# Patient Record
Sex: Female | Born: 1995 | Race: Black or African American | Hispanic: No | Marital: Single | Smoking: Former smoker
Health system: Southern US, Community
[De-identification: ages and names within clinical notes are randomized; demographics above are authoritative.]

## PROBLEM LIST (undated history)

## (undated) ENCOUNTER — Inpatient Hospital Stay (HOSPITAL_COMMUNITY): Payer: Self-pay

## (undated) DIAGNOSIS — S060X9A Concussion with loss of consciousness of unspecified duration, initial encounter: Secondary | ICD-10-CM

## (undated) DIAGNOSIS — E282 Polycystic ovarian syndrome: Secondary | ICD-10-CM

## (undated) DIAGNOSIS — E669 Obesity, unspecified: Secondary | ICD-10-CM

## (undated) DIAGNOSIS — S060XAA Concussion with loss of consciousness status unknown, initial encounter: Secondary | ICD-10-CM

## (undated) DIAGNOSIS — F419 Anxiety disorder, unspecified: Secondary | ICD-10-CM

## (undated) DIAGNOSIS — B9689 Other specified bacterial agents as the cause of diseases classified elsewhere: Secondary | ICD-10-CM

## (undated) DIAGNOSIS — S0291XA Unspecified fracture of skull, initial encounter for closed fracture: Secondary | ICD-10-CM

## (undated) DIAGNOSIS — F32A Depression, unspecified: Secondary | ICD-10-CM

## (undated) DIAGNOSIS — N390 Urinary tract infection, site not specified: Secondary | ICD-10-CM

## (undated) DIAGNOSIS — N760 Acute vaginitis: Secondary | ICD-10-CM

## (undated) DIAGNOSIS — F329 Major depressive disorder, single episode, unspecified: Secondary | ICD-10-CM

## (undated) DIAGNOSIS — F410 Panic disorder [episodic paroxysmal anxiety] without agoraphobia: Secondary | ICD-10-CM

## (undated) DIAGNOSIS — R51 Headache: Secondary | ICD-10-CM

## (undated) HISTORY — DX: Anxiety disorder, unspecified: F41.9

## (undated) HISTORY — PX: FRACTURE SURGERY: SHX138

## (undated) HISTORY — DX: Obesity, unspecified: E66.9

## (undated) HISTORY — DX: Headache: R51

---
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
## 2013-01-12 ENCOUNTER — Emergency Department (HOSPITAL_COMMUNITY)
Admission: EM | Admit: 2013-01-12 | Discharge: 2013-01-12 | Disposition: A | Discharge status: Home / Self Care | Payer: Self-pay | Attending: Emergency Medicine | Admitting: Emergency Medicine

## 2013-01-12 ENCOUNTER — Emergency Department (HOSPITAL_COMMUNITY): Payer: Self-pay

## 2013-01-12 ENCOUNTER — Encounter (HOSPITAL_COMMUNITY): Payer: Self-pay

## 2013-01-12 DIAGNOSIS — Z87440 Personal history of urinary (tract) infections: Secondary | ICD-10-CM | POA: Insufficient documentation

## 2013-01-12 DIAGNOSIS — Z8659 Personal history of other mental and behavioral disorders: Secondary | ICD-10-CM | POA: Insufficient documentation

## 2013-01-12 DIAGNOSIS — J45909 Unspecified asthma, uncomplicated: Secondary | ICD-10-CM | POA: Insufficient documentation

## 2013-01-12 DIAGNOSIS — A499 Bacterial infection, unspecified: Secondary | ICD-10-CM | POA: Insufficient documentation

## 2013-01-12 DIAGNOSIS — B9689 Other specified bacterial agents as the cause of diseases classified elsewhere: Secondary | ICD-10-CM | POA: Insufficient documentation

## 2013-01-12 DIAGNOSIS — R102 Pelvic and perineal pain unspecified side: Secondary | ICD-10-CM

## 2013-01-12 DIAGNOSIS — N949 Unspecified condition associated with female genital organs and menstrual cycle: Secondary | ICD-10-CM | POA: Insufficient documentation

## 2013-01-12 DIAGNOSIS — E669 Obesity, unspecified: Secondary | ICD-10-CM | POA: Insufficient documentation

## 2013-01-12 DIAGNOSIS — Z3202 Encounter for pregnancy test, result negative: Secondary | ICD-10-CM | POA: Insufficient documentation

## 2013-01-12 DIAGNOSIS — R112 Nausea with vomiting, unspecified: Secondary | ICD-10-CM | POA: Insufficient documentation

## 2013-01-12 DIAGNOSIS — N760 Acute vaginitis: Secondary | ICD-10-CM

## 2013-01-12 DIAGNOSIS — F17200 Nicotine dependence, unspecified, uncomplicated: Secondary | ICD-10-CM | POA: Insufficient documentation

## 2013-01-12 LAB — CBC WITH DIFFERENTIAL/PLATELET
Basophils Absolute: 0 10*3/uL (ref 0.0–0.1)
Lymphocytes Relative: 36 % (ref 12–46)
Lymphs Abs: 1.9 10*3/uL (ref 0.7–4.0)
Neutro Abs: 3 10*3/uL (ref 1.7–7.7)
Neutrophils Relative %: 56 % (ref 43–77)
Platelets: 273 10*3/uL (ref 150–400)
RBC: 4.59 MIL/uL (ref 3.87–5.11)
WBC: 5.4 10*3/uL (ref 4.0–10.5)

## 2013-01-12 LAB — WET PREP, GENITAL: Trich, Wet Prep: NONE SEEN

## 2013-01-12 LAB — POCT I-STAT, CHEM 8
BUN: 17 mg/dL (ref 6–23)
Chloride: 110 mEq/L (ref 96–112)
HCT: 43 % (ref 36.0–46.0)
Sodium: 143 mEq/L (ref 135–145)
TCO2: 21 mmol/L (ref 0–100)

## 2013-01-12 LAB — URINE MICROSCOPIC-ADD ON

## 2013-01-12 LAB — URINALYSIS, ROUTINE W REFLEX MICROSCOPIC
Glucose, UA: NEGATIVE mg/dL
Leukocytes, UA: NEGATIVE
Nitrite: NEGATIVE
Protein, ur: 30 mg/dL — AB
pH: 5.5 (ref 5.0–8.0)

## 2013-01-12 LAB — POCT PREGNANCY, URINE: Preg Test, Ur: NEGATIVE

## 2013-01-12 MED ORDER — METRONIDAZOLE 500 MG PO TABS: 500.0000 mg | ORAL_TABLET | Freq: Two times a day (BID) | ORAL | Status: DC

## 2013-01-12 NOTE — ED Notes (Signed)
Pt c/o RLQ radiating to rt lower back x44month with n/v off and on, pt states "I just don't feel right"

## 2013-01-12 NOTE — ED Provider Notes (Signed)
History    CSN: [id number] Arrival date & time 01/12/13  1208  First MD Initiated Contact with Patient 01/12/13 1342     Chief Complaint  Patient presents with  . Abdominal Pain  . Nausea  . Emesis   (Consider location/radiation/quality/duration/timing/severity/associated sxs/prior Treatment) HPI Comments: Patient presents with right-sided pelvic pain. She states it's been gone for about 2 or 3 weeks. It's been intermittent and has been gradually worsening. She's had some intermittent nausea and vomiting and says she just doesn't feel very good. She denies any vaginal bleeding or discharge. She states she had similar symptoms in the past when she was pregnant but hasn't had them since then. She denies any urinary symptoms. She denies any fevers or chills. She describes it as an achy pain across her lower abdomen more on the right pelvic area.  Past Medical History  Diagnosis Date  . UTI (lower urinary tract infection)   . Asthma   . Obese   . Anxiety   . NWGNFAOZ(308.6)    Past Surgical History  Procedure Laterality Date  . Cesarean section    . Hernia repair    . Fracture surgery      thumb   Family History  Problem Relation Age of Onset  . Diabetes Mother   . Migraines Mother   . Heart murmur Brother   . Cancer Paternal Grandfather    History  Substance Use Topics  . Smoking status: Current Some Day Smoker    Types: Cigars  . Smokeless tobacco: Never Used  . Alcohol Use: No     Comment: Occasional   OB History   Grav Para Term Preterm Abortions TAB SAB Ect Mult Living   2 1             Review of Systems  Constitutional: Negative for fever, chills, diaphoresis and fatigue.  HENT: Negative for congestion, rhinorrhea and sneezing.   Eyes: Negative.   Respiratory: Negative for cough, chest tightness and shortness of breath.   Cardiovascular: Negative for chest pain and leg swelling.  Gastrointestinal: Positive for nausea and abdominal pain. Negative for  vomiting, diarrhea and blood in stool.  Genitourinary: Positive for pelvic pain. Negative for frequency, hematuria, flank pain and difficulty urinating.  Musculoskeletal: Negative for back pain and arthralgias.  Skin: Negative for rash.  Neurological: Negative for dizziness, speech difficulty, weakness, numbness and headaches.    Allergies  Review of patient's allergies indicates no known allergies.  Home Medications   Current Outpatient Rx  Name  Route  Sig  Dispense  Refill  . albuterol (PROVENTIL HFA;VENTOLIN HFA) 108 (90 BASE) MCG/ACT inhaler   Inhalation   Inhale 2 puffs into the lungs every 6 (six) hours as needed. Shortness of breath         . etonogestrel (IMPLANON) 68 MG IMPL implant   Subcutaneous   Inject 1 each into the skin once.         Marland Kitchen ibuprofen (ADVIL,MOTRIN) 200 MG tablet   Oral   Take 400 mg by mouth every 6 (six) hours as needed for pain.         . metroNIDAZOLE (FLAGYL) 500 MG tablet   Oral   Take 1 tablet (500 mg total) by mouth 2 (two) times daily. One po bid x 7 days   14 tablet   0    BP 120/74  Pulse 72  Temp(Src) 98.5 F (36.9 C) (Oral)  Resp 16  SpO2 100% Physical Exam  Constitutional:  She is oriented to person, place, and time. She appears well-developed and well-nourished.  HENT:  Head: Normocephalic and atraumatic.  Eyes: Pupils are equal, round, and reactive to light.  Neck: Normal range of motion. Neck supple.  Cardiovascular: Normal rate, regular rhythm and normal heart sounds.   Pulmonary/Chest: Effort normal and breath sounds normal. No respiratory distress. She has no wheezes. She has no rales. She exhibits no tenderness.  Abdominal: Soft. Bowel sounds are normal. There is tenderness (Mild tenderness in the right lower pelvic area. There's no pain over McBurney's point.). There is no rebound and no guarding.  Genitourinary:  Patient with moderate amount of thick white discharge. There is no cervical motion tenderness. There  some mild right adnexal tenderness.  Musculoskeletal: Normal range of motion. She exhibits no edema.  Lymphadenopathy:    She has no cervical adenopathy.  Neurological: She is alert and oriented to person, place, and time.  Skin: Skin is warm and dry. No rash noted.  Psychiatric: She has a normal mood and affect.    ED Course  Procedures (including critical care time) Results for orders placed during the hospital encounter of 01/12/13  WET PREP, GENITAL      Result Value Range   Yeast Wet Prep HPF POC NONE SEEN  NONE SEEN   Trich, Wet Prep NONE SEEN  NONE SEEN   Clue Cells Wet Prep HPF POC MANY (*) NONE SEEN   WBC, Wet Prep HPF POC MODERATE (*) NONE SEEN  CBC WITH DIFFERENTIAL      Result Value Range   WBC 5.4  4.0 - 10.5 K/uL   RBC 4.59  3.87 - 5.11 MIL/uL   Hemoglobin 13.6  12.0 - 15.0 g/dL   HCT 81.1  91.4 - 78.2 %   MCV 90.6  78.0 - 100.0 fL   MCH 29.6  26.0 - 34.0 pg   MCHC 32.7  30.0 - 36.0 g/dL   RDW 95.6  21.3 - 08.6 %   Platelets 273  150 - 400 K/uL   Neutrophils Relative % 56  43 - 77 %   Neutro Abs 3.0  1.7 - 7.7 K/uL   Lymphocytes Relative 36  12 - 46 %   Lymphs Abs 1.9  0.7 - 4.0 K/uL   Monocytes Relative 6  3 - 12 %   Monocytes Absolute 0.3  0.1 - 1.0 K/uL   Eosinophils Relative 2  0 - 5 %   Eosinophils Absolute 0.1  0.0 - 0.7 K/uL   Basophils Relative 1  0 - 1 %   Basophils Absolute 0.0  0.0 - 0.1 K/uL  URINALYSIS, ROUTINE W REFLEX MICROSCOPIC      Result Value Range   Color, Urine YELLOW  YELLOW   APPearance CLEAR  CLEAR   Specific Gravity, Urine 1.031 (*) 1.005 - 1.030   pH 5.5  5.0 - 8.0   Glucose, UA NEGATIVE  NEGATIVE mg/dL   Hgb urine dipstick TRACE (*) NEGATIVE   Bilirubin Urine NEGATIVE  NEGATIVE   Ketones, ur NEGATIVE  NEGATIVE mg/dL   Protein, ur 30 (*) NEGATIVE mg/dL   Urobilinogen, UA 0.2  0.0 - 1.0 mg/dL   Nitrite NEGATIVE  NEGATIVE   Leukocytes, UA NEGATIVE  NEGATIVE  URINE MICROSCOPIC-ADD ON      Result Value Range   Squamous  Epithelial / LPF FEW (*) RARE   RBC / HPF 0-2  <3 RBC/hpf   Bacteria, UA FEW (*) RARE   Urine-Other MUCOUS PRESENT  POCT I-STAT, CHEM 8      Result Value Range   Sodium 143  135 - 145 mEq/L   Potassium 4.2  3.5 - 5.1 mEq/L   Chloride 110  96 - 112 mEq/L   BUN 17  6 - 23 mg/dL   Creatinine, Ser 1.61  0.50 - 1.10 mg/dL   Glucose, Bld 096 (*) 70 - 99 mg/dL   Calcium, Ion 0.45  4.09 - 1.23 mmol/L   TCO2 21  0 - 100 mmol/L   Hemoglobin 14.6  12.0 - 15.0 g/dL   HCT 81.1  91.4 - 78.2 %  POCT PREGNANCY, URINE      Result Value Range   Preg Test, Ur NEGATIVE  NEGATIVE   No results found.   US Transvaginal Non-ob  01/12/2013   *RADIOLOGY REPORT*  Clinical Data:  Right pelvic pain.  Clinical concern for ovarian torsion.  TRANSABDOMINAL AND TRANSVAGINAL ULTRASOUND OF PELVIS DOPPLER ULTRASOUND OF OVARIES  Technique:  Both transabdominal and transvaginal ultrasound examinations of the pelvis were performed. Transabdominal technique was performed for global imaging of the pelvis including uterus, ovaries, adnexal regions, and pelvic cul-de-sac.  It was necessary to proceed with endovaginal exam following the transabdominal exam to visualize the left ovary and better visualize the uterus and right ovary.  Color and duplex Doppler ultrasound was utilized to evaluate blood flow to the ovaries.  Comparison: Obstetrical ultrasound dated 07/05/2011.  Findings:  Uterus:  Normal in size and appearance  Endometrium: Normal in size and appearance  Right ovary: Normal appearance/no adnexal mass  Left ovary: Normal appearance/no adnexal mass  Pulsed Doppler evaluation demonstrates normal low-resistance arterial and venous waveforms in both ovaries.  IMPRESSION:  Normal examination.  No sonographic evidence for ovarian torsion.   Original Report Authenticated By: Beckie Salts, M.D.   US Pelvis Complete  01/12/2013   *RADIOLOGY REPORT*  Clinical Data:  Right pelvic pain.  Clinical concern for ovarian torsion.   TRANSABDOMINAL AND TRANSVAGINAL ULTRASOUND OF PELVIS DOPPLER ULTRASOUND OF OVARIES  Technique:  Both transabdominal and transvaginal ultrasound examinations of the pelvis were performed. Transabdominal technique was performed for global imaging of the pelvis including uterus, ovaries, adnexal regions, and pelvic cul-de-sac.  It was necessary to proceed with endovaginal exam following the transabdominal exam to visualize the left ovary and better visualize the uterus and right ovary.  Color and duplex Doppler ultrasound was utilized to evaluate blood flow to the ovaries.  Comparison: Obstetrical ultrasound dated 07/05/2011.  Findings:  Uterus:  Normal in size and appearance  Endometrium: Normal in size and appearance  Right ovary: Normal appearance/no adnexal mass  Left ovary: Normal appearance/no adnexal mass  Pulsed Doppler evaluation demonstrates normal low-resistance arterial and venous waveforms in both ovaries.  IMPRESSION:  Normal examination.  No sonographic evidence for ovarian torsion.   Original Report Authenticated By: Beckie Salts, M.D.   [place] Art/ven Flow Abd Pelv Doppler  01/12/2013   *RADIOLOGY REPORT*  Clinical Data:  Right pelvic pain.  Clinical concern for ovarian torsion.  TRANSABDOMINAL AND TRANSVAGINAL ULTRASOUND OF PELVIS DOPPLER ULTRASOUND OF OVARIES  Technique:  Both transabdominal and transvaginal ultrasound examinations of the pelvis were performed. Transabdominal technique was performed for global imaging of the pelvis including uterus, ovaries, adnexal regions, and pelvic cul-de-sac.  It was necessary to proceed with endovaginal exam following the transabdominal exam to visualize the left ovary and better visualize the uterus and right ovary.  Color and duplex Doppler ultrasound was utilized to evaluate blood flow to  the ovaries.  Comparison: Obstetrical ultrasound dated 07/05/2011.  Findings:  Uterus:  Normal in size and appearance  Endometrium: Normal in size and appearance  Right ovary:  Normal appearance/no adnexal mass  Left ovary: Normal appearance/no adnexal mass  Pulsed Doppler evaluation demonstrates normal low-resistance arterial and venous waveforms in both ovaries.  IMPRESSION:  Normal examination.  No sonographic evidence for ovarian torsion.   Original Report Authenticated By: Beckie Salts, M.D.   1. Pelvic pain   2. Bacterial vaginosis     MDM  There is no evidence of ovarian torsion or cyst. She was discharged in good condition. She was given a prescription for Flagyl for the bacterial vaginosis. She was advised to followup with the [facility] outpatient center.  Rolan Bucco, MD 01/12/13 1540

## 2013-01-13 LAB — GC/CHLAMYDIA PROBE AMP
CT Probe RNA: NEGATIVE
GC Probe RNA: NEGATIVE

## 2013-06-05 ENCOUNTER — Encounter (HOSPITAL_COMMUNITY): Payer: Self-pay | Admitting: Emergency Medicine

## 2013-06-05 ENCOUNTER — Emergency Department (HOSPITAL_COMMUNITY)
Admission: EM | Admit: 2013-06-05 | Discharge: 2013-06-05 | Disposition: A | Discharge status: Home / Self Care | Payer: Medicaid Other | Attending: Emergency Medicine | Admitting: Emergency Medicine

## 2013-06-05 DIAGNOSIS — F411 Generalized anxiety disorder: Secondary | ICD-10-CM | POA: Insufficient documentation

## 2013-06-05 DIAGNOSIS — R110 Nausea: Secondary | ICD-10-CM | POA: Insufficient documentation

## 2013-06-05 DIAGNOSIS — J45909 Unspecified asthma, uncomplicated: Secondary | ICD-10-CM | POA: Insufficient documentation

## 2013-06-05 DIAGNOSIS — E669 Obesity, unspecified: Secondary | ICD-10-CM | POA: Insufficient documentation

## 2013-06-05 DIAGNOSIS — R1032 Left lower quadrant pain: Secondary | ICD-10-CM | POA: Insufficient documentation

## 2013-06-05 DIAGNOSIS — Z87440 Personal history of urinary (tract) infections: Secondary | ICD-10-CM | POA: Insufficient documentation

## 2013-06-05 DIAGNOSIS — Z3202 Encounter for pregnancy test, result negative: Secondary | ICD-10-CM | POA: Insufficient documentation

## 2013-06-05 DIAGNOSIS — F17200 Nicotine dependence, unspecified, uncomplicated: Secondary | ICD-10-CM | POA: Insufficient documentation

## 2013-06-05 LAB — URINALYSIS, ROUTINE W REFLEX MICROSCOPIC
Bilirubin Urine: NEGATIVE
Glucose, UA: NEGATIVE mg/dL
Ketones, ur: NEGATIVE mg/dL
Nitrite: NEGATIVE
Protein, ur: NEGATIVE mg/dL
pH: 7.5 (ref 5.0–8.0)

## 2013-06-05 MED ORDER — ONDANSETRON 4 MG PO TBDP: 4.0000 mg | ORAL_TABLET | Freq: Three times a day (TID) | ORAL | Status: DC | PRN

## 2013-06-05 MED ORDER — TRAMADOL HCL 50 MG PO TABS: 50.0000 mg | ORAL_TABLET | Freq: Four times a day (QID) | ORAL | Status: DC | PRN

## 2013-06-05 NOTE — ED Notes (Signed)
She c/o llq area abd. Pain for ~ 1 1/2 weeks, which is made worse with activity.  She further tells me she has had some minimal vag. "spotting".  She states she has implant contraception.

## 2013-06-05 NOTE — ED Provider Notes (Signed)
Medical screening examination/treatment/procedure(s) were performed by non-physician practitioner and as supervising physician I was immediately available for consultation/collaboration.  EKG Interpretation   None         Karel Mowers L Angelos Wasco, MD 06/05/13 1457 

## 2013-06-05 NOTE — ED Provider Notes (Signed)
CSN: [id number]     Arrival date & time 06/05/13  0831 History   First MD Initiated Contact with Patient 06/05/13 [phone]     Chief Complaint  Patient presents with  . Abdominal Pain   (Consider location/radiation/quality/duration/timing/severity/associated sxs/prior Treatment) The history is provided by the patient and medical records.   This is a 17 year old female with no significant past medical history, presenting to the ED for left lower abdominal wall and groin pain for the past week.  States it feels like a "soreness" and radiates to her left anterior thigh. 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 She denies any specific injury but states sx are worse with these activities.  She denies any urinary symptoms or vaginal complaints. Patient does note a few days ago she had some menstrual "spotting".  Pt is currently has implanon and states it regulates her cycles very well.  Notes some intermittent nausea as well, no vomiting.  States it seems to improve once she eats.  No sick contacts, fevers, sweats, or chills.  Past Medical History  Diagnosis Date  . UTI (lower urinary tract infection)   . Asthma   . Obese   . Anxiety   . [id number](875.6)    Past Surgical History  Procedure Laterality Date  . Cesarean section    . Hernia repair    . Fracture surgery      thumb   Family History  Problem Relation Age of Onset  . Diabetes Mother   . Migraines Mother   . Heart murmur Brother   . Cancer Paternal Grandfather    History  Substance Use Topics  . Smoking status: Current Some Day Smoker    Types: Cigars  . Smokeless tobacco: Never Used  . Alcohol Use: No     Comment: Occasional   OB History   Grav Para Term Preterm Abortions TAB SAB Ect Mult Living   2 1             Review of Systems  Gastrointestinal: Positive for abdominal pain.  All other systems reviewed and are negative.    Allergies    Review of patient's allergies indicates no known allergies.  Home Medications   Current Outpatient Rx  Name  Route  Sig  Dispense  Refill  . albuterol (PROVENTIL HFA;VENTOLIN HFA) 108 (90 BASE) MCG/ACT inhaler   Inhalation   Inhale 2 puffs into the lungs every 6 (six) hours as needed. Shortness of breath         . aspirin-acetaminophen-caffeine (EXCEDRIN MIGRAINE) 250-250-65 MG per tablet   Oral   Take 1 tablet by mouth every 6 (six) hours as needed for headache.         . etonogestrel (IMPLANON) 68 MG IMPL implant   Subcutaneous   Inject 1 each into the skin once.         Marland Kitchen ibuprofen (ADVIL,MOTRIN) 200 MG tablet   Oral   Take 400 mg by mouth every 6 (six) hours as needed for pain.         Marland Kitchen ondansetron (ZOFRAN ODT) 4 MG disintegrating tablet   Oral   Take 1 tablet (4 mg total) by mouth every 8 (eight) hours as needed for nausea.   10 tablet   0   . traMADol (ULTRAM) 50 MG tablet   Oral   Take 1 tablet (50 mg total) by mouth every 6 (six) hours  as needed.   15 tablet   0    BP 127/69  Pulse 77  Temp(Src) 98.4 F (36.9 C)  Resp 16  SpO2 99%  Physical Exam  Nursing note and vitals reviewed. Constitutional: She is oriented to person, place, and time. She appears well-developed and well-nourished. No distress.  HENT:  Head: Normocephalic and atraumatic.  Mouth/Throat: Oropharynx is clear and moist.  Eyes: Conjunctivae and EOM are normal. Pupils are equal, round, and reactive to light.  Neck: Normal range of motion. Neck supple.  Cardiovascular: Normal rate, regular rhythm and normal heart sounds.   Pulmonary/Chest: Effort normal and breath sounds normal. No respiratory distress. She has no wheezes.  Abdominal: Soft. Bowel sounds are normal. No hernia. Hernia confirmed negative in the left inguinal area.    TTP along left lower abdominal wall and groin with extension into left anterior thigh; no hernia or masses; no lymphadenopathy; no redness or cellulitis   Musculoskeletal: Normal range of motion. She exhibits no edema.  Lymphadenopathy:       Left: No inguinal adenopathy present.  Neurological: She is alert and oriented to person, place, and time.  Skin: Skin is warm and dry. She is not diaphoretic.  Psychiatric: She has a normal mood and affect.    ED Course  Procedures (including critical care time) Labs Review Labs Reviewed  URINALYSIS, ROUTINE W REFLEX MICROSCOPIC  POCT PREGNANCY, URINE   Imaging Review No results found.  EKG Interpretation   None       MDM   1. Abdominal wall pain in left lower quadrant    Pt with pain along left lower abdomen/groin area.  No hernia or lymphadenopathy felt.  No urinary sx or pelvic complaints. u/a without signs of infection.  Intermittent nausea without clear etiology.  Sx possibly due to muscle strain from repetitive lifting and prolonged walking at work.  Long discussion with pt about further work-up including lab work +/- pelvic exam, pt elected to treat sx and will FU if symptoms persist or worsen.  Rx tramadol and zofran.  Discussed plan with pt, she agreed.  Return precautions advised.  Garlon Hatchet, PA-C 06/05/13 1226

## 2013-06-30 ENCOUNTER — Emergency Department (HOSPITAL_COMMUNITY)
Admission: EM | Admit: 2013-06-30 | Discharge: 2013-06-30 | Disposition: A | Discharge status: Home / Self Care | Payer: Medicaid Other | Attending: Emergency Medicine | Admitting: Emergency Medicine

## 2013-06-30 ENCOUNTER — Encounter (HOSPITAL_COMMUNITY): Payer: Self-pay | Admitting: Emergency Medicine

## 2013-06-30 DIAGNOSIS — F17200 Nicotine dependence, unspecified, uncomplicated: Secondary | ICD-10-CM | POA: Insufficient documentation

## 2013-06-30 DIAGNOSIS — F411 Generalized anxiety disorder: Secondary | ICD-10-CM | POA: Insufficient documentation

## 2013-06-30 DIAGNOSIS — J45901 Unspecified asthma with (acute) exacerbation: Secondary | ICD-10-CM | POA: Insufficient documentation

## 2013-06-30 DIAGNOSIS — E669 Obesity, unspecified: Secondary | ICD-10-CM | POA: Insufficient documentation

## 2013-06-30 DIAGNOSIS — Z79899 Other long term (current) drug therapy: Secondary | ICD-10-CM | POA: Insufficient documentation

## 2013-06-30 DIAGNOSIS — B9789 Other viral agents as the cause of diseases classified elsewhere: Secondary | ICD-10-CM | POA: Insufficient documentation

## 2013-06-30 DIAGNOSIS — Z87440 Personal history of urinary (tract) infections: Secondary | ICD-10-CM | POA: Insufficient documentation

## 2013-06-30 DIAGNOSIS — R112 Nausea with vomiting, unspecified: Secondary | ICD-10-CM | POA: Insufficient documentation

## 2013-06-30 DIAGNOSIS — J45909 Unspecified asthma, uncomplicated: Secondary | ICD-10-CM

## 2013-06-30 DIAGNOSIS — J069 Acute upper respiratory infection, unspecified: Secondary | ICD-10-CM | POA: Insufficient documentation

## 2013-06-30 DIAGNOSIS — R197 Diarrhea, unspecified: Secondary | ICD-10-CM | POA: Insufficient documentation

## 2013-06-30 DIAGNOSIS — B349 Viral infection, unspecified: Secondary | ICD-10-CM

## 2013-06-30 HISTORY — DX: Panic disorder (episodic paroxysmal anxiety): F41.0

## 2013-06-30 MED ORDER — PROMETHAZINE HCL 25 MG PO TABS: 25.0000 mg | ORAL_TABLET | Freq: Four times a day (QID) | ORAL | Status: DC | PRN

## 2013-06-30 MED ORDER — ALBUTEROL SULFATE HFA 108 (90 BASE) MCG/ACT IN AERS: 2.0000 | INHALATION_SPRAY | RESPIRATORY_TRACT | Status: DC | PRN

## 2013-06-30 MED ORDER — DIPHENOXYLATE-ATROPINE 2.5-0.025 MG PO TABS: 2.0000 | ORAL_TABLET | Freq: Four times a day (QID) | ORAL | Status: DC | PRN

## 2013-06-30 MED ORDER — PREDNISONE 20 MG PO TABS: ORAL_TABLET | ORAL | Status: DC

## 2013-06-30 MED ORDER — ALBUTEROL SULFATE HFA 108 (90 BASE) MCG/ACT IN AERS
2.0000 | INHALATION_SPRAY | Freq: Once | RESPIRATORY_TRACT | Status: AC
  Administered 2013-06-30: 2 via RESPIRATORY_TRACT
  Filled 2013-06-30: qty 6.7

## 2013-06-30 NOTE — ED Provider Notes (Signed)
CSN: [id number]     Arrival date & time 06/30/13  4098 History   First MD Initiated Contact with Patient 06/30/13 0700     Chief Complaint  Patient presents with  . Shortness of Breath   (Consider location/radiation/quality/duration/timing/severity/associated sxs/prior Treatment) HPI Comments: Patient has been sick for several days with sinus congestion and cough. She has had nausea with one episode of emesis and has had multiple episodes of diarrhea. Patient reports that her entire family is sick with similar symptoms. This morning, she started having wheezing and could not find her inhaler. She does have a history of asthma.  Patient reports a history of anxiety and panic attack. She has been experiencing increased anxiety this morning. She does not take medication for this. Symptoms are now improving.  Patient is a 17 y.o. female presenting with shortness of breath.  Shortness of Breath Associated symptoms: cough and wheezing     Past Medical History  Diagnosis Date  . UTI (lower urinary tract infection)   . Asthma   . Obese   . Anxiety   . Headache(784.0)   . Panic    Past Surgical History  Procedure Laterality Date  . Cesarean section    . Hernia repair    . Fracture surgery      thumb   Family History  Problem Relation Age of Onset  . Diabetes Mother   . Migraines Mother   . Heart murmur Brother   . Cancer Paternal Grandfather    History  Substance Use Topics  . Smoking status: Current Some Day Smoker    Types: Cigars  . Smokeless tobacco: Never Used  . Alcohol Use: No     Comment: Occasional   OB History   Grav Para Term Preterm Abortions TAB SAB Ect Mult Living   2 1             Review of Systems  HENT: Positive for sinus pressure.   Respiratory: Positive for cough, shortness of breath and wheezing.   All other systems reviewed and are negative.    Allergies  Review of patient's allergies indicates no known allergies.  Home Medications    Current Outpatient Rx  Name  Route  Sig  Dispense  Refill  . albuterol (PROVENTIL HFA;VENTOLIN HFA) 108 (90 BASE) MCG/ACT inhaler   Inhalation   Inhale 2 puffs into the lungs every 6 (six) hours as needed. Shortness of breath         . aspirin-acetaminophen-caffeine (EXCEDRIN MIGRAINE) 250-250-65 MG per tablet   Oral   Take 1 tablet by mouth every 6 (six) hours as needed for headache.         . etonogestrel (IMPLANON) 68 MG IMPL implant   Subcutaneous   Inject 1 each into the skin once.         Marland Kitchen ibuprofen (ADVIL,MOTRIN) 200 MG tablet   Oral   Take 400 mg by mouth every 6 (six) hours as needed for pain.         Marland Kitchen ondansetron (ZOFRAN ODT) 4 MG disintegrating tablet   Oral   Take 1 tablet (4 mg total) by mouth every 8 (eight) hours as needed for nausea.   10 tablet   0   . traMADol (ULTRAM) 50 MG tablet   Oral   Take 1 tablet (50 mg total) by mouth every 6 (six) hours as needed.   15 tablet   0    BP 146/88  Pulse 88  Temp(Src) 98 F (  36.7 C) (Oral)  Resp 18  Ht 5\' 8"  (1.727 m)  Wt 196 lb (88.905 kg)  BMI 29.81 kg/m2  SpO2 98% Physical Exam  Constitutional: She is oriented to person, place, and time. She appears well-developed and well-nourished. No distress.  HENT:  Head: Normocephalic and atraumatic.  Right Ear: Hearing normal.  Left Ear: Hearing normal.  Nose: Mucosal edema and rhinorrhea present. Right sinus exhibits maxillary sinus tenderness. Left sinus exhibits maxillary sinus tenderness.  Mouth/Throat: Oropharynx is clear and moist and mucous membranes are normal.  Eyes: Conjunctivae and EOM are normal. Pupils are equal, round, and reactive to light.  Neck: Normal range of motion. Neck supple.  Cardiovascular: Regular rhythm, S1 normal and S2 normal.  Exam reveals no gallop and no friction rub.   No murmur heard. Pulmonary/Chest: Effort normal and breath sounds normal. No respiratory distress. She exhibits no tenderness.  Abdominal: Soft. Normal  appearance and bowel sounds are normal. There is no hepatosplenomegaly. There is no tenderness. There is no rebound, no guarding, no tenderness at McBurney's point and negative Murphy's sign. No hernia.  Musculoskeletal: Normal range of motion.  Neurological: She is alert and oriented to person, place, and time. She has normal strength. No cranial nerve deficit or sensory deficit. Coordination normal. GCS eye subscore is 4. GCS verbal subscore is 5. GCS motor subscore is 6.  Skin: Skin is warm, dry and intact. No rash noted. No cyanosis.  Psychiatric: Her speech is normal and behavior is normal. Thought content normal. Her mood appears anxious.    ED Course  Procedures (including critical care time) Labs Review Labs Reviewed - No data to display Imaging Review No results found.  EKG Interpretation   None       MDM   1. URI (upper respiratory infection)   2. Viral syndrome   3. Asthma   4.  Anxiety  Patient presents to the year of multiple complaints. Patient's main complaint is shortness of breath and she does have a history of asthma. She had slight wheezing at arrival, but this resolved after albuterol. Patient has some sinus and upper respiratory infection symptoms. This is likely exacerbating the asthma. She has been without her Heller, has been provided 1. Patient has had viral syndrome symptoms including nausea, vomiting and diarrhea. She has multiple sick contacts at home with similar symptoms. Patient treated symptomatically. It is nontender to any dehydration. This have a history of anxiety and initially was very anxious on arrival, but has come down without intervention.    Gilda Crease, MD 06/30/13 [phone]

## 2013-06-30 NOTE — ED Notes (Signed)
Pt c/o difficulty breathing onset last night, pt states she has been out of her MDI for a few days. Pt tearful, very anxious. Pt also c/o emesis x 1 and multiple episodes of diarrhea.

## 2014-03-16 ENCOUNTER — Encounter (HOSPITAL_COMMUNITY): Payer: Self-pay | Admitting: Emergency Medicine

## 2014-03-16 ENCOUNTER — Emergency Department (HOSPITAL_COMMUNITY)
Admission: EM | Admit: 2014-03-16 | Discharge: 2014-03-16 | Disposition: A | Discharge status: Home / Self Care | Payer: Medicaid Other | Attending: Emergency Medicine | Admitting: Emergency Medicine

## 2014-03-16 DIAGNOSIS — Z79899 Other long term (current) drug therapy: Secondary | ICD-10-CM | POA: Insufficient documentation

## 2014-03-16 DIAGNOSIS — R079 Chest pain, unspecified: Secondary | ICD-10-CM | POA: Insufficient documentation

## 2014-03-16 DIAGNOSIS — R42 Dizziness and giddiness: Secondary | ICD-10-CM | POA: Insufficient documentation

## 2014-03-16 DIAGNOSIS — E669 Obesity, unspecified: Secondary | ICD-10-CM | POA: Insufficient documentation

## 2014-03-16 DIAGNOSIS — Z3202 Encounter for pregnancy test, result negative: Secondary | ICD-10-CM | POA: Insufficient documentation

## 2014-03-16 DIAGNOSIS — K209 Esophagitis, unspecified without bleeding: Secondary | ICD-10-CM | POA: Insufficient documentation

## 2014-03-16 DIAGNOSIS — J45909 Unspecified asthma, uncomplicated: Secondary | ICD-10-CM | POA: Insufficient documentation

## 2014-03-16 DIAGNOSIS — Z87891 Personal history of nicotine dependence: Secondary | ICD-10-CM | POA: Insufficient documentation

## 2014-03-16 DIAGNOSIS — F410 Panic disorder [episodic paroxysmal anxiety] without agoraphobia: Secondary | ICD-10-CM | POA: Insufficient documentation

## 2014-03-16 DIAGNOSIS — Z8669 Personal history of other diseases of the nervous system and sense organs: Secondary | ICD-10-CM | POA: Insufficient documentation

## 2014-03-16 DIAGNOSIS — IMO0002 Reserved for concepts with insufficient information to code with codable children: Secondary | ICD-10-CM | POA: Insufficient documentation

## 2014-03-16 DIAGNOSIS — Z87440 Personal history of urinary (tract) infections: Secondary | ICD-10-CM | POA: Insufficient documentation

## 2014-03-16 LAB — CBC
HCT: 40.6 % (ref 36.0–46.0)
Hemoglobin: 13.2 g/dL (ref 12.0–15.0)
MCH: 30.2 pg (ref 26.0–34.0)
MCHC: 32.5 g/dL (ref 30.0–36.0)
MCV: 92.9 fL (ref 78.0–100.0)
PLATELETS: 246 10*3/uL (ref 150–400)
RBC: 4.37 MIL/uL (ref 3.87–5.11)
RDW: 12 % (ref 11.5–15.5)
WBC: 6.3 10*3/uL (ref 4.0–10.5)

## 2014-03-16 LAB — BASIC METABOLIC PANEL
ANION GAP: 12 (ref 5–15)
BUN: 9 mg/dL (ref 6–23)
CALCIUM: 9.6 mg/dL (ref 8.4–10.5)
CO2: 24 meq/L (ref 19–32)
Chloride: 102 mEq/L (ref 96–112)
Creatinine, Ser: 0.93 mg/dL (ref 0.50–1.10)
GFR, EST NON AFRICAN AMERICAN: 88 mL/min — AB (ref >90)
Glucose, Bld: 81 mg/dL (ref 70–99)
Potassium: 3.9 mEq/L (ref 3.7–5.3)
SODIUM: 138 meq/L (ref 137–147)

## 2014-03-16 LAB — POC URINE PREG, ED: PREG TEST UR: NEGATIVE

## 2014-03-16 LAB — I-STAT TROPONIN, ED: TROPONIN I, POC: 0 ng/mL (ref 0.00–0.08)

## 2014-03-16 MED ORDER — GI COCKTAIL ~~LOC~~
30.0000 mL | Freq: Once | ORAL | Status: AC
  Administered 2014-03-16: 30 mL via ORAL
  Filled 2014-03-16: qty 30

## 2014-03-16 NOTE — ED Provider Notes (Signed)
CSN: [id number]     Arrival date & time 03/16/14  1508 History   First MD Initiated Contact with Patient 03/16/14 1805     Chief Complaint  Patient presents with  . Chest Pain  . Dizziness  . Nausea   (Consider location/radiation/quality/duration/timing/severity/associated sxs/prior Treatment) HPI  Jodi Daniel is a 18 yo female with PMH: asthma, migraines, presenting to the ED with chest pain x 3 days.  Pt states pain is intermittent, comes on gradually and points to directly below the sternal notch and notes it radiates up to throat.  She reports some nausea and 1 episode of vomiting 2 days ago that has resolved.  She describes the pain as a pressure and rates it as 4/10.  She denies fever, chills, abd pain, diarrhea, cough or shortness of breath.  Past Medical History  Diagnosis Date  . UTI (lower urinary tract infection)   . Asthma   . Obese   . Anxiety   . Headache(784.0)   . Panic    Past Surgical History  Procedure Laterality Date  . Cesarean section    . Hernia repair    . Fracture surgery      thumb   Family History  Problem Relation Age of Onset  . Diabetes Mother   . Migraines Mother   . Heart murmur Brother   . Cancer Paternal Grandfather    History  Substance Use Topics  . Smoking status: Former Smoker    Types: Cigars  . Smokeless tobacco: Never Used  . Alcohol Use: Yes     Comment: Occasional   OB History   Grav Para Term Preterm Abortions TAB SAB Ect Mult Living   2 1             Review of Systems  Constitutional: Negative for fever and chills.  HENT: Negative for sore throat.   Respiratory: Negative for cough and shortness of breath.   Cardiovascular: Negative for chest pain and leg swelling.  Gastrointestinal: Negative for nausea, vomiting, abdominal pain and diarrhea.  Genitourinary: Negative for dysuria.  Musculoskeletal: Negative for myalgias.  Skin: Negative for rash.  Neurological: Negative for weakness, numbness and headaches.       Allergies  Review of patient's allergies indicates no known allergies.  Home Medications   Prior to Admission medications   Medication Sig Start Date End Date Taking? Authorizing Provider  albuterol (PROVENTIL HFA;VENTOLIN HFA) 108 (90 BASE) MCG/ACT inhaler Inhale 2 puffs into the lungs every 6 (six) hours as needed. Shortness of breath    Historical Provider, MD  albuterol (PROVENTIL HFA;VENTOLIN HFA) 108 (90 BASE) MCG/ACT inhaler Inhale 2 puffs into the lungs every 4 (four) hours as needed for wheezing or shortness of breath. 06/30/13   Gilda Crease, MD  aspirin-acetaminophen-caffeine (EXCEDRIN MIGRAINE) [phone] MG per tablet Take 1 tablet by mouth every 6 (six) hours as needed for headache.    Historical Provider, MD  diphenoxylate-atropine (LOMOTIL) 2.5-0.025 MG per tablet Take 2 tablets by mouth 4 (four) times daily as needed for diarrhea or loose stools. 06/30/13   Gilda Crease, MD  etonogestrel (IMPLANON) 68 MG IMPL implant Inject 1 each into the skin once.    Historical Provider, MD  ibuprofen (ADVIL,MOTRIN) 200 MG tablet Take 400 mg by mouth every 6 (six) hours as needed for pain.    Historical Provider, MD  ondansetron (ZOFRAN ODT) 4 MG disintegrating tablet Take 1 tablet (4 mg total) by mouth every 8 (eight) hours as needed for  nausea. 06/05/13   Garlon Hatchet, PA-C  predniSONE (DELTASONE) 20 MG tablet 3 tabs po day one, then 2 tabs daily x 4 days 06/30/13   Gilda Crease, MD  promethazine (PHENERGAN) 25 MG tablet Take 1 tablet (25 mg total) by mouth every 6 (six) hours as needed for nausea or vomiting. 06/30/13   Gilda Crease, MD  traMADol (ULTRAM) 50 MG tablet Take 1 tablet (50 mg total) by mouth every 6 (six) hours as needed. 06/05/13   Garlon Hatchet, PA-C   BP 112/74  Pulse 79  Temp(Src) 98.4 F (36.9 C) (Oral)  Resp 19  SpO2 100%  LMP 02/14/2014 Physical Exam  Nursing note and vitals reviewed. Constitutional: She appears  well-developed and well-nourished. No distress.  HENT:  Head: Normocephalic and atraumatic.  Mouth/Throat: Oropharynx is clear and moist. No oropharyngeal exudate.  Eyes: Conjunctivae are normal.  Neck: Neck supple. No thyromegaly present.  Cardiovascular: Normal rate, regular rhythm and intact distal pulses.   Pulmonary/Chest: Effort normal and breath sounds normal. No respiratory distress. She has no wheezes. She has no rales. She exhibits tenderness.    Abdominal: Soft. There is no tenderness.  Musculoskeletal: She exhibits no tenderness.  Lymphadenopathy:    She has no cervical adenopathy.  Neurological: She is alert. Coordination normal.  Skin: Skin is warm and dry. No rash noted. She is not diaphoretic.  Psychiatric: She has a normal mood and affect.    ED Course  Procedures (including critical care time) Labs Review Labs Reviewed  BASIC METABOLIC PANEL - Abnormal; Notable for the following:    GFR calc non Af Amer 88 (*)    All other components within normal limits  CBC  I-STAT TROPOININ, ED  POC URINE PREG, ED   Imaging Review No results found.   EKG Interpretation   Date/Time:  Thursday March 16 2014 15:20:03 EDT Ventricular Rate:  78 PR Interval:  157 QRS Duration: 79 QT Interval:  378 QTC Calculation: 430 R Axis:   82 Text Interpretation:  Sinus rhythm Borderline T wave abnormalities ECG  OTHERWISE WITHIN NORMAL LIMITS No old tracing to compare Confirmed by  MILLER  MD, BRIAN ([id number]) on 03/16/2014 3:30:00 PM      MDM   Final diagnoses:  Esophagitis   Pt's pain does not appear to be cardiac or pulmonary in nature based on presentation.  Pt's pain resolved with GI cocktail.  Her vital signs are normal, no tracheal deviation, no respiratory distress, EKG without acute abnormalities, negative troponin.  Discharge instructions include symptomatic treatment at home and follow-up with her primary care provider.  Discharge precautions provided and  Pt  appears reliable for follow up and is agreeable to discharge.   Filed Vitals:   03/16/14 1529 03/16/14 1758 03/16/14 1943  BP: 123/72 112/74 120/71  Pulse: 81 79 71  Temp: 98.4 F (36.9 C)    TempSrc: Oral    Resp: SpO2: 100% 100% 99%   Meds given in ED:  Medications  gi cocktail (Maalox,Lidocaine,Donnatal) (30 mLs Oral Given 03/16/14 1923)    Discharge Medication List as of 03/16/2014  7:37 PM      Harle Battiest, NP 03/17/14 0100  Harle Battiest, NP 03/17/14 0104

## 2014-03-16 NOTE — ED Notes (Signed)
Pt presents with c/o centralized chest pain that started approx 3 days ago. Pt reports dizziness with the pain as well as some nausea. Pt says she has vomited approx 2 times over the last few days.

## 2014-03-16 NOTE — Discharge Instructions (Signed)
Please follow the directions provided.  Please establish care with a primary care provider to follow this pain and manage your asthma.  You may take over the counter antacid medicine if this pain recurs.     SEEK IMMEDIATE MEDICAL ATTENTION IF: You develop a fever.  Your chest pains become severe or intolerable.  You develop new, unexplained symptoms (problems).  You develop shortness of breath, nausea, vomiting, sweating or feel light headed.  You develop a new cough or you cough up blood.   Emergency Department Resource Guide 1) Find a Doctor and Pay Out of Pocket Although you won't have to find out who is covered by your insurance plan, it is a good idea to ask around and get recommendations. You will then need to call the office and see if the doctor you have chosen will accept you as a new patient and what types of options they offer for patients who are self-pay. Some doctors offer discounts or will set up payment plans for their patients who do not have insurance, but you will need to ask so you aren't surprised when you get to your appointment.  2) Contact Your Local Health Department Not all health departments have doctors that can see patients for sick visits, but many do, so it is worth a call to see if yours does. If you don't know where your local health department is, you can check in your phone book. The CDC also has a tool to help you locate your state's health department, and many state websites also have listings of all of their local health departments.  3) Find a Walk-in Clinic If your illness is not likely to be very severe or complicated, you may want to try a walk in clinic. These are popping up all over the country in pharmacies, drugstores, and shopping centers. They're usually staffed by nurse practitioners or physician assistants that have been trained to treat common illnesses and complaints. They're usually fairly quick and inexpensive. However, if you have serious  medical issues or chronic medical problems, these are probably not your best option.  No Primary Care Doctor: - Call Health Connect at  [phone] - they can help you locate a primary care doctor that  accepts your insurance, provides certain services, etc. - Physician Referral Service- [phone]  Chronic Pain Problems: Organization         Address  Phone   Notes  [facility]  [phone] Patients need to be referred by their primary care doctor.   Medication Assistance: Organization         Address  Phone   Notes  [facility] Medication [facility] [address] [place] [place], [place] [address] [phone] --Must be a resident of [facility] -- Must have NO insurance coverage whatsoever (no Medicaid/ Medicare, etc.) -- The pt. MUST have a primary care doctor that directs their care regularly and follows them in the community   [facility]  [phone]   [employer]  [phone]    Agencies that provide inexpensive medical care: Organization         Address  Phone   Notes  [facility]  [phone]   [facility]    [phone]   [facility] [address] [place], [place] [address] [phone]   [facility] [address] [place] [place] [phone]   Planned Parenthood    [phone])  [phone]   [facility]    [phone]   [facility]  [address], [place] Phone:  [phone], Fax:  [phone] Hours of Operation:  9 am - 6 pm, M-F.  Also accepts Medicaid/Medicare and self-pay.  [facility]  [address], [place] Phone: [phone], Fax: [phone]. Hours of Operation:  8:30 am - 5:30 pm, M-F.  Also accepts Medicaid and self-pay.  [facility] [place] [address] [place] Phone: [phone]   [facility] [address] Natasha Bence  [place], [place] [phone], Ext. [phone] Mondays & Thursdays: 7-9 AM.  First 15 patients are seen on a first come, first serve basis.    Medicaid-accepting [facility] Providers:  Organization         Address  Phone   Notes  [facility] [address], [place] [phone] Also accepts self-pay patients.  [facility] [address] Laurell Josephs [place], [place]  [phone]   [facility] [address], [place] [phone]   [facility] [address] [place] [phone]   Renaye Rakers [address], [place]   [phone] Only accepts [place] Access [place] patients after they have their name applied to their card.   Self-Pay (no insurance) in [facility]:  Organization         Address  Phone   Notes  Sickle Cell Patients, [facility] [address] [place], [place] [phone]   [facility] [address] [place], [place] [phone]   [facility] [place]  [address], [place] [phone]   [facility]/Dr. Osei-Bonsu  [address], [place] or [address], [place] [phone] Phone number for both [place] and [place] locations is the same.  [facility] [address], [place] [phone]   [facility] [address], [place] or [address] [phone]   [facility] [address] [place] [phone], phone; [phone], fax Sees patients 1st and 3rd Saturday of every month.  Must not qualify for public or private insurance (i.e. Medicaid, Medicare, [place] Health Choice, Veterans' Benefits)  Household income should be no more than 200% of the poverty level The clinic cannot treat you if you are pregnant or think you are pregnant  Sexually  transmitted diseases are not treated at the clinic.    Dental Care: Organization         Address  Phone  Notes  [facility] [address] [place], [place] [phone] Accepts children up to age 29 who are enrolled in [place] or [place] Health Choice; pregnant women with a Medicaid card; and children who have applied for Medicaid or [place] Health Choice, but were declined, whose parents can pay a reduced fee at time of service.  [facility]  [address], [place] [phone] Accepts children up to age 40 who are enrolled in [place] or [place] Health Choice; pregnant women with a Medicaid card; and children who have applied for Medicaid or [place] Health Choice, but were declined, whose parents can pay a reduced  fee at time of service.  [facility]  [address] [place], [place] [phone] Patients are seen by appointment only. Walk-ins are not accepted. [facility] will see patients 32 years of age and older. Monday - Tuesday (8am-5pm) Most Wednesdays (8:30-5pm) $30 per visit, cash only  [facility]  [address], [facility] [phone] Patients are seen by appointment only. Walk-ins are not accepted. [facility] will see patients 62 years of age and older. One Wednesday Evening (Monthly: Volunteer Based).  $30 per visit, cash only  [employer]  [phone] for adults; Children under age 49, call Graduate Pediatric Dentistry at [phone]. Children aged 82-14, please call [phone] to request a pediatric application.  Dental services are provided in all areas of dental care including fillings, crowns and bridges, complete and partial dentures, implants, gum treatment, root canals, and extractions. Preventive care is also provided. Treatment is provided to both adults and children. Patients are  selected via a lottery and there is often a waiting list.   [facility] [address], [place]  [phone] [email or web address]   [facility] [address] [place], [place] [phone], Ext. [phone] Second and Fourth Thursday of each month, opens at 6:30 AM; Clinic ends at 9 AM.  Patients are seen on a first-come first-served basis, and a limited number are seen during each clinic.   [facility]  [address] Ether Griffins [place], [place] [phone]   Eligibility Requirements You must have lived in [place], [place], or [place] counties for at least the last three months.   You cannot be eligible for state or federal sponsored [place], including [employer], [place], or [employer].   You generally cannot be eligible for healthcare insurance through your employer.    How to apply: Eligibility screenings are held every Tuesday and Wednesday afternoon from 1:00 pm until 4:00 pm. You do not need an appointment for the interview!  [facility] [address], [place], [place] [phone]   [facility] Health Department  [phone]   [facility] Health Department  [phone]   [facility] Health Department  [phone]    Behavioral Health Resources in the Community: Intensive Outpatient Programs Organization         Address  Phone  Notes  [facility] [address], [place], [place] [phone]   [facility] Outpatient [address], [place], [place] [phone]   [facility] [address] [place], [place]  [phone]   [facility] [address]  [place], [place] [phone] or [phone]   Substance Abuse Resources Organization         Address  Phone  Notes  [facility]  [phone]   [facility]  [phone]   [facility]  [phone]   Floydene Flock  [phone]     Residential & Outpatient Substance Abuse Program  [phone]   Psychological Services Organization         Address  Phone  Notes  [facility] Behavioral Health  [phone]   [facility]  [phone]   [facility] [address] [place] [phone] or [phone]    Mobile Crisis Teams Organization         Address  Phone  Notes  [facility], Mobile Crisis Care Unit  [phone]  [facility]  [address] [place], [place]   [facility] [address] [place] [phone]    Self-Help/Support Groups Organization         Address  Phone             Notes  [facility] [place] - variety of support groups  [place] Call for more information  Narcotics Anonymous (NA), [facility] [address], [employer] [place]  2 meetings at this location   Special educational needs teacher         Address  Phone  Notes  [facility] [place],    [place]  [phone]   [facility]  [address] [place] [phone], [place], [place]   [place], [place] [phone] Admissions: 8am-3pm M-F  Incentives Substance [place] [address],    [place], [place] [phone]   [facility] [address] [place], [place], [place]   [facility] [address]  [place], [place]   [facility] - Intensive Outpatient [address] Kristeen Mans [address], [place], [place]   [facility] ([place] [place]  [place], [place] [phone] or [phone]   [facility] ([facility]) [address], [place], [place] Accepts Medicaid  [facility] [address]  [place] [phone] Substance Abuse/Addiction Treatment   [facility] Organization         Address  Phone  Notes  [facility]  [phone]   Domenic Schwab, PhD [address] Arlis Porta [place], [place]   [phone] or [phone]   [place], [place] [phone]   [facility] [address] [place], [place] [phone] Insurance/Medicaid/sponsorship through [facility] and Families [address]                                    [place], [place] [phone] [place] [address], [place] [phone]    Dr. Adele Schilder  [phone]   [facility] Dept. 1) [address], [place] 2) [place] 3)  [place] [address], [place] [phone]  [phone]   [facility] [phone] or [phone] (After Hours)

## 2014-03-17 NOTE — ED Provider Notes (Signed)
Medical screening examination/treatment/procedure(s) were performed by non-physician practitioner and as supervising physician I was immediately available for consultation/collaboration.    Vida Roller, MD 03/17/14 1106

## 2014-05-15 ENCOUNTER — Encounter (HOSPITAL_COMMUNITY): Payer: Self-pay | Admitting: Emergency Medicine

## 2014-06-23 ENCOUNTER — Emergency Department (HOSPITAL_COMMUNITY)
Admission: EM | Admit: 2014-06-23 | Discharge: 2014-06-24 | Disposition: A | Discharge status: Home / Self Care | Payer: Medicaid Other | Attending: Emergency Medicine | Admitting: Emergency Medicine

## 2014-06-23 ENCOUNTER — Encounter (HOSPITAL_COMMUNITY): Payer: Self-pay | Admitting: Emergency Medicine

## 2014-06-23 DIAGNOSIS — M546 Pain in thoracic spine: Secondary | ICD-10-CM

## 2014-06-23 DIAGNOSIS — Y92410 Unspecified street and highway as the place of occurrence of the external cause: Secondary | ICD-10-CM | POA: Insufficient documentation

## 2014-06-23 DIAGNOSIS — M542 Cervicalgia: Secondary | ICD-10-CM

## 2014-06-23 DIAGNOSIS — Z79899 Other long term (current) drug therapy: Secondary | ICD-10-CM | POA: Insufficient documentation

## 2014-06-23 DIAGNOSIS — Y998 Other external cause status: Secondary | ICD-10-CM | POA: Insufficient documentation

## 2014-06-23 DIAGNOSIS — E669 Obesity, unspecified: Secondary | ICD-10-CM | POA: Insufficient documentation

## 2014-06-23 DIAGNOSIS — R0781 Pleurodynia: Secondary | ICD-10-CM

## 2014-06-23 DIAGNOSIS — S0990XA Unspecified injury of head, initial encounter: Secondary | ICD-10-CM | POA: Insufficient documentation

## 2014-06-23 DIAGNOSIS — J45909 Unspecified asthma, uncomplicated: Secondary | ICD-10-CM | POA: Insufficient documentation

## 2014-06-23 DIAGNOSIS — Z87440 Personal history of urinary (tract) infections: Secondary | ICD-10-CM | POA: Insufficient documentation

## 2014-06-23 DIAGNOSIS — R519 Headache, unspecified: Secondary | ICD-10-CM

## 2014-06-23 DIAGNOSIS — F419 Anxiety disorder, unspecified: Secondary | ICD-10-CM | POA: Insufficient documentation

## 2014-06-23 DIAGNOSIS — Y9389 Activity, other specified: Secondary | ICD-10-CM | POA: Insufficient documentation

## 2014-06-23 DIAGNOSIS — R51 Headache: Secondary | ICD-10-CM

## 2014-06-23 DIAGNOSIS — Z87891 Personal history of nicotine dependence: Secondary | ICD-10-CM | POA: Insufficient documentation

## 2014-06-23 DIAGNOSIS — S199XXA Unspecified injury of neck, initial encounter: Secondary | ICD-10-CM | POA: Insufficient documentation

## 2014-06-23 DIAGNOSIS — Z3202 Encounter for pregnancy test, result negative: Secondary | ICD-10-CM | POA: Insufficient documentation

## 2014-06-23 DIAGNOSIS — S24109A Unspecified injury at unspecified level of thoracic spinal cord, initial encounter: Secondary | ICD-10-CM | POA: Insufficient documentation

## 2014-06-23 DIAGNOSIS — S299XXA Unspecified injury of thorax, initial encounter: Secondary | ICD-10-CM | POA: Insufficient documentation
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
## 2014-06-23 NOTE — ED Notes (Signed)
Patient called to treatment room x1, no answer.  

## 2014-06-24 ENCOUNTER — Emergency Department (HOSPITAL_COMMUNITY): Payer: Medicaid Other

## 2014-06-24 LAB — POC URINE PREG, ED: Preg Test, Ur: NEGATIVE

## 2014-06-24 MED ORDER — NAPROXEN 500 MG PO TABS: 500.0000 mg | ORAL_TABLET | Freq: Two times a day (BID) | ORAL | Status: DC

## 2014-06-24 MED ORDER — METHOCARBAMOL 500 MG PO TABS
1000.0000 mg | ORAL_TABLET | Freq: Once | ORAL | Status: AC
  Administered 2014-06-24: 1000 mg via ORAL
  Filled 2014-06-24: qty 2

## 2014-06-24 MED ORDER — KETOROLAC TROMETHAMINE 60 MG/2ML IM SOLN
30.0000 mg | Freq: Once | INTRAMUSCULAR | Status: AC
  Administered 2014-06-24: 30 mg via INTRAMUSCULAR
  Filled 2014-06-24: qty 2

## 2014-06-24 MED ORDER — METHOCARBAMOL 500 MG PO TABS: 500.0000 mg | ORAL_TABLET | Freq: Two times a day (BID) | ORAL | Status: DC | PRN

## 2014-06-24 MED ORDER — HYDROCODONE-ACETAMINOPHEN 5-325 MG PO TABS: 1.0000 | ORAL_TABLET | ORAL | Status: DC | PRN

## 2014-06-24 NOTE — ED Notes (Signed)
Pt unable to void at this time. Pt urinated previously, sample was unable to be obtained.
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
## 2014-07-03 ENCOUNTER — Encounter (HOSPITAL_COMMUNITY): Payer: Self-pay | Admitting: *Deleted

## 2014-07-03 ENCOUNTER — Emergency Department (HOSPITAL_COMMUNITY)
Admission: EM | Admit: 2014-07-03 | Discharge: 2014-07-03 | Disposition: A | Discharge status: Home / Self Care | Payer: Medicaid Other | Attending: Emergency Medicine | Admitting: Emergency Medicine

## 2014-07-03 DIAGNOSIS — N390 Urinary tract infection, site not specified: Secondary | ICD-10-CM | POA: Insufficient documentation

## 2014-07-03 DIAGNOSIS — N730 Acute parametritis and pelvic cellulitis: Secondary | ICD-10-CM

## 2014-07-03 DIAGNOSIS — Z87891 Personal history of nicotine dependence: Secondary | ICD-10-CM | POA: Insufficient documentation

## 2014-07-03 DIAGNOSIS — F419 Anxiety disorder, unspecified: Secondary | ICD-10-CM | POA: Diagnosis not present

## 2014-07-03 DIAGNOSIS — N739 Female pelvic inflammatory disease, unspecified: Secondary | ICD-10-CM | POA: Insufficient documentation

## 2014-07-03 DIAGNOSIS — Z791 Long term (current) use of non-steroidal anti-inflammatories (NSAID): Secondary | ICD-10-CM | POA: Insufficient documentation

## 2014-07-03 DIAGNOSIS — R300 Dysuria: Secondary | ICD-10-CM | POA: Diagnosis present

## 2014-07-03 DIAGNOSIS — Z3202 Encounter for pregnancy test, result negative: Secondary | ICD-10-CM | POA: Diagnosis not present

## 2014-07-03 DIAGNOSIS — E669 Obesity, unspecified: Secondary | ICD-10-CM | POA: Insufficient documentation

## 2014-07-03 DIAGNOSIS — Z79899 Other long term (current) drug therapy: Secondary | ICD-10-CM | POA: Insufficient documentation

## 2014-07-03 DIAGNOSIS — R1110 Vomiting, unspecified: Secondary | ICD-10-CM | POA: Diagnosis not present

## 2014-07-03 DIAGNOSIS — J45909 Unspecified asthma, uncomplicated: Secondary | ICD-10-CM | POA: Insufficient documentation

## 2014-07-03 LAB — URINALYSIS, ROUTINE W REFLEX MICROSCOPIC
Bilirubin Urine: NEGATIVE
Glucose, UA: NEGATIVE mg/dL
Ketones, ur: NEGATIVE mg/dL
NITRITE: NEGATIVE
PROTEIN: NEGATIVE mg/dL
SPECIFIC GRAVITY, URINE: 1.01 (ref 1.005–1.030)
Urobilinogen, UA: 0.2 mg/dL (ref 0.0–1.0)
pH: 7 (ref 5.0–8.0)

## 2014-07-03 LAB — URINE MICROSCOPIC-ADD ON

## 2014-07-03 LAB — POC URINE PREG, ED: PREG TEST UR: NEGATIVE

## 2014-07-03 LAB — WET PREP, GENITAL
Trich, Wet Prep: NONE SEEN
YEAST WET PREP: NONE SEEN

## 2014-07-03 MED ORDER — CEPHALEXIN 500 MG PO CAPS: 500.0000 mg | ORAL_CAPSULE | Freq: Three times a day (TID) | ORAL | Status: DC

## 2014-07-03 MED ORDER — CEPHALEXIN 500 MG PO CAPS
500.0000 mg | ORAL_CAPSULE | Freq: Once | ORAL | Status: AC
  Administered 2014-07-03: 500 mg via ORAL
  Filled 2014-07-03: qty 1

## 2014-07-03 MED ORDER — AZITHROMYCIN 250 MG PO TABS
1000.0000 mg | ORAL_TABLET | Freq: Once | ORAL | Status: AC
  Administered 2014-07-03: 1000 mg via ORAL
  Filled 2014-07-03: qty 4

## 2014-07-03 MED ORDER — LIDOCAINE HCL 1 % IJ SOLN
INTRAMUSCULAR | Status: AC
  Administered 2014-07-03: 2 mL
  Filled 2014-07-03: qty 20

## 2014-07-03 MED ORDER — DOXYCYCLINE HYCLATE 100 MG PO TABS: 100.0000 mg | ORAL_TABLET | Freq: Two times a day (BID) | ORAL | Status: DC

## 2014-07-03 MED ORDER — CEFTRIAXONE SODIUM 250 MG IJ SOLR
250.0000 mg | Freq: Once | INTRAMUSCULAR | Status: AC
  Administered 2014-07-03: 250 mg via INTRAMUSCULAR
  Filled 2014-07-03: qty 250

## 2014-07-03 MED ORDER — DOXYCYCLINE HYCLATE 100 MG PO TABS
100.0000 mg | ORAL_TABLET | Freq: Once | ORAL | Status: AC
  Administered 2014-07-03: 100 mg via ORAL
  Filled 2014-07-03: qty 1

## 2014-07-03 NOTE — ED Provider Notes (Signed)
CSN: [id number]     Arrival date & time 07/03/14  1827 History   First MD Initiated Contact with Patient 07/03/14 1914     Chief Complaint  Patient presents with  . Dysuria     (Consider location/radiation/quality/duration/timing/severity/associated sxs/prior Treatment) HPI Comments: Pelvic pain for past days. Unprotected sex with new partner recently. States this feels like when she had chlamydia previously.  Patient is a 18 y.o. female presenting with female genitourinary complaint. The history is provided by the patient.  Female GU Problem This is a new problem. The current episode started 12 to 24 hours ago. The problem occurs constantly. The problem has not changed since onset.Pertinent negatives include no chest pain, no abdominal pain and no shortness of breath. Nothing aggravates the symptoms. Nothing relieves the symptoms.    Past Medical History  Diagnosis Date  . UTI (lower urinary tract infection)   . Asthma   . Obese   . Anxiety   . Headache(784.0)   . Panic    Past Surgical History  Procedure Laterality Date  . Cesarean section    . Hernia repair    . Fracture surgery      thumb   Family History  Problem Relation Age of Onset  . Diabetes Mother   . Migraines Mother   . Heart murmur Brother   . Cancer Paternal Grandfather    History  Substance Use Topics  . Smoking status: Former Smoker    Types: Cigars  . Smokeless tobacco: Never Used  . Alcohol Use: Yes     Comment: Occasional   OB History    Gravida Para Term Preterm AB TAB SAB Ectopic Multiple Living   2 1             Review of Systems  Constitutional: Negative for fever.  Respiratory: Negative for cough and shortness of breath.   Cardiovascular: Negative for chest pain.  Gastrointestinal: Positive for vomiting. Negative for abdominal pain.  All other systems reviewed and are negative.     Allergies  Review of patient's allergies indicates no known allergies.  Home Medications    Prior to Admission medications   Medication Sig Start Date End Date Taking? Authorizing Provider  albuterol (PROVENTIL HFA;VENTOLIN HFA) 108 (90 BASE) MCG/ACT inhaler Inhale 2 puffs into the lungs every 6 (six) hours as needed. Shortness of breath    Historical Provider, MD  albuterol (PROVENTIL HFA;VENTOLIN HFA) 108 (90 BASE) MCG/ACT inhaler Inhale 2 puffs into the lungs every 4 (four) hours as needed for wheezing or shortness of breath. Patient not taking: Reported on 06/23/2014 06/30/13   Gilda Creasehristopher J. Pollina, MD  aspirin-acetaminophen-caffeine [facility]EXCEDRIN MIGRAINE) [phone] MG per tablet Take 1 tablet by mouth every 6 (six) hours as needed for headache.    Historical Provider, MD  diphenoxylate-atropine (LOMOTIL) 2.5-0.025 MG per tablet Take 2 tablets by mouth 4 (four) times daily as needed for diarrhea or loose stools. Patient not taking: Reported on 06/23/2014 06/30/13   Gilda Creasehristopher J. Pollina, MD  etonogestrel (IMPLANON) 68 MG IMPL implant Inject 1 each into the skin once.    Historical Provider, MD  HYDROcodone-acetaminophen (NORCO/VICODIN) 5-325 MG per tablet Take 1 tablet by mouth every 4 (four) hours as needed for moderate pain or severe pain. 06/24/14   Einar GipWilliam Duncan Dansie, PA-C  ibuprofen (ADVIL,MOTRIN) 200 MG tablet Take 400 mg by mouth every 6 (six) hours as needed for pain.    Historical Provider, MD  methocarbamol (ROBAXIN) 500 MG tablet Take 1 tablet (  500 mg total) by mouth 2 (two) times daily as needed for muscle spasms. 06/24/14   Einar GipWilliam Duncan Dansie, PA-C  naproxen (NAPROSYN) 500 MG tablet Take 1 tablet (500 mg total) by mouth 2 (two) times daily with a meal. 06/24/14   Einar GipWilliam Duncan Dansie, PA-C  ondansetron (ZOFRAN ODT) 4 MG disintegrating tablet Take 1 tablet (4 mg total) by mouth every 8 (eight) hours as needed for nausea. Patient not taking: Reported on 06/23/2014 06/05/13   Garlon HatchetLisa M Sanders, PA-C  predniSONE (DELTASONE) 20 MG tablet 3 tabs po day one, then 2 tabs  daily x 4 days Patient not taking: Reported on 06/23/2014 06/30/13   Gilda Creasehristopher J. Pollina, MD  promethazine (PHENERGAN) 25 MG tablet Take 1 tablet (25 mg total) by mouth every 6 (six) hours as needed for nausea or vomiting. Patient not taking: Reported on 06/23/2014 06/30/13   Gilda Creasehristopher J. Pollina, MD  traMADol (ULTRAM) 50 MG tablet Take 1 tablet (50 mg total) by mouth every 6 (six) hours as needed. Patient not taking: Reported on 06/23/2014 06/05/13   Garlon HatchetLisa M Sanders, PA-C   BP 120/77 mmHg  Pulse 102  Temp(Src) 97.9 F (36.6 C) (Oral)  Resp 16  SpO2 98%  LMP 06/19/2014 Physical Exam  Constitutional: She is oriented to person, place, and time. She appears well-developed and well-nourished. No distress.  HENT:  Head: Normocephalic and atraumatic.  Mouth/Throat: Oropharynx is clear and moist.  Eyes: EOM are normal. Pupils are equal, round, and reactive to light.  Neck: Normal range of motion. Neck supple.  Cardiovascular: Normal rate and regular rhythm.  Exam reveals no friction rub.   No murmur heard. Pulmonary/Chest: Effort normal and breath sounds normal. No respiratory distress. She has no wheezes. She has no rales.  Abdominal: Soft. She exhibits no distension. There is no tenderness. There is no rebound.  Genitourinary: Cervix exhibits motion tenderness (mild) and discharge (scant, brown). Right adnexum displays tenderness (mild). Right adnexum displays no mass and no fullness. Left adnexum displays tenderness (mild). Left adnexum displays no mass and no fullness.  Musculoskeletal: Normal range of motion. She exhibits no edema.  Neurological: She is alert and oriented to person, place, and time.  Skin: She is not diaphoretic.  Nursing note and vitals reviewed.   ED Course  Procedures (including critical care time) Labs Review Labs Reviewed  WET PREP, GENITAL - Abnormal; Notable for the following:    Clue Cells Wet Prep HPF POC RARE (*)    WBC, Wet Prep HPF POC RARE (*)     All other components within normal limits  URINALYSIS, ROUTINE W REFLEX MICROSCOPIC - Abnormal; Notable for the following:    APPearance CLOUDY (*)    Hgb urine dipstick TRACE (*)    Leukocytes, UA SMALL (*)    All other components within normal limits  GC/CHLAMYDIA PROBE AMP  URINE MICROSCOPIC-ADD ON  POC URINE PREG, ED    Imaging Review No results found.   EKG Interpretation None      MDM   Final diagnoses:  UTI (lower urinary tract infection)  PID (acute pelvic inflammatory disease)    68F here with dysuria, pelvic pain. Concerned about STDs. Mild burning with urination. On exam, lower abdominal pain. AFVSS here. On pelvic, scant brown discharge, mild diffuse pelvic pain. Hx of STD, feels similar to that. Will treat for chlamydia/GC and for PID. Will burning with urination will also give keflex.  Stable for discharge.    Elwin MochaBlair Brandilyn Nanninga, MD 07/03/14 2123

## 2014-07-03 NOTE — ED Notes (Signed)
Pt reports unprotected sex 1 week sago. Pelvic pain x1 day. Dysuria present. Pain 4/10.

## 2014-07-03 NOTE — Discharge Instructions (Signed)
Pelvic Inflammatory Disease °Pelvic inflammatory disease (PID) refers to an infection in some or all of the female organs. The infection can be in the uterus, ovaries, fallopian tubes, or the surrounding tissues in the pelvis. PID can cause abdominal or pelvic pain that comes on suddenly (acute pelvic pain). PID is a serious infection because it can lead to lasting (chronic) pelvic pain or the inability to have children (infertile).  °CAUSES  °The infection is often caused by the normal bacteria found in the vaginal tissues. PID may also be caused by an infection that is spread during sexual contact. PID can also occur following:  °· The birth of a baby.   °· A miscarriage.   °· An abortion.   °· Major pelvic surgery.   °· The use of an intrauterine device (IUD).   °· A sexual assault.   °RISK FACTORS °Certain factors can put a person at higher risk for PID, such as: °· Being younger than 25 years. °· Being sexually active at a young age. °· Using nonbarrier contraception. °· Having multiple sexual partners. °· Having sex with someone who has symptoms of a genital infection. °· Using oral contraception. °Other times, certain behaviors can increase the possibility of getting PID, such as: °· Having sex during your period. °· Using a vaginal douche. °· Having an intrauterine device (IUD) in place. °SYMPTOMS  °· Abdominal or pelvic pain.   °· Fever.   °· Chills.   °· Abnormal vaginal discharge. °· Abnormal uterine bleeding.   °· Unusual pain shortly after finishing your period. °DIAGNOSIS  °Your caregiver will choose some of the following methods to make a diagnosis, such as:  °· Performing a physical exam and history. A pelvic exam typically reveals a very tender uterus and surrounding pelvis.   °· Ordering laboratory tests including a pregnancy test, blood tests, and urine test.  °· Ordering cultures of the vagina and cervix to check for a sexually transmitted infection (STI). °· Performing an ultrasound.    °· Performing a laparoscopic procedure to look inside the pelvis.   °TREATMENT  °· Antibiotic medicines may be prescribed and taken by mouth.   °· Sexual partners may be treated when the infection is caused by a sexually transmitted disease (STD).   °· Hospitalization may be needed to give antibiotics intravenously. °· Surgery may be needed, but this is rare. °It may take weeks until you are completely well. If you are diagnosed with PID, you should also be checked for human immunodeficiency virus (HIV).   °HOME CARE INSTRUCTIONS  °· If given, take your antibiotics as directed. Finish the medicine even if you start to feel better.   °· Only take over-the-counter or prescription medicines for pain, discomfort, or fever as directed by your caregiver.   °· Do not have sexual intercourse until treatment is completed or as directed by your caregiver. If PID is confirmed, your recent sexual partner(s) will need treatment.   °· Keep your follow-up appointments. °SEEK MEDICAL CARE IF:  °· You have increased or abnormal vaginal discharge.   °· You need prescription medicine for your pain.   °· You vomit.   °· You cannot take your medicines.   °· Your partner has an STD.   °SEEK IMMEDIATE MEDICAL CARE IF:  °· You have a fever.   °· You have increased abdominal or pelvic pain.   °· You have chills.   °· You have pain when you urinate.   °· You are not better after 72 hours following treatment.   °MAKE SURE YOU:  °· Understand these instructions. °· Will watch your condition. °· Will get help right away if you are not doing well or get worse. °  Document Released: 06/30/2005 Document Revised: 10/25/2012 Document Reviewed: 06/26/2011 °ExitCare® Patient Information ©2015 [employer]. This information is not intended to replace advice given to you by your health care provider. Make sure you discuss any questions you have with your health care provider. ° °Urinary Tract Infection °Urinary tract infections (UTIs) can develop  anywhere along your urinary tract. Your urinary tract is your body's drainage system for removing wastes and extra water. Your urinary tract includes two kidneys, two ureters, a bladder, and a urethra. Your kidneys are a pair of bean-shaped organs. Each kidney is about the size of your fist. They are located below your ribs, one on each side of your spine. °CAUSES °Infections are caused by microbes, which are microscopic organisms, including fungi, viruses, and bacteria. These organisms are so small that they can only be seen through a microscope. Bacteria are the microbes that most commonly cause UTIs. °SYMPTOMS  °Symptoms of UTIs may vary by age and gender of the patient and by the location of the infection. Symptoms in young women typically include a frequent and intense urge to urinate and a painful, burning feeling in the bladder or urethra during urination. Older women and men are more likely to be tired, shaky, and weak and have muscle aches and abdominal pain. A fever may mean the infection is in your kidneys. Other symptoms of a kidney infection include pain in your back or sides below the ribs, nausea, and vomiting. °DIAGNOSIS °To diagnose a UTI, your caregiver will ask you about your symptoms. Your caregiver also will ask to provide a urine sample. The urine sample will be tested for bacteria and white blood cells. White blood cells are made by your body to help fight infection. °TREATMENT  °Typically, UTIs can be treated with medication. Because most UTIs are caused by a bacterial infection, they usually can be treated with the use of antibiotics. The choice of antibiotic and length of treatment depend on your symptoms and the type of bacteria causing your infection. °HOME CARE INSTRUCTIONS °· If you were prescribed antibiotics, take them exactly as your caregiver instructs you. Finish the medication even if you feel better after you have only taken some of the medication. °· Drink enough water and  fluids to keep your urine clear or pale yellow. °· Avoid caffeine, tea, and carbonated beverages. They tend to irritate your bladder. °· Empty your bladder often. Avoid holding urine for long periods of time. °· Empty your bladder before and after sexual intercourse. °· After a bowel movement, women should cleanse from front to back. Use each tissue only once. °SEEK MEDICAL CARE IF:  °· You have back pain. °· You develop a fever. °· Your symptoms do not begin to resolve within 3 days. °SEEK IMMEDIATE MEDICAL CARE IF:  °· You have severe back pain or lower abdominal pain. °· You develop chills. °· You have nausea or vomiting. °· You have continued burning or discomfort with urination. °MAKE SURE YOU:  °· Understand these instructions. °· Will watch your condition. °· Will get help right away if you are not doing well or get worse. °Document Released: 04/09/2005 Document Revised: 12/30/2011 Document Reviewed: 08/08/2011 °ExitCare® Patient Information ©2015 [employer]. This information is not intended to replace advice given to you by your health care provider. Make sure you discuss any questions you have with your health care provider. ° °

## 2014-07-13 ENCOUNTER — Telehealth (HOSPITAL_BASED_OUTPATIENT_CLINIC_OR_DEPARTMENT_OTHER): Payer: Self-pay | Admitting: *Deleted

## 2014-07-28 LAB — GC/CHLAMYDIA PROBE AMP
CT PROBE, AMP APTIMA: POSITIVE — AB
GC PROBE AMP APTIMA: NEGATIVE

## 2014-09-26 ENCOUNTER — Emergency Department (HOSPITAL_COMMUNITY)
Admission: EM | Admit: 2014-09-26 | Discharge: 2014-09-26 | Disposition: A | Discharge status: Home / Self Care | Payer: Medicaid Other | Attending: Emergency Medicine | Admitting: Emergency Medicine

## 2014-09-26 ENCOUNTER — Encounter (HOSPITAL_COMMUNITY): Payer: Self-pay | Admitting: Emergency Medicine

## 2014-09-26 DIAGNOSIS — J45909 Unspecified asthma, uncomplicated: Secondary | ICD-10-CM | POA: Diagnosis not present

## 2014-09-26 DIAGNOSIS — K088 Other specified disorders of teeth and supporting structures: Secondary | ICD-10-CM | POA: Diagnosis present

## 2014-09-26 DIAGNOSIS — R110 Nausea: Secondary | ICD-10-CM | POA: Insufficient documentation

## 2014-09-26 DIAGNOSIS — Z79899 Other long term (current) drug therapy: Secondary | ICD-10-CM | POA: Diagnosis not present

## 2014-09-26 DIAGNOSIS — Z87440 Personal history of urinary (tract) infections: Secondary | ICD-10-CM | POA: Diagnosis not present

## 2014-09-26 DIAGNOSIS — Z3202 Encounter for pregnancy test, result negative: Secondary | ICD-10-CM | POA: Diagnosis not present

## 2014-09-26 DIAGNOSIS — Z792 Long term (current) use of antibiotics: Secondary | ICD-10-CM | POA: Diagnosis not present

## 2014-09-26 DIAGNOSIS — F410 Panic disorder [episodic paroxysmal anxiety] without agoraphobia: Secondary | ICD-10-CM | POA: Diagnosis not present

## 2014-09-26 DIAGNOSIS — Z87891 Personal history of nicotine dependence: Secondary | ICD-10-CM | POA: Insufficient documentation

## 2014-09-26 DIAGNOSIS — E669 Obesity, unspecified: Secondary | ICD-10-CM | POA: Insufficient documentation

## 2014-09-26 DIAGNOSIS — K0889 Other specified disorders of teeth and supporting structures: Secondary | ICD-10-CM

## 2014-09-26 DIAGNOSIS — Z791 Long term (current) use of non-steroidal anti-inflammatories (NSAID): Secondary | ICD-10-CM | POA: Insufficient documentation

## 2014-09-26 LAB — POC URINE PREG, ED: PREG TEST UR: NEGATIVE

## 2014-09-26 MED ORDER — NAPROXEN 500 MG PO TABS: 500.0000 mg | ORAL_TABLET | Freq: Two times a day (BID) | ORAL | Status: DC

## 2014-09-26 MED ORDER — PENICILLIN V POTASSIUM 500 MG PO TABS: 500.0000 mg | ORAL_TABLET | Freq: Four times a day (QID) | ORAL | Status: AC

## 2014-09-26 NOTE — ED Notes (Signed)
Pt stated that her l/lower mouth pain x 2 weeks. Pt also stated that her period 4 days late and is concerned about possible pregnancy

## 2014-09-26 NOTE — ED Provider Notes (Signed)
CSN: [id number]     Arrival date & time 09/26/14  1738 History  This chart was scribed for Joycie Peek, working with Glynn Octave, MD by Elon Spanner, ED Scribe. This patient was seen in room WTR9/WTR9 and the patient's care was started at 7:00 PM.   Chief Complaint  Patient presents with  . Dental Pain    lower l/mouth pain x 2 weeks  . Possible Pregnancy    pt stated that her period is 4 days late   HPI HPI Comments: Jodi Daniel is a 19 y.o. female who presents to the Emergency Department complaining of 2/10 left lower dental pain around lower left molar onset two weeks ago.  She reports the pain is relieved transiently by applying Goody powder and orajel .  The pain is worsened by air movement, cold water.   Patient reports a history of asthma, but denies other conditions including allergies.  Patient denies vomiting, fever.  Patient also requests a blood pregnancy test because her previous pregnancy was not revealed by a urine pregnancy test because she is 4 days late for her period.  She reports recent nausea, foot swelling, decreased appetite, and cravings for certain foods, especially peanuts. Denies vomiting, fever, abdominal pain, pelvic pain, vaginal bleeding or discharge.   Past Medical History  Diagnosis Date  . UTI (lower urinary tract infection)   . Asthma   . Obese   . Anxiety   . Headache(784.0)   . Panic    Past Surgical History  Procedure Laterality Date  . Cesarean section    . Hernia repair    . Fracture surgery      thumb   Family History  Problem Relation Age of Onset  . Diabetes Mother   . Migraines Mother   . Heart murmur Brother   . Cancer Paternal Grandfather    History  Substance Use Topics  . Smoking status: Former Smoker    Types: Cigars  . Smokeless tobacco: Never Used  . Alcohol Use: Yes     Comment: Occasional   OB History    Gravida Para Term Preterm AB TAB SAB Ectopic Multiple Living   2 1             Review of Systems   Constitutional: Negative for fever.  HENT: Positive for dental problem.   Gastrointestinal: Positive for nausea. Negative for vomiting.  All other systems reviewed and are negative.     Allergies  Review of patient's allergies indicates no known allergies.  Home Medications   Prior to Admission medications   Medication Sig Start Date End Date Taking? Authorizing Provider  albuterol (PROVENTIL HFA;VENTOLIN HFA) 108 (90 BASE) MCG/ACT inhaler Inhale 2 puffs into the lungs every 6 (six) hours as needed. Shortness of breath    Historical Provider, MD  albuterol (PROVENTIL HFA;VENTOLIN HFA) 108 (90 BASE) MCG/ACT inhaler Inhale 2 puffs into the lungs every 4 (four) hours as needed for wheezing or shortness of breath. Patient not taking: Reported on 06/23/2014 06/30/13   Gilda Crease, MD  aspirin-acetaminophen-caffeine (EXCEDRIN MIGRAINE) [phone] MG per tablet Take 1 tablet by mouth every 6 (six) hours as needed for headache.    Historical Provider, MD  cephALEXin (KEFLEX) 500 MG capsule Take 1 capsule (500 mg total) by mouth 3 (three) times daily. 07/03/14   Elwin Mocha, MD  diphenoxylate-atropine (LOMOTIL) 2.5-0.025 MG per tablet Take 2 tablets by mouth 4 (four) times daily as needed for diarrhea or loose stools. Patient not taking:  Reported on 06/23/2014 06/30/13   Gilda Creasehristopher J Pollina, MD  doxycycline (VIBRA-TABS) 100 MG tablet Take 1 tablet (100 mg total) by mouth 2 (two) times daily. 07/03/14   Elwin MochaBlair Walden, MD  etonogestrel (IMPLANON) 68 MG IMPL implant Inject 1 each into the skin once.    Historical Provider, MD  HYDROcodone-acetaminophen (NORCO/VICODIN) 5-325 MG per tablet Take 1 tablet by mouth every 4 (four) hours as needed for moderate pain or severe pain. 06/24/14   Everlene FarrierWilliam Dansie, PA-C  ibuprofen (ADVIL,MOTRIN) 200 MG tablet Take 400 mg by mouth every 6 (six) hours as needed for pain.    Historical Provider, MD  methocarbamol (ROBAXIN) 500 MG tablet Take 1 tablet (500  mg total) by mouth 2 (two) times daily as needed for muscle spasms. 06/24/14   Everlene FarrierWilliam Dansie, PA-C  naproxen (NAPROSYN) 500 MG tablet Take 1 tablet (500 mg total) by mouth 2 (two) times daily. 09/26/14   Joycie PeekBenjamin Dayron Odland, PA-C  ondansetron (ZOFRAN ODT) 4 MG disintegrating tablet Take 1 tablet (4 mg total) by mouth every 8 (eight) hours as needed for nausea. Patient not taking: Reported on 06/23/2014 06/05/13   Garlon HatchetLisa M Sanders, PA-C  penicillin v potassium (VEETID) 500 MG tablet Take 1 tablet (500 mg total) by mouth 4 (four) times daily. 09/26/14 10/03/14  Joycie PeekBenjamin Elandra Powell, PA-C  predniSONE (DELTASONE) 20 MG tablet 3 tabs po day one, then 2 tabs daily x 4 days Patient not taking: Reported on 06/23/2014 06/30/13   Gilda Creasehristopher J Pollina, MD  promethazine (PHENERGAN) 25 MG tablet Take 1 tablet (25 mg total) by mouth every 6 (six) hours as needed for nausea or vomiting. Patient not taking: Reported on 06/23/2014 06/30/13   Gilda Creasehristopher J Pollina, MD  traMADol (ULTRAM) 50 MG tablet Take 1 tablet (50 mg total) by mouth every 6 (six) hours as needed. Patient not taking: Reported on 06/23/2014 06/05/13   Garlon HatchetLisa M Sanders, PA-C   BP 133/76 mmHg  Pulse 73  Temp(Src) 98.5 F (36.9 C) (Oral)  Resp 18  SpO2 100%  LMP 08/23/2014 (Exact Date) Physical Exam  Constitutional: She is oriented to person, place, and time. She appears well-developed and well-nourished. No distress.  HENT:  Head: Normocephalic and atraumatic.  Tenderness located to lower left posterior molar. No caries noted. Mucous membranes are moist. No unilateral tonsillar swelling, uvula midline, no glossal swelling or elevation. No trismus. No fluctuance or evidence of a drainable abscess. No other evidence of emergent infection, Ludwig or Vincents angina. Tolerating secretions well. Patent airway   Eyes: Conjunctivae and EOM are normal.  Neck: Neck supple. No tracheal deviation present.  Cardiovascular: Normal rate.   Pulmonary/Chest: Effort  normal. No respiratory distress.  Musculoskeletal: Normal range of motion.  Neurological: She is alert and oriented to person, place, and time.  Skin: Skin is warm and dry.  Psychiatric: She has a normal mood and affect. Her behavior is normal.  Nursing note and vitals reviewed.   ED Course  Procedures (including critical care time)  DIAGNOSTIC STUDIES: Oxygen Saturation is 100% on RA, normal by my interpretation.    COORDINATION OF CARE:  7:07 PM Discussed treatment plan with patient at bedside.  Patient acknowledges and agrees with plan.    Labs Review Labs Reviewed  POC URINE PREG, ED    Imaging Review No results found.   EKG Interpretation None     Meds given in ED:  Medications - No data to display  Discharge Medication List as of 09/26/2014  8:02 PM  START taking these medications   Details  penicillin v potassium (VEETID) 500 MG tablet Take 1 tablet (500 mg total) by mouth 4 (four) times daily., Starting 09/26/2014, Until Tue 10/03/14, Print       Filed Vitals:   09/26/14 1831  BP: 133/76  Pulse: 73  Temp: 98.5 F (36.9 C)  TempSrc: Oral  Resp: 18  SpO2: 100%    MDM  Vitals stable - WNL -afebrile Pt resting comfortably in ED. PE--no evidence of active dental infection. No evidence of drainable abscess, retropharyngeal or peritonsillar abscess. Patent airway Urine pregnancy negative. Discussed follow-up with health Department.  DDX-2 week history of dental pain, will treat with antibiotics, anti-inflammatories. Referral given to dentistry as well as resource guide the patient may establish care with PCP.  I discussed all relevant lab findings and imaging results with pt and they verbalized understanding. Discussed f/u with PCP within 48 hrs and return precautions, pt very amenable to plan.  Final diagnoses:  Pain, dental    I personally performed the services described in this documentation, which was scribed in my presence. The recorded  information has been reviewed and is accurate.    Joycie Peek, PA-C 09/26/14 2127  Glynn Octave, MD 09/26/14 [phone]

## 2014-09-26 NOTE — Discharge Instructions (Signed)
Dental Pain °A tooth ache may be caused by cavities (tooth decay). Cavities expose the nerve of the tooth to air and hot or cold temperatures. It may come from an infection or abscess (also called a boil or furuncle) around your tooth. It is also often caused by dental caries (tooth decay). This causes the pain you are having. °DIAGNOSIS  °Your caregiver can diagnose this problem by exam. °TREATMENT  °· If caused by an infection, it may be treated with medications which kill germs (antibiotics) and pain medications as prescribed by your caregiver. Take medications as directed. °· Only take over-the-counter or prescription medicines for pain, discomfort, or fever as directed by your caregiver. °· Whether the tooth ache today is caused by infection or dental disease, you should see your dentist as soon as possible for further care. °SEEK MEDICAL CARE IF: °The exam and treatment you received today has been provided on an emergency basis only. This is not a substitute for complete medical or dental care. If your problem worsens or new problems (symptoms) appear, and you are unable to meet with your dentist, call or return to this location. °SEEK IMMEDIATE MEDICAL CARE IF:  °· You have a fever. °· You develop redness and swelling of your face, jaw, or neck. °· You are unable to open your mouth. °· You have severe pain uncontrolled by pain medicine. °MAKE SURE YOU:  °· Understand these instructions. °· Will watch your condition. °· Will get help right away if you are not doing well or get worse. °Document Released: 06/30/2005 Document Revised: 09/22/2011 Document Reviewed: 02/16/2008 °[employer]® Patient Information ©2015 [employer]. This information is not intended to replace advice given to you by your health care provider. Make sure you discuss any questions you have with your health care provider. ° °Dental Care and Dentist Visits °Dental care supports good overall health. Regular dental visits can also help you  avoid dental pain, bleeding, infection, and other more serious health problems in the future. It is important to keep the mouth healthy because diseases in the teeth, gums, and other oral tissues can spread to other areas of the body. Some problems, such as diabetes, heart disease, and pre-term labor have been associated with poor oral health.  °See your dentist every 6 months. If you experience emergency problems such as a toothache or broken tooth, go to the dentist right away. If you see your dentist regularly, you may catch problems early. It is easier to be treated for problems in the early stages.  °WHAT TO EXPECT AT A DENTIST VISIT  °Your dentist will look for many common oral health problems and recommend proper treatment. At your regular dental visit, you can expect: °· Gentle cleaning of the teeth and gums. This includes scraping and polishing. This helps to remove the sticky substance around the teeth and gums (plaque). Plaque forms in the mouth shortly after eating. Over time, plaque hardens on the teeth as tartar. If tartar is not removed regularly, it can cause problems. Cleaning also helps remove stains. °· Periodic X-rays. These pictures of the teeth and supporting bone will help your dentist assess the health of your teeth. °· Periodic fluoride treatments. Fluoride is a natural mineral shown to help strengthen teeth. Fluoride treatment involves applying a fluoride gel or varnish to the teeth. It is most commonly done in children. °· Examination of the mouth, tongue, jaws, teeth, and gums to look for any oral health problems, such as: °¨ Cavities (dental caries). This is   decay on the tooth caused by plaque, sugar, and acid in the mouth. It is best to catch a cavity when it is small. °¨ Inflammation of the gums caused by plaque buildup (gingivitis). °¨ Problems with the mouth or malformed or misaligned teeth. °¨ Oral cancer or other diseases of the soft tissues or jaws.  °KEEP YOUR TEETH AND GUMS  HEALTHY °For healthy teeth and gums, follow these general guidelines as well as your dentist's specific advice: °· Have your teeth professionally cleaned at the dentist every 6 months. °· Brush twice daily with a fluoride toothpaste. °· Floss your teeth daily.  °· Ask your dentist if you need fluoride supplements, treatments, or fluoride toothpaste. °· Eat a healthy diet. Reduce foods and drinks with added sugar. °· Avoid smoking. °TREATMENT FOR ORAL HEALTH PROBLEMS °If you have oral health problems, treatment varies depending on the conditions present in your teeth and gums. °· Your caregiver will most likely recommend good oral hygiene at each visit. °· For cavities, gingivitis, or other oral health disease, your caregiver will perform a procedure to treat the problem. This is typically done at a separate appointment. Sometimes your caregiver will refer you to another dental specialist for specific tooth problems or for surgery. °SEEK IMMEDIATE DENTAL CARE IF: °· You have pain, bleeding, or soreness in the gum, tooth, jaw, or mouth area. °· A permanent tooth becomes loose or separated from the gum socket. °· You experience a blow or injury to the mouth or jaw area. °Document Released: 03/12/2011 Document Revised: 09/22/2011 Document Reviewed: 03/12/2011 °[employer]® Patient Information ©2015 [employer]. This information is not intended to replace advice given to you by your health care provider. Make sure you discuss any questions you have with your health care provider. ° °

## 2014-10-11 ENCOUNTER — Encounter (HOSPITAL_COMMUNITY): Payer: Self-pay | Admitting: *Deleted

## 2014-10-11 ENCOUNTER — Inpatient Hospital Stay (HOSPITAL_COMMUNITY)
Admission: AD | Admit: 2014-10-11 | Discharge: 2014-10-11 | Disposition: A | Discharge status: Home / Self Care | Payer: Medicaid Other | Source: Ambulatory Visit | Attending: Obstetrics and Gynecology | Admitting: Obstetrics and Gynecology

## 2014-10-11 DIAGNOSIS — R1032 Left lower quadrant pain: Secondary | ICD-10-CM | POA: Diagnosis not present

## 2014-10-11 DIAGNOSIS — N939 Abnormal uterine and vaginal bleeding, unspecified: Secondary | ICD-10-CM | POA: Insufficient documentation

## 2014-10-11 DIAGNOSIS — Z87891 Personal history of nicotine dependence: Secondary | ICD-10-CM | POA: Diagnosis not present

## 2014-10-11 DIAGNOSIS — R1012 Left upper quadrant pain: Secondary | ICD-10-CM | POA: Diagnosis not present

## 2014-10-11 HISTORY — DX: Polycystic ovarian syndrome: E28.2

## 2014-10-11 LAB — URINALYSIS, ROUTINE W REFLEX MICROSCOPIC
Bilirubin Urine: NEGATIVE
Glucose, UA: NEGATIVE mg/dL
Ketones, ur: NEGATIVE mg/dL
Leukocytes, UA: NEGATIVE
NITRITE: NEGATIVE
PH: 5.5 (ref 5.0–8.0)
PROTEIN: NEGATIVE mg/dL
Urobilinogen, UA: 0.2 mg/dL (ref 0.0–1.0)

## 2014-10-11 LAB — URINE MICROSCOPIC-ADD ON

## 2014-10-11 LAB — WET PREP, GENITAL
CLUE CELLS WET PREP: NONE SEEN
Trich, Wet Prep: NONE SEEN
YEAST WET PREP: NONE SEEN

## 2014-10-11 LAB — POCT PREGNANCY, URINE: Preg Test, Ur: NEGATIVE

## 2014-10-11 LAB — HCG, QUANTITATIVE, PREGNANCY: hCG, Beta Chain, Quant, S: <1 m[IU]/mL (ref <5)

## 2014-10-11 NOTE — MAU Note (Signed)
States that she had negative UPT tests when she was pregnant with her daughter; desires to have blood test for pregnancy;
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
## 2014-10-11 NOTE — Discharge Instructions (Signed)
Abnormal Uterine Bleeding °Abnormal uterine bleeding can affect women at various stages in life, including teenagers, women in their reproductive years, pregnant women, and women who have reached menopause. Several kinds of uterine bleeding are considered abnormal, including: °· Bleeding or spotting between periods.   °· Bleeding after sexual intercourse.   °· Bleeding that is heavier or more than normal.   °· Periods that last longer than usual. °· Bleeding after menopause.   °Many cases of abnormal uterine bleeding are minor and simple to treat, while others are more serious. Any type of abnormal bleeding should be evaluated by your health care provider. Treatment will depend on the cause of the bleeding. °HOME CARE INSTRUCTIONS °Monitor your condition for any changes. The following actions may help to alleviate any discomfort you are experiencing: °· Avoid the use of tampons and douches as directed by your health care provider. °· Change your pads frequently. °You should get regular pelvic exams and Pap tests. Keep all follow-up appointments for diagnostic tests as directed by your health care provider.  °SEEK MEDICAL CARE IF:  °· Your bleeding lasts more than 1 week.   °· You feel dizzy at times.   °SEEK IMMEDIATE MEDICAL CARE IF:  °· You pass out.   °· You are changing pads every 15 to 30 minutes.   °· You have abdominal pain. °· You have a fever.   °· You become sweaty or weak.   °· You are passing large blood clots from the vagina.   °· You start to feel nauseous and vomit. °MAKE SURE YOU:  °· Understand these instructions. °· Will watch your condition. °· Will get help right away if you are not doing well or get worse. °Document Released: 06/30/2005 Document Revised: 07/05/2013 Document Reviewed: 01/27/2013 °Exit[employer]® Patient Information ©2015 [employer]. This information is not intended to replace advice given to you by your health care provider. Make sure you discuss any questions you have with your  health care provider. ° °Abnormal Uterine Bleeding °Abnormal uterine bleeding can affect women at various stages in life, including teenagers, women in their reproductive years, pregnant women, and women who have reached menopause. Several kinds of uterine bleeding are considered abnormal, including: °· Bleeding or spotting between periods.   °· Bleeding after sexual intercourse.   °· Bleeding that is heavier or more than normal.   °· Periods that last longer than usual. °· Bleeding after menopause.   °Many cases of abnormal uterine bleeding are minor and simple to treat, while others are more serious. Any type of abnormal bleeding should be evaluated by your health care provider. Treatment will depend on the cause of the bleeding. °HOME CARE INSTRUCTIONS °Monitor your condition for any changes. The following actions may help to alleviate any discomfort you are experiencing: °· Avoid the use of tampons and douches as directed by your health care provider. °· Change your pads frequently. °You should get regular pelvic exams and Pap tests. Keep all follow-up appointments for diagnostic tests as directed by your health care provider.  °SEEK MEDICAL CARE IF:  °· Your bleeding lasts more than 1 week.   °· You feel dizzy at times.   °SEEK IMMEDIATE MEDICAL CARE IF:  °· You pass out.   °· You are changing pads every 15 to 30 minutes.   °· You have abdominal pain. °· You have a fever.   °· You become sweaty or weak.   °· You are passing large blood clots from the vagina.   °· You start to feel nauseous and vomit. °MAKE SURE YOU:  °· Understand these instructions. °· Will watch your   condition. °· Will get help right away if you are not doing well or get worse. °Document Released: 06/30/2005 Document Revised: 07/05/2013 Document Reviewed: 01/27/2013 °Exit[employer]® Patient Information ©2015 [employer]. This information is not intended to replace advice given to you by your health care provider. Make sure you discuss any  questions you have with your health care provider. ° °
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
## 2014-10-12 LAB — GC/CHLAMYDIA PROBE AMP (~~LOC~~) NOT AT ARMC
Chlamydia: POSITIVE — AB
Neisseria Gonorrhea: NEGATIVE

## 2014-10-12 LAB — HIV ANTIBODY (ROUTINE TESTING W REFLEX): HIV SCREEN 4TH GENERATION: NONREACTIVE

## 2014-10-13 ENCOUNTER — Telehealth: Payer: Self-pay | Admitting: Obstetrics & Gynecology

## 2014-10-13 DIAGNOSIS — Z202 Contact with and (suspected) exposure to infections with a predominantly sexual mode of transmission: Secondary | ICD-10-CM

## 2014-10-13 MED ORDER — AZITHROMYCIN 500 MG PO TABS: ORAL_TABLET | ORAL | Status: DC
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
## 2015-07-08 ENCOUNTER — Encounter (HOSPITAL_COMMUNITY): Payer: Self-pay | Admitting: *Deleted

## 2015-07-08 ENCOUNTER — Emergency Department (HOSPITAL_COMMUNITY)
Admission: AD | Admit: 2015-07-08 | Discharge: 2015-07-08 | Disposition: A | Discharge status: Home / Self Care | Payer: Medicaid Other | Source: Ambulatory Visit | Attending: Family Medicine | Admitting: Family Medicine

## 2015-07-08 ENCOUNTER — Inpatient Hospital Stay (EMERGENCY_DEPARTMENT_HOSPITAL)
Admission: AD | Admit: 2015-07-08 | Discharge: 2015-07-09 | Disposition: A | Discharge status: Home / Self Care | Payer: Medicaid Other | Source: Ambulatory Visit | Attending: Family Medicine | Admitting: Family Medicine

## 2015-07-08 ENCOUNTER — Encounter (HOSPITAL_COMMUNITY): Payer: Self-pay | Admitting: Oncology

## 2015-07-08 DIAGNOSIS — E669 Obesity, unspecified: Secondary | ICD-10-CM | POA: Insufficient documentation

## 2015-07-08 DIAGNOSIS — R102 Pelvic and perineal pain: Secondary | ICD-10-CM | POA: Insufficient documentation

## 2015-07-08 DIAGNOSIS — J45909 Unspecified asthma, uncomplicated: Secondary | ICD-10-CM | POA: Diagnosis not present

## 2015-07-08 DIAGNOSIS — N939 Abnormal uterine and vaginal bleeding, unspecified: Secondary | ICD-10-CM | POA: Diagnosis not present

## 2015-07-08 DIAGNOSIS — N923 Ovulation bleeding: Secondary | ICD-10-CM

## 2015-07-08 LAB — URINALYSIS, ROUTINE W REFLEX MICROSCOPIC
BILIRUBIN URINE: NEGATIVE
Glucose, UA: NEGATIVE mg/dL
Ketones, ur: NEGATIVE mg/dL
Leukocytes, UA: NEGATIVE
NITRITE: NEGATIVE
PH: 6 (ref 5.0–8.0)
Protein, ur: NEGATIVE mg/dL
SPECIFIC GRAVITY, URINE: 1.02 (ref 1.005–1.030)

## 2015-07-08 LAB — URINE MICROSCOPIC-ADD ON

## 2015-07-08 NOTE — ED Notes (Signed)
Pt presents d/t pelvic pain and spotting between periods.

## 2015-07-08 NOTE — MAU Note (Signed)
Patient presents to mau with c/o vaginal bleeding that is spotted in nature; "light red"; notices more when wipes. 2 negatives and +HPT at home on the 5th. . Endorses abdominal cramping--9/10 on pain scale. Has taken advil and tylenol for the pain. Patient states period usually starts on the 4th of the month but was a week late this month.

## 2015-07-09 ENCOUNTER — Telehealth: Payer: Self-pay | Admitting: Advanced Practice Midwife

## 2015-07-09 DIAGNOSIS — B9689 Other specified bacterial agents as the cause of diseases classified elsewhere: Secondary | ICD-10-CM

## 2015-07-09 DIAGNOSIS — N939 Abnormal uterine and vaginal bleeding, unspecified: Secondary | ICD-10-CM

## 2015-07-09 DIAGNOSIS — N760 Acute vaginitis: Principal | ICD-10-CM

## 2015-07-09 LAB — WET PREP, GENITAL
Sperm: NONE SEEN
Trich, Wet Prep: NONE SEEN
YEAST WET PREP: NONE SEEN

## 2015-07-09 LAB — HCG, QUANTITATIVE, PREGNANCY

## 2015-07-09 MED ORDER — METRONIDAZOLE 500 MG PO TABS: 500.0000 mg | ORAL_TABLET | Freq: Two times a day (BID) | ORAL | Status: DC

## 2015-07-09 NOTE — Telephone Encounter (Signed)
DX BV.  Rx Flagyl. 

## 2015-07-09 NOTE — MAU Provider Note (Signed)
Chief Complaint: Vaginal Bleeding and Abdominal Cramping  First Provider Initiated Contact with Patient 07/09/15 0055     SUBJECTIVE HPI: Jodi Daniel is a 19 y.o. [phone] female who presents to [facility] reporting small amount of vaginal bleeding 2 days and low back pain wrapping around to left lower quadrant 3 days. States she had 2 negative and one positive home pregnancy test. Has not had any other evaluation for this problem. Normally has monthly cycle starting around the fifth.Marland Kitchen. LMP 06/24/2015.   Location: Low back radiating around to left lower quadrant Quality: Cramping, aching Severity: 8/10 on pain scale  Duration: 3 days Context: [place] Timing: Constant Modifying factors: Resolved with ibuprofen. Associated signs and symptoms: Positive for vaginal bleeding, constipation, bloating, frequency of urination times one hour. Negative for fever, chills, vaginal discharge, dyspareunia, dysuria, hematuria, flank pain, urgency, nausea, vomiting, diarrhea.  Past Medical History  Diagnosis Date  . UTI (lower urinary tract infection)   . Asthma   . Obese   . Anxiety   . Headache(784.0)   . Panic   . PCOS (polycystic ovarian syndrome)    OB History  Gravida Para Term Preterm AB SAB TAB Ectopic Multiple Living  3 1  1 1 1    1     # Outcome Date GA Lbr Len/2nd Weight Sex Delivery Anes PTL Lv  3 SAB           2 Preterm           1 Gravida              Comments: System Generated. Please review and update pregnancy details.     Past Surgical History  Procedure Laterality Date  . Cesarean section    . Hernia repair    . Fracture surgery      thumb   Social History   Social History  . Marital Status: Single    Spouse Name: N/A  . Number of Children: N/A  . Years of Education: N/A   Occupational History  . Not on file.   Social History Main Topics  . Smoking status: Former Smoker    Types: Cigars  .  Smokeless tobacco: Never Used  . Alcohol Use: Yes     Comment: Occasional  . Drug Use: No  . Sexual Activity: Yes    Birth Control/ Protection: None   Other Topics Concern  . Not on file   Social History Narrative   No current facility-administered medications on file prior to encounter.   Current Outpatient Prescriptions on File Prior to Encounter  Medication Sig Dispense Refill  . albuterol (PROVENTIL HFA;VENTOLIN HFA) 108 (90 BASE) MCG/ACT inhaler Inhale 2 puffs into the lungs every 4 (four) hours as needed for wheezing or shortness of breath. 1 Inhaler 2  . aspirin-acetaminophen-caffeine (EXCEDRIN MIGRAINE) 250-250-65 MG per tablet Take 1 tablet by mouth every 6 (six) hours as needed for headache.     No Known Allergies  I have reviewed the past Medical Hx, Surgical Hx, Social Hx, Allergies and Medications.   Review of Systems  OBJECTIVE Patient Vitals for the past 24 hrs:  BP Temp Temp src Pulse Resp SpO2 Height Weight  07/09/15 0116 128/78 mmHg 97.8 F (36.6 C) Oral 68 18 - - -  07/08/15 2303 119/78 mmHg 98.5 F (36.9 C) Oral 72 19 100 % 5\' 8"  (1.727 m) 191 lb 12.8 oz (87 kg)   Constitutional: Well-developed, well-nourished female in  no acute distress.  Cardiovascular: normal rate Respiratory: normal rate and effort.  GI: Abd mildly distended, non-tender.  MS: Low back mildly tender normal ROM Neurologic: Alert and oriented x 4.  GU: Neg CVAT.  SPECULUM EXAM: NEFG, small amount of dark red, malodorous blood noted, cervix clean  BIMANUAL: cervix closed; uterus normal size, no adnexal tenderness or masses. No CMT.  LAB RESULTS Results for orders placed or performed during the hospital encounter of 07/08/15 (from the past 24 hour(s))  Urinalysis, Routine w reflex microscopic (not at [facility])     Status: Abnormal   Collection Time: 07/08/15 11:04 PM  Result Value Ref Range   Color, Urine YELLOW YELLOW   APPearance CLEAR CLEAR   Specific Gravity, Urine 1.020 1.005 -  1.030   pH 6.0 5.0 - 8.0   Glucose, UA NEGATIVE NEGATIVE mg/dL   Hgb urine dipstick LARGE (A) NEGATIVE   Bilirubin Urine NEGATIVE NEGATIVE   Ketones, ur NEGATIVE NEGATIVE mg/dL   Protein, ur NEGATIVE NEGATIVE mg/dL   Nitrite NEGATIVE NEGATIVE   Leukocytes, UA NEGATIVE NEGATIVE  Urine microscopic-add on     Status: Abnormal   Collection Time: 07/08/15 11:04 PM  Result Value Ref Range   Squamous Epithelial / LPF 0-5 (A) NONE SEEN   WBC, UA 0-5 0 - 5 WBC/hpf   RBC / HPF 0-5 0 - 5 RBC/hpf   Bacteria, UA FEW (A) NONE SEEN  hCG, quantitative, pregnancy     Status: None   Collection Time: 07/08/15 11:25 PM  Result Value Ref Range   hCG, Beta Chain, Quant, S <1 <5 mIU/mL  Wet prep, genital     Status: Abnormal   Collection Time: 07/09/15  1:05 AM  Result Value Ref Range   Yeast Wet Prep HPF POC NONE SEEN NONE SEEN   Trich, Wet Prep NONE SEEN NONE SEEN   Clue Cells Wet Prep HPF POC PRESENT (A) NONE SEEN   WBC, Wet Prep HPF POC MODERATE (A) NONE SEEN   Sperm NONE SEEN     IMAGING No results found.  MAU COURSE UPT negative. Quant drawn. UA, wet prep, GC/chlamydia cultures, speculum exam.  Quant negative.  MDM - 19 year old nonpregnant female with intermenstrual bleeding likely hormonal in etiology.  - BV - Musculoskeletal low back pain - Constipation/bloating  No evidence of emergent condition.  ASSESSMENT 1. Intermenstrual bleeding     PLAN Discharge home in stable condition. GC/Chlamydia cultures pending. Ibuprofen as needed for pain.     Follow-up Information    Follow up with OB/GYN.   Why:  As needed if symptoms worsen       Medication List    STOP taking these medications        azithromycin 500 MG tablet  Commonly known as:  ZITHROMAX      TAKE these medications        albuterol 108 (90 BASE) MCG/ACT inhaler  Commonly known as:  PROVENTIL HFA;VENTOLIN HFA  Inhale 2 puffs into the lungs every 4 (four) hours as needed for wheezing or shortness of  breath.     aspirin-acetaminophen-caffeine 250-250-65 MG tablet  Commonly known as:  EXCEDRIN MIGRAINE  Take 1 tablet by mouth every 6 (six) hours as needed for headache.     ibuprofen 600 MG tablet  Commonly known as:  ADVIL,MOTRIN  Take 1 tablet (600 mg total) by mouth every 6 (six) hours as needed.        [place], CNM 07/09/2015  1:37 AM

## 2015-07-09 NOTE — Discharge Instructions (Signed)

## 2015-07-13 LAB — GC/CHLAMYDIA PROBE AMP (~~LOC~~) NOT AT ARMC
Chlamydia: NEGATIVE
Neisseria Gonorrhea: NEGATIVE

## 2015-11-09 ENCOUNTER — Inpatient Hospital Stay (HOSPITAL_COMMUNITY)
Admission: AD | Admit: 2015-11-09 | Discharge: 2015-11-09 | Disposition: A | Discharge status: Home / Self Care | Payer: Medicaid Other | Source: Ambulatory Visit | Attending: Obstetrics and Gynecology | Admitting: Obstetrics and Gynecology

## 2015-11-09 ENCOUNTER — Encounter (HOSPITAL_COMMUNITY): Payer: Self-pay | Admitting: *Deleted

## 2015-11-09 DIAGNOSIS — A499 Bacterial infection, unspecified: Secondary | ICD-10-CM

## 2015-11-09 DIAGNOSIS — F419 Anxiety disorder, unspecified: Secondary | ICD-10-CM | POA: Diagnosis not present

## 2015-11-09 DIAGNOSIS — Z87891 Personal history of nicotine dependence: Secondary | ICD-10-CM | POA: Insufficient documentation

## 2015-11-09 DIAGNOSIS — N760 Acute vaginitis: Secondary | ICD-10-CM | POA: Diagnosis not present

## 2015-11-09 DIAGNOSIS — J45909 Unspecified asthma, uncomplicated: Secondary | ICD-10-CM | POA: Insufficient documentation

## 2015-11-09 DIAGNOSIS — Z7982 Long term (current) use of aspirin: Secondary | ICD-10-CM | POA: Insufficient documentation

## 2015-11-09 DIAGNOSIS — Z3202 Encounter for pregnancy test, result negative: Secondary | ICD-10-CM | POA: Diagnosis not present

## 2015-11-09 DIAGNOSIS — R102 Pelvic and perineal pain: Secondary | ICD-10-CM

## 2015-11-09 DIAGNOSIS — E282 Polycystic ovarian syndrome: Secondary | ICD-10-CM | POA: Insufficient documentation

## 2015-11-09 DIAGNOSIS — R1030 Lower abdominal pain, unspecified: Secondary | ICD-10-CM | POA: Diagnosis present

## 2015-11-09 DIAGNOSIS — B9689 Other specified bacterial agents as the cause of diseases classified elsewhere: Secondary | ICD-10-CM

## 2015-11-09 LAB — URINALYSIS, ROUTINE W REFLEX MICROSCOPIC
BILIRUBIN URINE: NEGATIVE
GLUCOSE, UA: NEGATIVE mg/dL
KETONES UR: NEGATIVE mg/dL
Leukocytes, UA: NEGATIVE
Nitrite: NEGATIVE
PH: 5.5 (ref 5.0–8.0)
Protein, ur: NEGATIVE mg/dL

## 2015-11-09 LAB — CBC WITH DIFFERENTIAL/PLATELET
BASOS ABS: 0 10*3/uL (ref 0.0–0.1)
BASOS PCT: 1 %
EOS PCT: 1 %
Eosinophils Absolute: 0.1 10*3/uL (ref 0.0–0.7)
HEMATOCRIT: 37.6 % (ref 36.0–46.0)
Hemoglobin: 12.4 g/dL (ref 12.0–15.0)
LYMPHS PCT: 39 %
Lymphs Abs: 2.6 10*3/uL (ref 0.7–4.0)
MCH: 30.1 pg (ref 26.0–34.0)
MCHC: 33 g/dL (ref 30.0–36.0)
MCV: 91.3 fL (ref 78.0–100.0)
Monocytes Absolute: 0.3 10*3/uL (ref 0.1–1.0)
Monocytes Relative: 5 %
NEUTROS ABS: 3.6 10*3/uL (ref 1.7–7.7)
Neutrophils Relative %: 54 %
PLATELETS: 259 10*3/uL (ref 150–400)
RBC: 4.12 MIL/uL (ref 3.87–5.11)
RDW: 12.5 % (ref 11.5–15.5)
WBC: 6.5 10*3/uL (ref 4.0–10.5)

## 2015-11-09 LAB — URINE MICROSCOPIC-ADD ON

## 2015-11-09 LAB — WET PREP, GENITAL
SPERM: NONE SEEN
Trich, Wet Prep: NONE SEEN
Yeast Wet Prep HPF POC: NONE SEEN

## 2015-11-09 LAB — POCT PREGNANCY, URINE: Preg Test, Ur: NEGATIVE

## 2015-11-09 MED ORDER — METRONIDAZOLE 500 MG PO TABS: 500.0000 mg | ORAL_TABLET | Freq: Two times a day (BID) | ORAL | Status: DC

## 2015-11-09 NOTE — Discharge Instructions (Signed)

## 2015-11-09 NOTE — MAU Note (Signed)
Sometimes have irreg periods but have been regular for past 5 cycles. Late with period this month. Has not done upt because with last pregnancy my upt was negative until 12 wks. Having some lower abd pain and some clear vag discharge. I checked my cervix and it is high.

## 2015-11-09 NOTE — Progress Notes (Signed)
Written and verbal d/c instructions given and understanding voiced. 

## 2015-11-09 NOTE — MAU Provider Note (Signed)
Chief Complaint:  No chief complaint on file.   First Provider Initiated Contact with Patient 11/09/15 2149    HPI  Jodi Daniel is a 20 y.o. [id number] who presents to maternity admissions reporting lower abdominal pain for several days and a late period.  States periods are irregular, but just feels different and is sure she is pregnant..  States UPT was never positive until after 12 weeks with her other baby, but record reveals a + UPT at 7 weeks then.  She reports no vaginal bleeding, vaginal itching/burning, urinary symptoms, h/a, dizziness, n/v, or fever/chills.    RN Note: Sometimes have irreg periods but have been regular for past 5 cycles. Late with period this month. Has not done upt because with last pregnancy my upt was negative until 12 wks. Having some lower abd pain and some clear vag discharge. I checked my cervix and it is high.          Past Medical History: Past Medical History  Diagnosis Date  . UTI (lower urinary tract infection)   . Asthma   . Obese   . Anxiety   . Headache(784.0)   . Panic   . PCOS (polycystic ovarian syndrome)     Past obstetric history: OB History  Gravida Para Term Preterm AB SAB TAB Ectopic Multiple Living  # Outcome Date GA Lbr Len/2nd Weight Sex Delivery Anes PTL Lv  2 Preterm 08/24/11    F CS-LTranv   Y  1 SAB               Past Surgical History: Past Surgical History  Procedure Laterality Date  . Cesarean section    . Hernia repair    . Fracture surgery      thumb    Family History: Family History  Problem Relation Age of Onset  . Diabetes Mother   . Migraines Mother   . Heart murmur Brother   . Cancer Paternal Grandfather   . Hypertension Mother     Social History: Social History  Substance Use Topics  . Smoking status: Former Smoker    Types: Cigars  . Smokeless tobacco: Never Used  . Alcohol Use: Yes     Comment: Occasional    Allergies: No Known Allergies  Meds:   Prescriptions prior to admission  Medication Sig Dispense Refill Last Dose  . albuterol (PROVENTIL HFA;VENTOLIN HFA) 108 (90 BASE) MCG/ACT inhaler Inhale 2 puffs into the lungs every 4 (four) hours as needed for wheezing or shortness of breath. 1 Inhaler 2 Past Week at Unknown time  . aspirin-acetaminophen-caffeine (EXCEDRIN MIGRAINE) 250-250-65 MG per tablet Take 1 tablet by mouth every 6 (six) hours as needed for headache.   more than one month  . ibuprofen (ADVIL,MOTRIN) 600 MG tablet Take 1 tablet (600 mg total) by mouth every 6 (six) hours as needed. 30 tablet 1   . metroNIDAZOLE (FLAGYL) 500 MG tablet Take 1 tablet (500 mg total) by mouth 2 (two) times daily. 14 tablet 0    Results for orders placed or performed during the hospital encounter of 11/09/15 (from the past 48 hour(s))  Urinalysis, Routine w reflex microscopic (not at [facility])     Status: Abnormal   Collection Time: 11/09/15  9:45 PM  Result Value Ref Range   Color, Urine YELLOW YELLOW   APPearance CLEAR CLEAR   Specific Gravity, Urine <1.005 (L) 1.005 - 1.030   pH 5.5  5.0 - 8.0   Glucose, UA NEGATIVE NEGATIVE mg/dL   Hgb urine dipstick TRACE (A) NEGATIVE   Bilirubin Urine NEGATIVE NEGATIVE   Ketones, ur NEGATIVE NEGATIVE mg/dL   Protein, ur NEGATIVE NEGATIVE mg/dL   Nitrite NEGATIVE NEGATIVE   Leukocytes, UA NEGATIVE NEGATIVE  Urine microscopic-add on     Status: Abnormal   Collection Time: 11/09/15  9:45 PM  Result Value Ref Range   Squamous Epithelial / LPF 0-5 (A) NONE SEEN   WBC, UA 0-5 0 - 5 WBC/hpf   RBC / HPF 0-5 0 - 5 RBC/hpf   Bacteria, UA RARE (A) NONE SEEN  Pregnancy, urine POC     Status: None   Collection Time: 11/09/15  9:56 PM  Result Value Ref Range   Preg Test, Ur NEGATIVE NEGATIVE    Comment:        THE SENSITIVITY OF THIS METHODOLOGY IS >24 mIU/mL   Wet prep, genital     Status: Abnormal   Collection Time: 11/09/15 10:10 PM  Result Value Ref Range   Yeast Wet Prep HPF POC NONE SEEN NONE SEEN    Trich, Wet Prep NONE SEEN NONE SEEN   Clue Cells Wet Prep HPF POC PRESENT (A) NONE SEEN   WBC, Wet Prep HPF POC MANY (A) NONE SEEN    Comment: MANY BACTERIA SEEN   Sperm NONE SEEN   CBC with Differential     Status: None (Preliminary result)   Collection Time: 11/09/15 10:33 PM  Result Value Ref Range   WBC 6.5 4.0 - 10.5 K/uL   RBC 4.12 3.87 - 5.11 MIL/uL   Hemoglobin 12.4 12.0 - 15.0 g/dL   HCT 40.937.6 81.136.0 - 91.446.0 %   MCV 91.3 78.0 - 100.0 fL   MCH 30.1 26.0 - 34.0 pg   MCHC 33.0 30.0 - 36.0 g/dL   RDW 78.212.5 95.611.5 - 21.315.5 %   Platelets 259 150 - 400 K/uL   Neutrophils Relative % 54 %   Neutro Abs 3.6 1.7 - 7.7 K/uL   Lymphocytes Relative 39 %   Lymphs Abs 2.6 0.7 - 4.0 K/uL   Monocytes Relative 5 %   Monocytes Absolute 0.3 0.1 - 1.0 K/uL   Eosinophils Relative 1 %   Eosinophils Absolute 0.1 0.0 - 0.7 K/uL   Basophils Relative 1 %   Basophils Absolute 0.0 0.0 - 0.1 K/uL   Other PENDING %     I have reviewed patient's Past Medical Hx, Surgical Hx, Family Hx, Social Hx, medications and allergies.  ROS:  Review of Systems  Constitutional: Negative for fever and chills.  Gastrointestinal: Positive for abdominal pain and constipation (chronic). Negative for nausea, vomiting, diarrhea and abdominal distention.  Genitourinary: Positive for menstrual problem and pelvic pain. Negative for dysuria, flank pain, vaginal bleeding and vaginal discharge.  Musculoskeletal: Negative for myalgias and back pain.  Neurological: Negative for dizziness.   Other systems negative   Physical Exam  Patient Vitals for the past 24 hrs:  BP Temp Pulse Resp Height Weight  11/09/15 2140 134/79 mmHg 98.3 F (36.8 C) (!) 18 18 5\' 9"  (1.753 m) 82.645 kg (182 lb 3.2 oz)   Constitutional: Well-developed, well-nourished female in no acute distress.  Cardiovascular: normal rate and rhythm, no ectopy audible, S1 & S2 heard, no murmur Respiratory: normal effort, no distress. Lungs CTAB  GI: Abd soft,  mildly tender over lower abdomen.  Nondistended.  No rebound, No guarding.  Bowel Sounds audible  MS: Extremities nontender,  no edema, normal ROM Neurologic: Alert and oriented x 4.   Grossly nonfocal. GU: Neg CVAT. Skin:  Warm and Dry Psych:  Affect appropriate.  PELVIC EXAM: Cervix pink, visually closed, without lesion, scant yellow/white creamy discharge, vaginal walls and external genitalia normal Bimanual exam: Cervix firm, anterior, neg CMT, uterus nontender, nonenlarged, adnexa with some  Tenderness bilaterally, enlargement, or mass    Labs:  Imaging:  None  MAU Course/MDM: I have ordered labs as follows:  UA, UPT, GC/Chlamydia, wet prep, CBC  Imaging ordered: none Results reviewed.  Reviewed not pregnant per pregnancy test.  Will check CBC to help rule out appendicitis, which is doubtful based on exam.  Will check UA for sign of UTI:  Essentially negative     Assessment:  Plan: Care turned over to Venia Carbon NP Received care of the patient at 2241; CBC pending     Medication List    ASK your doctor about these medications        albuterol 108 (90 Base) MCG/ACT inhaler  Commonly known as:  PROVENTIL HFA;VENTOLIN HFA  Inhale 2 puffs into the lungs every 4 (four) hours as needed for wheezing or shortness of breath.     aspirin-acetaminophen-caffeine 250-250-65 MG tablet  Commonly known as:  EXCEDRIN MIGRAINE  Take 1 tablet by mouth every 6 (six) hours as needed for headache.     ibuprofen 600 MG tablet  Commonly known as:  ADVIL,MOTRIN  Take 1 tablet (600 mg total) by mouth every 6 (six) hours as needed.     metroNIDAZOLE 500 MG tablet  Commonly known as:  FLAGYL  Take 1 tablet (500 mg total) by mouth 2 (two) times daily.        Wynelle Bourgeois CNM, MSN Certified Nurse-Midwife 11/09/2015 9:50 PM   A:  1. Pelvic pain in female   2. Encounter for pregnancy test with result negative   3. BV (bacterial vaginosis)     P:  Discharge home in stable  condition GC pending Return to MAU if symptoms worsen  RX: Flagyl    Duane Lope, NP 11/10/2015 12:00 AM

## 2015-11-12 LAB — GC/CHLAMYDIA PROBE AMP (~~LOC~~) NOT AT ARMC
Chlamydia: NEGATIVE
NEISSERIA GONORRHEA: NEGATIVE

## 2016-02-04 DIAGNOSIS — F331 Major depressive disorder, recurrent, moderate: Secondary | ICD-10-CM | POA: Diagnosis present

## 2016-02-04 DIAGNOSIS — J45909 Unspecified asthma, uncomplicated: Secondary | ICD-10-CM | POA: Diagnosis present

## 2016-02-04 HISTORY — DX: Major depressive disorder, recurrent, moderate: F33.1

## 2016-03-10 ENCOUNTER — Emergency Department (HOSPITAL_COMMUNITY)
Admission: EM | Admit: 2016-03-10 | Discharge: 2016-03-10 | Disposition: A | Discharge status: Home / Self Care | Payer: Medicaid Other | Attending: Emergency Medicine | Admitting: Emergency Medicine

## 2016-03-10 ENCOUNTER — Encounter (HOSPITAL_COMMUNITY): Payer: Self-pay | Admitting: Emergency Medicine

## 2016-03-10 DIAGNOSIS — J45909 Unspecified asthma, uncomplicated: Secondary | ICD-10-CM | POA: Insufficient documentation

## 2016-03-10 DIAGNOSIS — Z87891 Personal history of nicotine dependence: Secondary | ICD-10-CM | POA: Diagnosis not present

## 2016-03-10 DIAGNOSIS — Z202 Contact with and (suspected) exposure to infections with a predominantly sexual mode of transmission: Secondary | ICD-10-CM | POA: Diagnosis not present

## 2016-03-10 DIAGNOSIS — R300 Dysuria: Secondary | ICD-10-CM | POA: Diagnosis present

## 2016-03-10 DIAGNOSIS — Z7982 Long term (current) use of aspirin: Secondary | ICD-10-CM | POA: Diagnosis not present

## 2016-03-10 DIAGNOSIS — Z79899 Other long term (current) drug therapy: Secondary | ICD-10-CM | POA: Insufficient documentation

## 2016-03-10 DIAGNOSIS — N390 Urinary tract infection, site not specified: Secondary | ICD-10-CM | POA: Insufficient documentation

## 2016-03-10 HISTORY — DX: Other specified bacterial agents as the cause of diseases classified elsewhere: N76.0

## 2016-03-10 HISTORY — DX: Other specified bacterial agents as the cause of diseases classified elsewhere: B96.89

## 2016-03-10 LAB — URINE MICROSCOPIC-ADD ON

## 2016-03-10 LAB — URINALYSIS, ROUTINE W REFLEX MICROSCOPIC
Bilirubin Urine: NEGATIVE
GLUCOSE, UA: NEGATIVE mg/dL
Ketones, ur: NEGATIVE mg/dL
Nitrite: NEGATIVE
PH: 6 (ref 5.0–8.0)
PROTEIN: NEGATIVE mg/dL
SPECIFIC GRAVITY, URINE: 1.004 — AB (ref 1.005–1.030)

## 2016-03-10 LAB — WET PREP, GENITAL
CLUE CELLS WET PREP: NONE SEEN
SPERM: NONE SEEN
Trich, Wet Prep: NONE SEEN
Yeast Wet Prep HPF POC: NONE SEEN

## 2016-03-10 MED ORDER — CEFTRIAXONE SODIUM 250 MG IJ SOLR
250.0000 mg | Freq: Once | INTRAMUSCULAR | Status: AC
  Administered 2016-03-10: 250 mg via INTRAMUSCULAR
  Filled 2016-03-10: qty 250

## 2016-03-10 MED ORDER — STERILE WATER FOR INJECTION IJ SOLN
INTRAMUSCULAR | Status: AC
  Administered 2016-03-10: 0.9 mL
  Filled 2016-03-10: qty 10

## 2016-03-10 MED ORDER — FOSFOMYCIN TROMETHAMINE 3 G PO PACK
3.0000 g | PACK | Freq: Once | ORAL | Status: AC
  Administered 2016-03-10: 3 g via ORAL
  Filled 2016-03-10: qty 3

## 2016-03-10 MED ORDER — AZITHROMYCIN 250 MG PO TABS
1000.0000 mg | ORAL_TABLET | Freq: Once | ORAL | Status: AC
  Administered 2016-03-10: 1000 mg via ORAL
  Filled 2016-03-10: qty 4

## 2016-03-10 MED ORDER — SODIUM CHLORIDE 0.9 % IV BOLUS (SEPSIS): 500.0000 mL | Freq: Once | INTRAVENOUS | Status: DC

## 2016-03-10 NOTE — ED Provider Notes (Signed)
[facility]-EMERGENCY DEPT Provider Note   CSN: [id number] Arrival date & time: 03/10/16  0619     History   Chief Complaint Chief Complaint  Patient presents with  . Dysuria    HPI  Blood pressure 126/56, pulse 87, temperature 97.7 F (36.5 C), temperature source Oral, resp. rate 16, height 5\' 9"  (1.753 m), weight 81.6 kg, last menstrual period 02/25/2016, SpO2 100 %.  Jodi Daniel is a 20 y.o. female complaining of dysuria without hematuria onset this morning, she also had unprotected sex with an ex-boyfriend last week, would like to be checked for STDs. She has had vaginal discharge onset 2 weeks ago with no fever, chills, nausea, vomiting, abdominal pain. States she had a light period last week.  HPI  Past Medical History:  Diagnosis Date  . Anxiety   . Asthma   . BV (bacterial vaginosis)   . Headache(784.0)   . Obese   . Panic   . PCOS (polycystic ovarian syndrome)   . UTI (lower urinary tract infection)     Patient Active Problem List   Diagnosis Date Noted  . Pregnancy, supervision of normal 02/25/2011    Past Surgical History:  Procedure Laterality Date  . CESAREAN SECTION    . FRACTURE SURGERY     thumb  . HERNIA REPAIR      OB History    Gravida Para Term Preterm AB Living   2 1   1 1 1    SAB TAB Ectopic Multiple Live Births   1       1       Home Medications    Prior to Admission medications   Medication Sig Start Date End Date Taking? Authorizing Provider  albuterol (PROVENTIL HFA;VENTOLIN HFA) 108 (90 BASE) MCG/ACT inhaler Inhale 2 puffs into the lungs every 4 (four) hours as needed for wheezing or shortness of breath. 06/30/13  Yes Gilda Crease, MD  albuterol (PROVENTIL) (2.5 MG/3ML) 0.083% nebulizer solution Inhale 2.5 mg into the lungs every 4 (four) hours as needed for shortness of breath. 02/04/16  Yes Historical Provider, MD  aspirin-acetaminophen-caffeine (EXCEDRIN MIGRAINE) 250-250-65 MG per tablet Take 1 tablet by mouth every  6 (six) hours as needed for headache.   Yes Historical Provider, MD  beclomethasone (QVAR) 80 MCG/ACT inhaler Inhale 2 puffs into the lungs 2 (two) times daily. 02/08/16  Yes Historical Provider, MD  sertraline (ZOLOFT) 50 MG tablet Take 50 mg by mouth once a week. 02/04/16  Yes Historical Provider, MD    Family History Family History  Problem Relation Age of Onset  . Diabetes Mother   . Migraines Mother   . Hypertension Mother   . Heart murmur Brother   . Cancer Paternal Grandfather     Social History Social History  Substance Use Topics  . Smoking status: Former Smoker    Types: Cigars  . Smokeless tobacco: Never Used  . Alcohol use Yes     Comment: Occasional     Allergies   Review of patient's allergies indicates no known allergies.   Review of Systems Review of Systems  10 systems reviewed and found to be negative, except as noted in the HPI.   Physical Exam Updated Vital Signs BP 125/89 (BP Location: Right Arm)   Pulse 87   Temp 97.8 F (36.6 C) (Oral)   Resp 16   Ht 5\' 9"  (1.753 m)   Wt 81.6 kg   LMP 02/25/2016   SpO2 100%   BMI 26.58 kg/m  Physical Exam  Constitutional: She is oriented to person, place, and time. She appears well-developed and well-nourished. No distress.  HENT:  Head: Normocephalic and atraumatic.  Mouth/Throat: Oropharynx is clear and moist.  Eyes: Conjunctivae and EOM are normal. Pupils are equal, round, and reactive to light.  Neck: Normal range of motion.  Cardiovascular: Normal rate, regular rhythm and intact distal pulses.   Pulmonary/Chest: Effort normal and breath sounds normal. No respiratory distress. She has no wheezes. She has no rales. She exhibits no tenderness.  Abdominal: Soft. She exhibits no distension and no mass. There is no tenderness. There is no rebound and no guarding. No hernia.  Genitourinary:  Genitourinary Comments: Pelvic exam a chaperoned by technician: No rashes or lesions, no abnormal vaginal  discharge, no cervical motion or adnexal tenderness.  Musculoskeletal: Normal range of motion.  Neurological: She is alert and oriented to person, place, and time.  Skin: She is not diaphoretic.  Psychiatric: She has a normal mood and affect.  Nursing note and vitals reviewed.    ED Treatments / Results  Labs (all labs ordered are listed, but only abnormal results are displayed) Labs Reviewed  WET PREP, GENITAL - Abnormal; Notable for the following:       Result Value   WBC, Wet Prep HPF POC FEW (*)    All other components within normal limits  URINALYSIS, ROUTINE W REFLEX MICROSCOPIC (NOT AT [facility]) - Abnormal; Notable for the following:    Specific Gravity, Urine 1.004 (*)    Hgb urine dipstick SMALL (*)    Leukocytes, UA MODERATE (*)    All other components within normal limits  URINE MICROSCOPIC-ADD ON - Abnormal; Notable for the following:    Squamous Epithelial / LPF 0-5 (*)    Bacteria, UA FEW (*)    All other components within normal limits  URINE CULTURE  RPR  HIV ANTIBODY (ROUTINE TESTING)  POC URINE PREG, ED  GC/CHLAMYDIA PROBE AMP ([place]) NOT AT [facility]    EKG  EKG Interpretation None       Radiology No results found.  Procedures Procedures (including critical care time)  Medications Ordered in ED Medications  sodium chloride 0.9 % bolus 500 mL (500 mLs Intravenous Not Given 03/10/16 0858)  azithromycin (ZITHROMAX) tablet 1,000 mg (1,000 mg Oral Given 03/10/16 0746)  cefTRIAXone (ROCEPHIN) injection 250 mg (250 mg Intramuscular Given 03/10/16 0747)  sterile water (preservative free) injection (0.9 mLs  Given 03/10/16 0747)  fosfomycin (MONUROL) packet 3 g (3 g Oral Given 03/10/16 0948)     Initial Impression / Assessment and Plan / ED Course  I have reviewed the triage vital signs and the nursing notes.  Pertinent labs & imaging results that were available during my care of the patient were reviewed by me and considered in my medical decision making  (see chart for details).  Clinical Course  Value Comment By Time   [facility] Kerryn Tennant, PA-C 08/28 [id number]  Specific Gravity, Urine: (!) 1.004 (Reviewed) Wynetta EmeryNicole Kirsta Probert, PA-C 08/28 [id number]    Vitals:   03/10/16 [id number] 03/10/16 [id number] 03/10/16 0850 03/10/16 0951  BP: 126/56  113/69 125/89  Pulse: 87  72 87  Resp: 16  18 16   Temp: 97.7 F (36.5 C)  98.1 F (36.7 C) 97.8 F (36.6 C)  TempSrc: Oral  Oral Oral  SpO2: 100%  100% 100%  Weight:  81.6 kg    Height:  5\' 9"  (1.753 m)      Medications  sodium chloride 0.9 % bolus 500 mL (500 mLs Intravenous Not Given 03/10/16 0858)  azithromycin (ZITHROMAX) tablet 1,000 mg (1,000 mg Oral Given 03/10/16 0746)  cefTRIAXone (ROCEPHIN) injection 250 mg (250 mg Intramuscular Given 03/10/16 0747)  sterile water (preservative free) injection (0.9 mLs  Given 03/10/16 0747)  fosfomycin (MONUROL) packet 3 g (3 g Oral Given 03/10/16 0948)    Mikea Quadros is 20 y.o. female presenting with Dysuria and requesting STD check, no signs of systemic infection, abdominal exam is benign, pelvic exam without signs of PID. Wet prep unremarkable.  I discussed findings and offered treatment for GC/Clamydia today in the ED. I explained to Pt that results of GC/Chlamidia testing are pending and that they may come back negative. Discussed pros and cons of treatment. Pt opted for treatment today. Pt will be given 250mg  of rocephin IM and 1g Azithromycin PO.   Urinalysis with moderate leukocytes, small hemoglobin, 6-30 whites and a few bacteria, urine culture ordered, fosfomycin given.  Evaluation does not show pathology that would require ongoing emergent intervention or inpatient treatment. Pt is hemodynamically stable and mentating appropriately. Discussed findings and plan with patient/guardian, who agrees with care plan. All questions answered. Return precautions discussed and outpatient follow up given.     Final Clinical Impressions(s) / ED  Diagnoses   Final diagnoses:  UTI (lower urinary tract infection)  Exposure to STD      [facility], PA-C 03/10/16 1610    Doug Sou, MD 03/10/16 1550

## 2016-03-10 NOTE — ED Triage Notes (Signed)
Pt c/o dysuria x 4 hours, white vaginal discharge x 1 week. Pt states she has recently had intercourse with ex and would like to be checked for Chlamydia. Denies n/v/d, denies abd pain

## 2016-03-10 NOTE — Discharge Instructions (Signed)
You were not tested for all STDs today. Your gonorrhea and chlamydia tests are pending- if they are positive, you will receive a phone call. Refrain from sex until you have the results from a full STD screen. Please go to [facility] (Address: [address], [place], [place] [address] Phone: [phone]) or see the [facility] (Address: [address] Phone: [phone]) for full STD screening. Return to the emergency room for worsening of symptoms, fever, and vomiting. ° °Do not hesitate to return to the emergency room for any new, worsening or concerning symptoms. ° °Please obtain primary care using resource guide below. Let them know that you were seen in the emergency room and that they will need to obtain records for further outpatient management. ° ° ° °

## 2016-03-10 NOTE — ED Notes (Signed)
Attempt IV twice to right hand and left AC unsuccessful. Pt grimacing and fidgeting with attempts stating "too painful." Joni ReiningNicole provider notified and states my hydrate with oral fluids. Pt given ice water.

## 2016-03-11 LAB — URINE CULTURE

## 2016-03-11 LAB — GC/CHLAMYDIA PROBE AMP (~~LOC~~) NOT AT ARMC
Chlamydia: NEGATIVE
Neisseria Gonorrhea: NEGATIVE

## 2016-03-11 LAB — HIV ANTIBODY (ROUTINE TESTING W REFLEX): HIV SCREEN 4TH GENERATION: NONREACTIVE

## 2016-03-11 LAB — RPR: RPR Ser Ql: NONREACTIVE

## 2016-03-12 ENCOUNTER — Telehealth (HOSPITAL_COMMUNITY): Payer: Self-pay

## 2016-03-12 NOTE — Telephone Encounter (Signed)
Pt calling for STD results.  ID verified x 2.  Pt informed both Gonorrhea and Chlamydia are negative and HIV & RPR are non reactive

## 2016-04-08 ENCOUNTER — Emergency Department (HOSPITAL_COMMUNITY)
Admission: EM | Admit: 2016-04-08 | Discharge: 2016-04-08 | Disposition: A | Discharge status: Home / Self Care | Payer: Medicaid Other | Attending: Emergency Medicine | Admitting: Emergency Medicine

## 2016-04-08 ENCOUNTER — Emergency Department (HOSPITAL_COMMUNITY): Payer: Medicaid Other

## 2016-04-08 ENCOUNTER — Encounter (HOSPITAL_COMMUNITY): Payer: Self-pay | Admitting: Emergency Medicine

## 2016-04-08 DIAGNOSIS — J45909 Unspecified asthma, uncomplicated: Secondary | ICD-10-CM | POA: Insufficient documentation

## 2016-04-08 DIAGNOSIS — W2201XA Walked into wall, initial encounter: Secondary | ICD-10-CM | POA: Diagnosis not present

## 2016-04-08 DIAGNOSIS — M62838 Other muscle spasm: Secondary | ICD-10-CM

## 2016-04-08 DIAGNOSIS — Y929 Unspecified place or not applicable: Secondary | ICD-10-CM | POA: Insufficient documentation

## 2016-04-08 DIAGNOSIS — Y999 Unspecified external cause status: Secondary | ICD-10-CM | POA: Insufficient documentation

## 2016-04-08 DIAGNOSIS — Y939 Activity, unspecified: Secondary | ICD-10-CM | POA: Diagnosis not present

## 2016-04-08 DIAGNOSIS — Z7982 Long term (current) use of aspirin: Secondary | ICD-10-CM | POA: Insufficient documentation

## 2016-04-08 DIAGNOSIS — S4991XA Unspecified injury of right shoulder and upper arm, initial encounter: Secondary | ICD-10-CM | POA: Insufficient documentation

## 2016-04-08 DIAGNOSIS — Z87891 Personal history of nicotine dependence: Secondary | ICD-10-CM | POA: Insufficient documentation

## 2016-04-08 DIAGNOSIS — Z79899 Other long term (current) drug therapy: Secondary | ICD-10-CM | POA: Diagnosis not present

## 2016-04-08 NOTE — ED Triage Notes (Signed)
Patient here with complaints of right shoulder injury. Reports "I hit it on something". Pain 10/10.

## 2016-04-08 NOTE — Discharge Instructions (Signed)
Ice to the shoulder, heat to the Muscles between your shoulder and neck SEEK IMMEDIATE MEDICAL CARE IF: You have a fever. You cannot move your arm or shoulder. You develop numbness or tingling in your arms, hands, or fingers.

## 2016-04-08 NOTE — ED Provider Notes (Signed)
[facility]-EMERGENCY DEPT Provider Note   CSN: [id number] Arrival date & time: 04/08/16  1522  By signing my name below, I, Freida Busman, attest that this documentation has been prepared under the direction and in the presence of non-physician practitioner, Arthor Captain, PA-C. Electronically Signed: Freida Busman, Scribe. 04/08/2016. 5:15 PM.   History   Chief Complaint Chief Complaint  Patient presents with  . Shoulder Injury     The history is provided by the patient. No language interpreter was used.     HPI Comments:  Jodi Daniel is a 20 y.o. female who presents to the Emergency Department complaining of right posterior  shoulder pain s/p injury ~ 1500 today. Pt states she struck a wall with the side of her right arm but notes the area that hit the wall is not where she is experiencing the most pain. She denies numbness/tingling. Her pain is exacerbated with certain movements. No alleviating factors noted. Pt has no other complaints or symptoms at this time.   Past Medical History:  Diagnosis Date  . Anxiety   . Asthma   . BV (bacterial vaginosis)   . Headache(784.0)   . Obese   . Panic   . PCOS (polycystic ovarian syndrome)   . UTI (lower urinary tract infection)     Patient Active Problem List   Diagnosis Date Noted  . Pregnancy, supervision of normal 02/25/2011    Past Surgical History:  Procedure Laterality Date  . CESAREAN SECTION    . FRACTURE SURGERY     thumb  . HERNIA REPAIR      OB History    Gravida Para Term Preterm AB Living   2 1   1 1 1    SAB TAB Ectopic Multiple Live Births   1       1       Home Medications    Prior to Admission medications   Medication Sig Start Date End Date Taking? Authorizing Provider  albuterol (PROVENTIL HFA;VENTOLIN HFA) 108 (90 BASE) MCG/ACT inhaler Inhale 2 puffs into the lungs every 4 (four) hours as needed for wheezing or shortness of breath. 06/30/13   Gilda Crease, MD  albuterol (PROVENTIL)  (2.5 MG/3ML) 0.083% nebulizer solution Inhale 2.5 mg into the lungs every 4 (four) hours as needed for shortness of breath. 02/04/16   Historical Provider, MD  aspirin-acetaminophen-caffeine (EXCEDRIN MIGRAINE) 250-250-65 MG per tablet Take 1 tablet by mouth every 6 (six) hours as needed for headache.    Historical Provider, MD  beclomethasone (QVAR) 80 MCG/ACT inhaler Inhale 2 puffs into the lungs 2 (two) times daily. 02/08/16   Historical Provider, MD  sertraline (ZOLOFT) 50 MG tablet Take 50 mg by mouth once a week. 02/04/16   Historical Provider, MD    Family History Family History  Problem Relation Age of Onset  . Diabetes Mother   . Migraines Mother   . Hypertension Mother   . Heart murmur Brother   . Cancer Paternal Grandfather     Social History Social History  Substance Use Topics  . Smoking status: Former Smoker    Types: Cigars  . Smokeless tobacco: Never Used  . Alcohol use Yes     Comment: Occasional     Allergies   Review of patient's allergies indicates no known allergies.   Review of Systems Review of Systems  Constitutional: Negative for chills and fever.  Respiratory: Negative for shortness of breath.   Cardiovascular: Negative for chest pain.  Musculoskeletal: Positive for arthralgias  and myalgias.       RUE  Neurological: Negative for weakness and numbness.     Physical Exam Updated Vital Signs BP 126/80   Pulse 91   Temp 98.3 F (36.8 C)   Resp 18   LMP 02/25/2016   SpO2 96%   Physical Exam  Constitutional: She is oriented to person, place, and time. She appears well-developed and well-nourished. No distress.  HENT:  Head: Normocephalic and atraumatic.  Eyes: Conjunctivae are normal.  Cardiovascular: Normal rate.   Pulmonary/Chest: Effort normal.  Abdominal: She exhibits no distension.  Musculoskeletal: She exhibits tenderness.  Tenderness to right upper trapezius near right scapula No bony tenderness No deltoid or elbow  tenderness Passive ROM is normal  Pain with dropping the arm; Pt is guarding with trapezius and levator    Neurological: She is alert and oriented to person, place, and time.  Skin: Skin is warm and dry.  Psychiatric: She has a normal mood and affect.  Nursing note and vitals reviewed.    ED Treatments / Results  DIAGNOSTIC STUDIES:  Oxygen Saturation is 96% on RA, normal by my interpretation.    COORDINATION OF CARE:  5:12 PM Discussed treatment plan with pt at bedside and pt agreed to plan.  Labs (all labs ordered are listed, but only abnormal results are displayed) Labs Reviewed - No data to display  EKG  EKG Interpretation None       Radiology Dg Shoulder Right  Result Date: 04/08/2016 CLINICAL DATA:  Right shoulder pain. Hit a wall with shoulder and heard a pop. Pain radiates to the fingers. Initial encounter. EXAM: RIGHT SHOULDER - 2+ VIEW COMPARISON:  None. FINDINGS: There is no evidence of fracture or dislocation. There is no evidence of arthropathy or other focal bone abnormality. Soft tissues are unremarkable. IMPRESSION: Negative. Electronically Signed   By: Sebastian AcheAllen  Grady M.D.   On: 04/08/2016 16:40    Procedures Procedures (including critical care time)  Medications Ordered in ED Medications - No data to display   Initial Impression / Assessment and Plan / ED Course  I have reviewed the triage vital signs and the nursing notes.  Pertinent labs & imaging results that were available during my care of the patient were reviewed by me and considered in my medical decision making (see chart for details).  Clinical Course    DDX includes subacromial bursitis or a spontaneous reduction of shoulder dislocation, however I feel the  diagnosis is most likely muscle spasm of shoulder region. Patient X-Ray negative for obvious fracture or dislocation.  Pt advised to follow up with orthopedics. Conservative therapy recommended and discussed. Patient will be discharged  home & is agreeable with above plan. Returns precautions discussed. Pt appears safe for discharge.  Final Clinical Impressions(s) / ED Diagnoses   Final diagnoses:  Shoulder injury, right, initial encounter  Muscle spasm of right shoulder    New Prescriptions New Prescriptions   No medications on file   I personally performed the services described in this documentation, which was scribed in my presence. The recorded information has been reviewed and is accurate.       Arthor Captainbigail Tecla Mailloux, PA-C 04/08/16 1723    Canary Brimhristopher J Tegeler, MD 04/09/16 1140

## 2016-05-12 ENCOUNTER — Encounter (HOSPITAL_COMMUNITY): Payer: Self-pay | Admitting: *Deleted

## 2016-05-12 ENCOUNTER — Emergency Department (HOSPITAL_COMMUNITY)
Admission: EM | Admit: 2016-05-12 | Discharge: 2016-05-12 | Disposition: A | Discharge status: Home / Self Care | Payer: Medicaid Other | Attending: Emergency Medicine | Admitting: Emergency Medicine

## 2016-05-12 DIAGNOSIS — N390 Urinary tract infection, site not specified: Secondary | ICD-10-CM

## 2016-05-12 DIAGNOSIS — R319 Hematuria, unspecified: Secondary | ICD-10-CM | POA: Insufficient documentation

## 2016-05-12 DIAGNOSIS — Z79899 Other long term (current) drug therapy: Secondary | ICD-10-CM | POA: Insufficient documentation

## 2016-05-12 DIAGNOSIS — B9689 Other specified bacterial agents as the cause of diseases classified elsewhere: Secondary | ICD-10-CM | POA: Diagnosis not present

## 2016-05-12 DIAGNOSIS — Z87891 Personal history of nicotine dependence: Secondary | ICD-10-CM | POA: Diagnosis not present

## 2016-05-12 DIAGNOSIS — J45909 Unspecified asthma, uncomplicated: Secondary | ICD-10-CM | POA: Diagnosis not present

## 2016-05-12 DIAGNOSIS — N760 Acute vaginitis: Secondary | ICD-10-CM | POA: Insufficient documentation

## 2016-05-12 DIAGNOSIS — Z7982 Long term (current) use of aspirin: Secondary | ICD-10-CM | POA: Insufficient documentation

## 2016-05-12 LAB — URINALYSIS, ROUTINE W REFLEX MICROSCOPIC
BILIRUBIN URINE: NEGATIVE
Glucose, UA: NEGATIVE mg/dL
KETONES UR: NEGATIVE mg/dL
NITRITE: NEGATIVE
PH: 6 (ref 5.0–8.0)
Protein, ur: NEGATIVE mg/dL
SPECIFIC GRAVITY, URINE: 1.009 (ref 1.005–1.030)

## 2016-05-12 LAB — URINE MICROSCOPIC-ADD ON

## 2016-05-12 LAB — WET PREP, GENITAL
SPERM: NONE SEEN
Trich, Wet Prep: NONE SEEN
Yeast Wet Prep HPF POC: NONE SEEN

## 2016-05-12 LAB — PREGNANCY, URINE: PREG TEST UR: NEGATIVE

## 2016-05-12 MED ORDER — METRONIDAZOLE 500 MG PO TABS: 500.0000 mg | ORAL_TABLET | Freq: Two times a day (BID) | ORAL | 0 refills | Status: DC

## 2016-05-12 MED ORDER — CEPHALEXIN 500 MG PO CAPS: 500.0000 mg | ORAL_CAPSULE | Freq: Two times a day (BID) | ORAL | 0 refills | Status: DC

## 2016-05-12 MED ORDER — PHENAZOPYRIDINE HCL 200 MG PO TABS: 200.0000 mg | ORAL_TABLET | Freq: Three times a day (TID) | ORAL | 0 refills | Status: DC | PRN

## 2016-05-12 MED ORDER — PHENAZOPYRIDINE HCL 100 MG PO TABS
200.0000 mg | ORAL_TABLET | Freq: Once | ORAL | Status: AC
  Administered 2016-05-12: 200 mg via ORAL
  Filled 2016-05-12: qty 2

## 2016-05-12 NOTE — ED Triage Notes (Signed)
Pt states pain with urination and lower abdominal pain.  Thinks she has bv b/c it feels the same.

## 2016-05-12 NOTE — Discharge Instructions (Signed)
Take your antibiotics as prescribed until completed. Continue drinking fluids at home to remain hydrated. Your results for your STD labs performed in the ED today are still pending. You should receive results within the next 2-3 days. You will receive a call from the hospital if anything results positive. Notify your sexual partner of any positive results as they can be evaluated and treated as appropriate. Please follow up with a primary care provider from the Resource Guide provided below in one week if your symptoms have not improved. Please return to the Emergency Department if symptoms worsen or new onset of fever, abdominal pain, vomiting, vaginal bleeding, flank pain,.

## 2016-05-12 NOTE — ED Provider Notes (Signed)
[facility]-EMERGENCY DEPT Provider Note   CSN: [id number] Arrival date & time: 05/12/16  [id number]     History   Chief Complaint Chief Complaint  Patient presents with  . Urinary Tract Infection    HPI Jodi Daniel is a 20 y.o. female.  Patient is a 20 year old female with history of asthma and PCO S2 presents the ED with complaint of urinary symptoms. Patient states over the past week she has had dysuria, urinary frequency and hematuria. She reports having associated discomfort to her pelvic region during and immediately after urinating and intermittent nausea. She also endorses having vaginal discharge for the past week. Denies fever, chills, abdominal pain, vomiting, diarrhea, constipation, vaginal bleeding, rash. Patient states she has been taking over-the-counter Azo without relief. Patient reports she is sexually active with one partner but denies using condoms. LMP 03/23/16. Endorses abdominal surgical history of C-section.      Past Medical History:  Diagnosis Date  . Anxiety   . Asthma   . BV (bacterial vaginosis)   . Headache(784.0)   . Obese   . Panic   . PCOS (polycystic ovarian syndrome)   . UTI (lower urinary tract infection)     Patient Active Problem List   Diagnosis Date Noted  . Pregnancy, supervision of normal 02/25/2011    Past Surgical History:  Procedure Laterality Date  . CESAREAN SECTION    . FRACTURE SURGERY     thumb  . HERNIA REPAIR      OB History    Gravida Para Term Preterm AB Living   2 1   1 1 1    SAB TAB Ectopic Multiple Live Births   1       1       Home Medications    Prior to Admission medications   Medication Sig Start Date End Date Taking? Authorizing Provider  albuterol (PROVENTIL HFA;VENTOLIN HFA) 108 (90 BASE) MCG/ACT inhaler Inhale 2 puffs into the lungs every 4 (four) hours as needed for wheezing or shortness of breath. 06/30/13  Yes Gilda Creasehristopher J Pollina, MD  albuterol (PROVENTIL) (2.5 MG/3ML) 0.083% nebulizer  solution Inhale 2.5 mg into the lungs every 4 (four) hours as needed for shortness of breath. 02/04/16  Yes Historical Provider, MD  aspirin-acetaminophen-caffeine (EXCEDRIN MIGRAINE) 250-250-65 MG per tablet Take 1 tablet by mouth every 6 (six) hours as needed for headache.   Yes Historical Provider, MD  beclomethasone (QVAR) 80 MCG/ACT inhaler Inhale 2 puffs into the lungs 2 (two) times daily. 02/08/16   Historical Provider, MD  cephALEXin (KEFLEX) 500 MG capsule Take 1 capsule (500 mg total) by mouth 2 (two) times daily. 05/12/16   Barrett HenleNicole Elizabeth Nadeau, PA-C  metroNIDAZOLE (FLAGYL) 500 MG tablet Take 1 tablet (500 mg total) by mouth 2 (two) times daily. 05/12/16   Barrett HenleNicole Elizabeth Nadeau, PA-C  phenazopyridine (PYRIDIUM) 200 MG tablet Take 1 tablet (200 mg total) by mouth 3 (three) times daily as needed for pain. 05/12/16   Barrett HenleNicole Elizabeth Nadeau, PA-C  sertraline (ZOLOFT) 50 MG tablet Take 50 mg by mouth once a week. 02/04/16   Historical Provider, MD    Family History Family History  Problem Relation Age of Onset  . Diabetes Mother   . Migraines Mother   . Hypertension Mother   . Heart murmur Brother   . Cancer Paternal Grandfather     Social History Social History  Substance Use Topics  . Smoking status: Former Smoker    Types: Cigars  . Smokeless tobacco: Never  Used  . Alcohol use Yes     Comment: Occasional     Allergies   Review of patient's allergies indicates no known allergies.   Review of Systems Review of Systems  Gastrointestinal: Positive for nausea.  Genitourinary: Positive for dysuria, frequency, hematuria, pelvic pain and vaginal discharge.  All other systems reviewed and are negative.    Physical Exam Updated Vital Signs BP 107/66 (BP Location: Right Arm)   Pulse 77   Temp 98 F (36.7 C) (Oral)   Resp 16   Ht 5\' 9"  (1.753 m)   Wt 82.6 kg   LMP 03/23/2016   SpO2 100%   BMI 26.88 kg/m   Physical Exam  Constitutional: She is oriented to  person, place, and time. She appears well-developed and well-nourished. No distress.  HENT:  Head: Normocephalic and atraumatic.  Mouth/Throat: Oropharynx is clear and moist. No oropharyngeal exudate.  Eyes: Conjunctivae and EOM are normal. Right eye exhibits no discharge. Left eye exhibits no discharge. No scleral icterus.  Neck: Normal range of motion. Neck supple.  Cardiovascular: Normal rate, regular rhythm, normal heart sounds and intact distal pulses.   Pulmonary/Chest: Effort normal and breath sounds normal. No respiratory distress. She has no wheezes. She has no rales. She exhibits no tenderness.  Abdominal: Soft. Bowel sounds are normal. She exhibits no distension and no mass. There is no tenderness. There is no rebound and no guarding. No hernia.  No CVA tenderness  Musculoskeletal: Normal range of motion. She exhibits no edema.  Neurological: She is alert and oriented to person, place, and time.  Skin: Skin is warm and dry. She is not diaphoretic.  Nursing note and vitals reviewed.  Pelvic exam: normal external genitalia, vulva, vagina, cervix, uterus and adnexa, VULVA: normal appearing vulva with no masses, tenderness or lesions, VAGINA: normal appearing vagina with normal color and discharge, no lesions, vaginal discharge - clear, malodorous and mucoid, CERVIX: normal appearing cervix without discharge or lesions, WET MOUNT done - results: clue cells, white blood cells, DNA probe for chlamydia and GC obtained, UTERUS: uterus is normal size, shape, consistency and nontender, ADNEXA: normal adnexa in size, nontender and no masses, exam chaperoned by female tech.   ED Treatments / Results  Labs (all labs ordered are listed, but only abnormal results are displayed) Labs Reviewed  WET PREP, GENITAL - Abnormal; Notable for the following:       Result Value   Clue Cells Wet Prep HPF POC PRESENT (*)    WBC, Wet Prep HPF POC MANY (*)    All other components within normal limits    URINALYSIS, ROUTINE W REFLEX MICROSCOPIC (NOT [facility]) - Abnormal; Notable for the following:    APPearance CLOUDY (*)    Hgb urine dipstick LARGE (*)    Leukocytes, UA LARGE (*)    All other components within normal limits  URINE MICROSCOPIC-ADD ON - Abnormal; Notable for the following:    Squamous Epithelial / LPF 6-30 (*)    Bacteria, UA FEW (*)    All other components within normal limits  PREGNANCY, URINE  RPR  HIV ANTIBODY (ROUTINE TESTING)  GC/CHLAMYDIA PROBE AMP ([employer]) NOT [facility]    EKG  EKG Interpretation None       Radiology No results found.  Procedures Procedures (including critical care time)  Medications Ordered in ED Medications  phenazopyridine (PYRIDIUM) tablet 200 mg (200 mg Oral Given 05/12/16 1239)     Initial Impression / Assessment and Plan /  ED Course  I have reviewed the triage vital signs and the nursing notes.  Pertinent labs & imaging results that were available during my care of the patient were reviewed by me and considered in my medical decision making (see chart for details).  Clinical Course    Patient presents with dysuria and vaginal discharge that she has had for the past week. Denies fever, abdominal pain, vomiting. VSS. Exam revealed no abdominal tenderness. Pelvic exam revealed clear malodorous discharge in vaginal vault, no CMT or adnexal tenderness. Pregnancy negative. Wet prep positive for clue cells. UA consistent with UTI. Labs pending for GC/chlamydia, HIV and syphilis. Discussed results and pending labs with patient. Plan to discharge patient home with Flagyl and Keflex. Patient given information to follow up with PCP as needed. Plan to discharge patient home.  Final Clinical Impressions(s) / ED Diagnoses   Final diagnoses:  Urinary tract infection with hematuria, site unspecified  BV (bacterial vaginosis)    New Prescriptions New Prescriptions   CEPHALEXIN (KEFLEX) 500 MG CAPSULE    Take 1 capsule (500 mg  total) by mouth 2 (two) times daily.   METRONIDAZOLE (FLAGYL) 500 MG TABLET    Take 1 tablet (500 mg total) by mouth 2 (two) times daily.   PHENAZOPYRIDINE (PYRIDIUM) 200 MG TABLET    Take 1 tablet (200 mg total) by mouth 3 (three) times daily as needed for pain.     Satira Sark [place], [place] 05/12/16 1318    Pricilla Loveless, MD 05/21/16 [phone]

## 2016-05-13 LAB — HIV ANTIBODY (ROUTINE TESTING W REFLEX): HIV SCREEN 4TH GENERATION: NONREACTIVE

## 2016-05-13 LAB — GC/CHLAMYDIA PROBE AMP (~~LOC~~) NOT AT ARMC
CHLAMYDIA, DNA PROBE: NEGATIVE
NEISSERIA GONORRHEA: NEGATIVE

## 2016-05-13 LAB — RPR: RPR Ser Ql: NONREACTIVE

## 2016-10-22 ENCOUNTER — Emergency Department (HOSPITAL_COMMUNITY)
Admission: EM | Admit: 2016-10-22 | Discharge: 2016-10-22 | Disposition: A | Discharge status: Home / Self Care | Payer: Medicaid Other | Attending: Emergency Medicine | Admitting: Emergency Medicine

## 2016-10-22 ENCOUNTER — Encounter (HOSPITAL_COMMUNITY): Payer: Self-pay | Admitting: *Deleted

## 2016-10-22 DIAGNOSIS — R112 Nausea with vomiting, unspecified: Secondary | ICD-10-CM | POA: Insufficient documentation

## 2016-10-22 DIAGNOSIS — J45909 Unspecified asthma, uncomplicated: Secondary | ICD-10-CM | POA: Diagnosis not present

## 2016-10-22 DIAGNOSIS — Z7982 Long term (current) use of aspirin: Secondary | ICD-10-CM | POA: Insufficient documentation

## 2016-10-22 DIAGNOSIS — Z87891 Personal history of nicotine dependence: Secondary | ICD-10-CM | POA: Diagnosis not present

## 2016-10-22 NOTE — ED Provider Notes (Signed)
[facility]-EMERGENCY DEPT Provider Note   CSN: [id number] Arrival date & time: 10/22/16  1959  By signing my name below, I, Linna Darner, attest that this documentation has been prepared under the direction and in the presence of [facility], PA-C. Electronically Signed: Linna Darner, Scribe. 10/22/2016. 8:48 PM.  History   Chief Complaint Chief Complaint  Patient presents with  . Needs work note    The history is provided by the patient. No language interpreter was used.     HPI Comments: Jodi Daniel is a 21 y.o. female who presents to the Emergency Department requesting a work note. She states she ate [employer] last night and was nauseous upon waking this morning. She states she vomited 3 times today but has not had any episodes since noon. Patient reports her symptoms have resolved completely and she needs a physician's note in order to return to work. LMP 09/16/16. She denies fevers, diarrhea, constipation, difficulty urinating, dysuria, or any other associated symptoms.  Past Medical History:  Diagnosis Date  . Anxiety   . Asthma   . BV (bacterial vaginosis)   . Headache(784.0)   . Obese   . Panic   . PCOS (polycystic ovarian syndrome)   . UTI (lower urinary tract infection)     Patient Active Problem List   Diagnosis Date Noted  . Pregnancy, supervision of normal 02/25/2011    Past Surgical History:  Procedure Laterality Date  . CESAREAN SECTION    . FRACTURE SURGERY     thumb  . HERNIA REPAIR      OB History    Gravida Para Term Preterm AB Living   SAB TAB Ectopic Multiple Live Births   1       1       Home Medications    Prior to Admission medications   Medication Sig Start Date End Date Taking? Authorizing Provider  albuterol (PROVENTIL HFA;VENTOLIN HFA) 108 (90 BASE) MCG/ACT inhaler Inhale 2 puffs into the lungs every 4 (four) hours as needed for wheezing or shortness of breath. 06/30/13   Gilda Crease, MD  albuterol  (PROVENTIL) (2.5 MG/3ML) 0.083% nebulizer solution Inhale 2.5 mg into the lungs every 4 (four) hours as needed for shortness of breath. 02/04/16   Historical Provider, MD  aspirin-acetaminophen-caffeine (EXCEDRIN MIGRAINE) 250-250-65 MG per tablet Take 1 tablet by mouth every 6 (six) hours as needed for headache.    Historical Provider, MD  beclomethasone (QVAR) 80 MCG/ACT inhaler Inhale 2 puffs into the lungs 2 (two) times daily. 02/08/16   Historical Provider, MD  cephALEXin (KEFLEX) 500 MG capsule Take 1 capsule (500 mg total) by mouth 2 (two) times daily. 05/12/16   Barrett Henle, PA-C  metroNIDAZOLE (FLAGYL) 500 MG tablet Take 1 tablet (500 mg total) by mouth 2 (two) times daily. 05/12/16   Barrett Henle, PA-C  phenazopyridine (PYRIDIUM) 200 MG tablet Take 1 tablet (200 mg total) by mouth 3 (three) times daily as needed for pain. 05/12/16   Barrett Henle, PA-C  sertraline (ZOLOFT) 50 MG tablet Take 50 mg by mouth once a week. 02/04/16   Historical Provider, MD    Family History Family History  Problem Relation Age of Onset  . Diabetes Mother   . Migraines Mother   . Hypertension Mother   . Heart murmur Brother   . Cancer Paternal Grandfather     Social History Social History  Substance Use Topics  .  Smoking status: Former Smoker    Types: Cigars  . Smokeless tobacco: Never Used  . Alcohol use Yes     Comment: Occasional     Allergies   Patient has no known allergies.   Review of Systems Review of Systems  Constitutional: Negative for fever.  Gastrointestinal: Positive for nausea (resolved) and vomiting (resolved). Negative for constipation and diarrhea.  Genitourinary: Negative for difficulty urinating and dysuria.   Physical Exam Updated Vital Signs BP 124/63   Pulse 70   Temp 97.5 F (36.4 C) (Oral)   Resp 18   LMP 09/16/2016   SpO2 100%   Physical Exam  Constitutional: She is oriented to person, place, and time. She appears  well-developed and well-nourished. No distress.  HENT:  Head: Normocephalic and atraumatic.  Cardiovascular: Normal rate, regular rhythm and normal heart sounds.   No murmur heard. Pulmonary/Chest: Effort normal and breath sounds normal. No respiratory distress.  Abdominal: Soft. She exhibits no distension. There is no tenderness.  Musculoskeletal: She exhibits no edema.  Neurological: She is alert and oriented to person, place, and time.  Skin: Skin is warm and dry.  Nursing note and vitals reviewed.  ED Treatments / Results  Labs (all labs ordered are listed, but only abnormal results are displayed) Labs Reviewed - No data to display  EKG  EKG Interpretation None       Radiology No results found.  Procedures Procedures (including critical care time)  DIAGNOSTIC STUDIES: Oxygen Saturation is 100% on RA, normal by my interpretation.    Medications Ordered in ED Medications - No data to display   Initial Impression / Assessment and Plan / ED Course  I have reviewed the triage vital signs and the nursing notes.  Pertinent labs & imaging results that were available during my care of the patient were reviewed by me and considered in my medical decision making (see chart for details).    Jodi Daniel is a 21 y.o. female who presents to ED for work note. Endorses nausea and three episodes of vomiting that began this morning. All symptoms now resolved. Benign abdominal exam. Offered labs but patient declines saying she just needs work note. Strongly encouraged pregnancy test - LMP 3/06. Patient again declines Upreg. Work note provided. All questions answered.    Final Clinical Impressions(s) / ED Diagnoses   Final diagnoses:  Nausea and vomiting, intractability of vomiting not specified, unspecified vomiting type    New Prescriptions Discharge Medication List as of 10/22/2016  8:53 PM     I personally performed the services described in this documentation, which  was scribed in my presence. The recorded information has been reviewed and is accurate.    [facility] Tashe Purdon, PA-C 10/22/16 2151    Nira Conn, MD 10/23/16 [phone]

## 2016-10-22 NOTE — ED Triage Notes (Signed)
Pt states she felt nauseas and vomited earlier today and did not go to work. Pt states she now feels better and needs a note to return to work.

## 2016-10-22 NOTE — Discharge Instructions (Signed)
Follow up with your primary care provider if symptoms return.  Return to ER for new or worsening symptoms, any additional concerns.

## 2016-10-27 ENCOUNTER — Encounter (HOSPITAL_COMMUNITY): Payer: Self-pay | Admitting: Emergency Medicine

## 2016-10-27 DIAGNOSIS — Z5321 Procedure and treatment not carried out due to patient leaving prior to being seen by health care provider: Secondary | ICD-10-CM | POA: Diagnosis not present

## 2016-10-27 DIAGNOSIS — N899 Noninflammatory disorder of vagina, unspecified: Secondary | ICD-10-CM | POA: Insufficient documentation

## 2016-10-27 LAB — POC URINE PREG, ED: Preg Test, Ur: NEGATIVE

## 2016-10-27 NOTE — ED Triage Notes (Signed)
Pt reports having vaginal discharge and states that she feels like she has BV again. Pt states she has white colored discharge. Denies any urinary symptoms.

## 2016-10-28 ENCOUNTER — Emergency Department (HOSPITAL_COMMUNITY)
Admission: EM | Admit: 2016-10-28 | Discharge: 2016-10-28 | Disposition: A | Discharge status: Home / Self Care | Payer: Medicaid Other | Attending: Emergency Medicine | Admitting: Emergency Medicine

## 2016-10-28 NOTE — ED Notes (Signed)
Pt called, no responce 

## 2017-01-07 ENCOUNTER — Inpatient Hospital Stay (HOSPITAL_COMMUNITY)
Admission: AD | Admit: 2017-01-07 | Discharge: 2017-01-07 | Disposition: A | Discharge status: Home / Self Care | Payer: Medicaid Other | Source: Ambulatory Visit | Attending: Family Medicine | Admitting: Family Medicine

## 2017-01-07 ENCOUNTER — Encounter (HOSPITAL_COMMUNITY): Payer: Self-pay | Admitting: *Deleted

## 2017-01-07 ENCOUNTER — Inpatient Hospital Stay (HOSPITAL_COMMUNITY): Payer: Medicaid Other

## 2017-01-07 DIAGNOSIS — Z3A01 Less than 8 weeks gestation of pregnancy: Secondary | ICD-10-CM | POA: Insufficient documentation

## 2017-01-07 DIAGNOSIS — O99611 Diseases of the digestive system complicating pregnancy, first trimester: Secondary | ICD-10-CM | POA: Diagnosis not present

## 2017-01-07 DIAGNOSIS — F419 Anxiety disorder, unspecified: Secondary | ICD-10-CM | POA: Insufficient documentation

## 2017-01-07 DIAGNOSIS — Z9889 Other specified postprocedural states: Secondary | ICD-10-CM | POA: Insufficient documentation

## 2017-01-07 DIAGNOSIS — O34219 Maternal care for unspecified type scar from previous cesarean delivery: Secondary | ICD-10-CM | POA: Diagnosis not present

## 2017-01-07 DIAGNOSIS — K5901 Slow transit constipation: Secondary | ICD-10-CM

## 2017-01-07 DIAGNOSIS — E282 Polycystic ovarian syndrome: Secondary | ICD-10-CM | POA: Insufficient documentation

## 2017-01-07 DIAGNOSIS — R109 Unspecified abdominal pain: Secondary | ICD-10-CM

## 2017-01-07 DIAGNOSIS — O99341 Other mental disorders complicating pregnancy, first trimester: Secondary | ICD-10-CM | POA: Diagnosis not present

## 2017-01-07 DIAGNOSIS — O99211 Obesity complicating pregnancy, first trimester: Secondary | ICD-10-CM | POA: Insufficient documentation

## 2017-01-07 DIAGNOSIS — O9989 Other specified diseases and conditions complicating pregnancy, childbirth and the puerperium: Secondary | ICD-10-CM | POA: Diagnosis not present

## 2017-01-07 DIAGNOSIS — K5900 Constipation, unspecified: Secondary | ICD-10-CM | POA: Insufficient documentation

## 2017-01-07 DIAGNOSIS — E669 Obesity, unspecified: Secondary | ICD-10-CM | POA: Insufficient documentation

## 2017-01-07 DIAGNOSIS — O99511 Diseases of the respiratory system complicating pregnancy, first trimester: Secondary | ICD-10-CM | POA: Insufficient documentation

## 2017-01-07 DIAGNOSIS — O99281 Endocrine, nutritional and metabolic diseases complicating pregnancy, first trimester: Secondary | ICD-10-CM | POA: Diagnosis not present

## 2017-01-07 DIAGNOSIS — J45909 Unspecified asthma, uncomplicated: Secondary | ICD-10-CM | POA: Insufficient documentation

## 2017-01-07 DIAGNOSIS — O26891 Other specified pregnancy related conditions, first trimester: Secondary | ICD-10-CM | POA: Insufficient documentation

## 2017-01-07 DIAGNOSIS — Z79899 Other long term (current) drug therapy: Secondary | ICD-10-CM | POA: Insufficient documentation

## 2017-01-07 DIAGNOSIS — Z8249 Family history of ischemic heart disease and other diseases of the circulatory system: Secondary | ICD-10-CM | POA: Diagnosis not present

## 2017-01-07 DIAGNOSIS — Z809 Family history of malignant neoplasm, unspecified: Secondary | ICD-10-CM | POA: Diagnosis not present

## 2017-01-07 DIAGNOSIS — Z3491 Encounter for supervision of normal pregnancy, unspecified, first trimester: Secondary | ICD-10-CM

## 2017-01-07 DIAGNOSIS — O23511 Infections of cervix in pregnancy, first trimester: Secondary | ICD-10-CM | POA: Insufficient documentation

## 2017-01-07 DIAGNOSIS — Z833 Family history of diabetes mellitus: Secondary | ICD-10-CM | POA: Insufficient documentation

## 2017-01-07 DIAGNOSIS — Z87891 Personal history of nicotine dependence: Secondary | ICD-10-CM | POA: Insufficient documentation

## 2017-01-07 LAB — CBC
HCT: 37.1 % (ref 36.0–46.0)
HEMOGLOBIN: 12.2 g/dL (ref 12.0–15.0)
MCH: 30.4 pg (ref 26.0–34.0)
MCHC: 32.9 g/dL (ref 30.0–36.0)
MCV: 92.5 fL (ref 78.0–100.0)
Platelets: 240 10*3/uL (ref 150–400)
RBC: 4.01 MIL/uL (ref 3.87–5.11)
RDW: 12.6 % (ref 11.5–15.5)
WBC: 6.8 10*3/uL (ref 4.0–10.5)

## 2017-01-07 LAB — URINALYSIS, ROUTINE W REFLEX MICROSCOPIC
Bilirubin Urine: NEGATIVE
Glucose, UA: NEGATIVE mg/dL
Hgb urine dipstick: NEGATIVE
Ketones, ur: NEGATIVE mg/dL
LEUKOCYTES UA: NEGATIVE
NITRITE: NEGATIVE
PROTEIN: NEGATIVE mg/dL
SPECIFIC GRAVITY, URINE: 1.017 (ref 1.005–1.030)
pH: 6 (ref 5.0–8.0)

## 2017-01-07 LAB — POCT PREGNANCY, URINE: Preg Test, Ur: POSITIVE — AB

## 2017-01-07 LAB — HCG, QUANTITATIVE, PREGNANCY: HCG, BETA CHAIN, QUANT, S: 7021 m[IU]/mL — AB (ref <5)

## 2017-01-07 MED ORDER — NEBULIZER/ADULT MASK KIT: 1.0000 | PACK | Freq: Once | 0 refills | Status: AC

## 2017-01-07 MED ORDER — DOCUSATE SODIUM 100 MG PO CAPS: 100.0000 mg | ORAL_CAPSULE | Freq: Two times a day (BID) | ORAL | 2 refills | Status: DC

## 2017-01-07 NOTE — MAU Note (Signed)
+  HPT LMP 11/28/2016  +gerneralized abdominal pain Cramping in nature x2 days Rating pain 5/10  Patient states she feels like she may have a uti. Denies dysuria/

## 2017-01-07 NOTE — MAU Provider Note (Signed)
History     CSN: [id number]  Arrival date and time: 01/07/17 1356   None     Chief Complaint  Patient presents with  . Possible Pregnancy  . Abdominal Pain   Patient is a 21 year old G3 P1 at 5 weeks and 5 days by LMP who presents today for diffuse abdominal pain. She reports the pain feels like somebody blew up her stomach and now at shrinking and. She rates it is only up 3-4 out of 10. It is not localized at all. It is been present for about 48 hours. She denies any vaginal bleeding or vaginal discharge. She did have some heavy bleeding at the beginning of the month was seen at the [facility] and given a shot of Rocephin for cervicitis. She reports she was tested for STDs at that time.    OB History    Gravida Para Term Preterm AB Living   3 1   1 1 1    SAB TAB Ectopic Multiple Live Births   1       1      Past Medical History:  Diagnosis Date  . Anxiety   . Asthma   . BV (bacterial vaginosis)   . Headache(784.0)   . Obese   . Panic   . PCOS (polycystic ovarian syndrome)   . UTI (lower urinary tract infection)     Past Surgical History:  Procedure Laterality Date  . CESAREAN SECTION    . FRACTURE SURGERY     thumb  . HERNIA REPAIR      Family History  Problem Relation Age of Onset  . Diabetes Mother   . Migraines Mother   . Hypertension Mother   . Heart murmur Brother   . Cancer Paternal Grandfather     Social History  Substance Use Topics  . Smoking status: Former Smoker    Types: Cigars  . Smokeless tobacco: Never Used  . Alcohol use Yes     Comment: Occasional    Allergies: No Known Allergies  Prescriptions Prior to Admission  Medication Sig Dispense Refill Last Dose  . albuterol (PROVENTIL HFA;VENTOLIN HFA) 108 (90 BASE) MCG/ACT inhaler Inhale 2 puffs into the lungs every 4 (four) hours as needed for wheezing or shortness of breath. 1 Inhaler 2 Past Week at Unknown time  . albuterol (PROVENTIL) (2.5 MG/3ML) 0.083% nebulizer solution  Inhale 2.5 mg into the lungs every 4 (four) hours as needed for shortness of breath.   Past Week at Unknown time  . metroNIDAZOLE (FLAGYL) 500 MG tablet Take 1 tablet (500 mg total) by mouth 2 (two) times daily. 14 tablet 0 12/17/2016 at Unknown time  . cephALEXin (KEFLEX) 500 MG capsule Take 1 capsule (500 mg total) by mouth 2 (two) times daily. (Patient not taking: Reported on 01/07/2017) 14 capsule 0 Completed Course at Unknown time  . phenazopyridine (PYRIDIUM) 200 MG tablet Take 1 tablet (200 mg total) by mouth 3 (three) times daily as needed for pain. (Patient not taking: Reported on 01/07/2017) 10 tablet 0 12/17/2016 at Unknown time    Review of Systems  Constitutional: Negative for chills and fever.  HENT: Negative for congestion and rhinorrhea.   Respiratory: Negative for cough and shortness of breath.   Cardiovascular: Negative for chest pain and palpitations.  Gastrointestinal: Positive for abdominal pain. Negative for abdominal distention, constipation, diarrhea, nausea and vomiting.  Genitourinary: Negative for difficulty urinating, dysuria, frequency and hematuria.  Musculoskeletal: Negative for arthralgias and myalgias.  Neurological: Negative for  dizziness and weakness.  Psychiatric/Behavioral: Negative for agitation and confusion.   Physical Exam   Blood pressure 122/65, pulse 80, temperature 98.2 F (36.8 C), temperature source Oral, resp. rate 18, weight 184 lb 0.6 oz (83.5 kg), last menstrual period 11/28/2016, SpO2 100 %.  Physical Exam  Vitals reviewed. Constitutional: She is oriented to person, place, and time. She appears well-developed and well-nourished.  HENT:  Head: Normocephalic and atraumatic.  Cardiovascular: Normal rate and intact distal pulses.   Respiratory: Effort normal. No respiratory distress.  GI: Soft. She exhibits no distension. There is tenderness. There is no rebound and no guarding.  Mild diffuse abdominal tenderness  Genitourinary:  Genitourinary  Comments: Minimal thin white vaginal discharge no cervicitis present, no cervical motion tenderness.  Musculoskeletal: Normal range of motion. She exhibits no edema.  Neurological: She is alert and oriented to person, place, and time. No cranial nerve deficit.  Skin: Skin is dry.  Psychiatric: She has a normal mood and affect. Her behavior is normal.    MAU Course  Procedures  MDM In MA U patient underwent evaluation with CBC beta hCG as well as ultrasound. Ultrasound revealed a intrauterine pregnancy with gestational sac and yolk sac but no fetus or heartbeat visualized. We'll plan for repeat ultrasound in 14 days.  Patient does report some history of constipation which I think is contributing to her symptoms today.  Assessment and Plan  #1: Intrauterine pregnancy of unknown viability repeat ultrasound in 14 days, message sent to office for order and prior authorization #2: Constipation recommended Colace twice a day. #3: Asthma incidentally patient reports she has asthma and when it gets hot she occasionally needs to use her nebulizer. She currently does not have a mask portion prescription was sent to her pharmacy.  Ernestina Pennaicholas Schenk 01/07/2017, 3:46 PM

## 2017-01-07 NOTE — Discharge Instructions (Signed)

## 2017-01-08 ENCOUNTER — Telehealth: Payer: Self-pay | Admitting: General Practice

## 2017-01-08 DIAGNOSIS — O3680X1 Pregnancy with inconclusive fetal viability, fetus 1: Secondary | ICD-10-CM

## 2017-01-08 NOTE — Telephone Encounter (Signed)
-----   Message from Lorne SkeensNicholas Michael Schenk, MD sent at 01/07/2017  4:07 PM EDT ----- Patient seen in [facility] U needs repeat ultrasound ordered in 14 days. Please call and schedule with patient.

## 2017-01-08 NOTE — Telephone Encounter (Signed)
Scheduled ultrasound for 7/10 @ 9am. Called and informed patient. Patient verbalized understanding & had no questions

## 2017-01-19 ENCOUNTER — Ambulatory Visit (HOSPITAL_COMMUNITY): Payer: No Typology Code available for payment source

## 2017-01-20 ENCOUNTER — Encounter: Payer: Self-pay | Admitting: Family Medicine

## 2017-01-20 ENCOUNTER — Ambulatory Visit (HOSPITAL_COMMUNITY)
Admission: RE | Admit: 2017-01-20 | Discharge: 2017-01-20 | Disposition: A | Discharge status: Home / Self Care | Payer: Medicaid Other | Source: Ambulatory Visit | Attending: Obstetrics and Gynecology | Admitting: Obstetrics and Gynecology

## 2017-01-20 ENCOUNTER — Ambulatory Visit: Payer: Medicaid Other

## 2017-01-20 DIAGNOSIS — Z3A01 Less than 8 weeks gestation of pregnancy: Secondary | ICD-10-CM | POA: Diagnosis not present

## 2017-01-20 DIAGNOSIS — O3680X1 Pregnancy with inconclusive fetal viability, fetus 1: Secondary | ICD-10-CM | POA: Diagnosis present

## 2017-01-20 DIAGNOSIS — Z712 Person consulting for explanation of examination or test findings: Secondary | ICD-10-CM

## 2017-01-20 NOTE — Progress Notes (Signed)
Patient presented to office today for u/s results. At this time patient does has a viable pregnancy at this time around six weeks. Patient has been provided a letter of verification to apply for medicard. Patient was advised to start prenatal vitamins at this time.

## 2017-01-21 ENCOUNTER — Inpatient Hospital Stay (EMERGENCY_DEPARTMENT_HOSPITAL)
Admission: AD | Admit: 2017-01-21 | Discharge: 2017-01-21 | Disposition: A | Discharge status: Home / Self Care | Payer: Medicaid Other | Source: Ambulatory Visit | Attending: Family Medicine | Admitting: Family Medicine

## 2017-01-21 ENCOUNTER — Encounter (HOSPITAL_COMMUNITY): Payer: Self-pay | Admitting: *Deleted

## 2017-01-21 ENCOUNTER — Inpatient Hospital Stay (HOSPITAL_COMMUNITY): Payer: Medicaid Other

## 2017-01-21 ENCOUNTER — Emergency Department (HOSPITAL_COMMUNITY)
Admission: EM | Admit: 2017-01-21 | Discharge: 2017-01-21 | Disposition: A | Discharge status: Home / Self Care | Payer: Medicaid Other | Attending: Emergency Medicine | Admitting: Emergency Medicine

## 2017-01-21 ENCOUNTER — Encounter (HOSPITAL_COMMUNITY): Payer: Self-pay | Admitting: Emergency Medicine

## 2017-01-21 DIAGNOSIS — Z79899 Other long term (current) drug therapy: Secondary | ICD-10-CM | POA: Insufficient documentation

## 2017-01-21 DIAGNOSIS — R42 Dizziness and giddiness: Secondary | ICD-10-CM | POA: Diagnosis not present

## 2017-01-21 DIAGNOSIS — F17290 Nicotine dependence, other tobacco product, uncomplicated: Secondary | ICD-10-CM | POA: Diagnosis not present

## 2017-01-21 DIAGNOSIS — O4690 Antepartum hemorrhage, unspecified, unspecified trimester: Secondary | ICD-10-CM

## 2017-01-21 DIAGNOSIS — O039 Complete or unspecified spontaneous abortion without complication: Secondary | ICD-10-CM

## 2017-01-21 DIAGNOSIS — Z3A01 Less than 8 weeks gestation of pregnancy: Secondary | ICD-10-CM | POA: Insufficient documentation

## 2017-01-21 DIAGNOSIS — J45909 Unspecified asthma, uncomplicated: Secondary | ICD-10-CM | POA: Insufficient documentation

## 2017-01-21 DIAGNOSIS — R1030 Lower abdominal pain, unspecified: Secondary | ICD-10-CM | POA: Diagnosis not present

## 2017-01-21 DIAGNOSIS — N938 Other specified abnormal uterine and vaginal bleeding: Secondary | ICD-10-CM | POA: Diagnosis present

## 2017-01-21 DIAGNOSIS — N939 Abnormal uterine and vaginal bleeding, unspecified: Secondary | ICD-10-CM

## 2017-01-21 LAB — URINALYSIS, ROUTINE W REFLEX MICROSCOPIC
BILIRUBIN URINE: NEGATIVE
Bilirubin Urine: NEGATIVE
GLUCOSE, UA: NEGATIVE mg/dL
GLUCOSE, UA: NEGATIVE mg/dL
KETONES UR: NEGATIVE mg/dL
Ketones, ur: NEGATIVE mg/dL
LEUKOCYTES UA: NEGATIVE
Leukocytes, UA: NEGATIVE
NITRITE: NEGATIVE
Nitrite: NEGATIVE
PH: 6 (ref 5.0–8.0)
Protein, ur: NEGATIVE mg/dL
Protein, ur: NEGATIVE mg/dL
SPECIFIC GRAVITY, URINE: 1.021 (ref 1.005–1.030)
SPECIFIC GRAVITY, URINE: 1.016 (ref 1.005–1.030)
WBC UA: NONE SEEN WBC/hpf (ref 0–5)
pH: 5 (ref 5.0–8.0)

## 2017-01-21 LAB — CBC
HCT: 36.2 % (ref 36.0–46.0)
HEMATOCRIT: 35 % — AB (ref 36.0–46.0)
HEMOGLOBIN: 11.8 g/dL — AB (ref 12.0–15.0)
Hemoglobin: 11.8 g/dL — ABNORMAL LOW (ref 12.0–15.0)
MCH: 30.3 pg (ref 26.0–34.0)
MCH: 30.4 pg (ref 26.0–34.0)
MCHC: 32.6 g/dL (ref 30.0–36.0)
MCHC: 33.7 g/dL (ref 30.0–36.0)
MCV: 93.1 fL (ref 78.0–100.0)
MCV: 90.2 fL (ref 78.0–100.0)
PLATELETS: 225 10*3/uL (ref 150–400)
Platelets: 234 10*3/uL (ref 150–400)
RBC: 3.89 MIL/uL (ref 3.87–5.11)
RBC: 3.88 MIL/uL (ref 3.87–5.11)
RDW: 12.7 % (ref 11.5–15.5)
RDW: 12.4 % (ref 11.5–15.5)
WBC: 8.5 10*3/uL (ref 4.0–10.5)
WBC: 7.9 10*3/uL (ref 4.0–10.5)

## 2017-01-21 LAB — BASIC METABOLIC PANEL
ANION GAP: 8 (ref 5–15)
BUN: 11 mg/dL (ref 6–20)
CALCIUM: 8.8 mg/dL — AB (ref 8.9–10.3)
CO2: 20 mmol/L — ABNORMAL LOW (ref 22–32)
Chloride: 108 mmol/L (ref 101–111)
Creatinine, Ser: 0.76 mg/dL (ref 0.44–1.00)
GLUCOSE: 95 mg/dL (ref 65–99)
POTASSIUM: 3.7 mmol/L (ref 3.5–5.1)
SODIUM: 136 mmol/L (ref 135–145)

## 2017-01-21 LAB — WET PREP, GENITAL
CLUE CELLS WET PREP: NONE SEEN
SPERM: NONE SEEN
TRICH WET PREP: NONE SEEN
WBC WET PREP: NONE SEEN
Yeast Wet Prep HPF POC: NONE SEEN

## 2017-01-21 MED ORDER — KETOROLAC TROMETHAMINE 30 MG/ML IJ SOLN
30.0000 mg | Freq: Once | INTRAMUSCULAR | Status: AC
  Administered 2017-01-21: 30 mg via INTRAMUSCULAR
  Filled 2017-01-21: qty 1

## 2017-01-21 MED ORDER — KETOROLAC TROMETHAMINE 30 MG/ML IJ SOLN: 30.0000 mg | Freq: Once | INTRAMUSCULAR | Status: DC

## 2017-01-21 NOTE — MAU Provider Note (Signed)
History     CSN: [id number]  Arrival date and time: 01/21/17 0410   First Provider Initiated Contact with Patient 01/21/17 0435      Chief Complaint  Patient presents with  . Abdominal Pain    heavy vaginal bleeding   HPI Jodi Daniel is a 21 y.o. [id number] at [redacted]w[redacted]d who presents with vaginal bleeding. She states she woke up an hour ago and was bleeding heavily in her bed and passed clots when she went to the bathroom. She is also reporting lower abdominal cramping that she rates a 9/10 and has not tried anything for the pain. She had an ultrasound yesterday that showed a 6w 6d IUP with a heart rate.  OB History    Gravida Para Term Preterm AB Living   3 1   1 1 1    SAB TAB Ectopic Multiple Live Births   1       1      Past Medical History:  Diagnosis Date  . Anxiety   . Asthma   . BV (bacterial vaginosis)   . Headache(784.0)   . Obese   . Panic   . PCOS (polycystic ovarian syndrome)   . UTI (lower urinary tract infection)     Past Surgical History:  Procedure Laterality Date  . CESAREAN SECTION    . FRACTURE SURGERY     thumb  . HERNIA REPAIR      Family History  Problem Relation Age of Onset  . Diabetes Mother   . Migraines Mother   . Hypertension Mother   . Heart murmur Brother   . Cancer Paternal Grandfather     Social History  Substance Use Topics  . Smoking status: Former Smoker    Types: Cigars  . Smokeless tobacco: Never Used  . Alcohol use Yes     Comment: Occasional    Allergies: No Known Allergies  Prescriptions Prior to Admission  Medication Sig Dispense Refill Last Dose  . albuterol (PROVENTIL HFA;VENTOLIN HFA) 108 (90 BASE) MCG/ACT inhaler Inhale 2 puffs into the lungs every 4 (four) hours as needed for wheezing or shortness of breath. 1 Inhaler 2 Past Week at Unknown time  . albuterol (PROVENTIL) (2.5 MG/3ML) 0.083% nebulizer solution Inhale 2.5 mg into the lungs every 4 (four) hours as needed for shortness of breath.   Past Week at  Unknown time  . docusate sodium (COLACE) 100 MG capsule Take 1 capsule (100 mg total) by mouth 2 (two) times daily. 60 capsule 2   . metroNIDAZOLE (FLAGYL) 500 MG tablet Take 1 tablet (500 mg total) by mouth 2 (two) times daily. 14 tablet 0 12/17/2016 at Unknown time    Review of Systems  Constitutional: Negative.  Negative for chills and fever.  HENT: Negative.   Respiratory: Negative.  Negative for shortness of breath.   Cardiovascular: Negative.  Negative for chest pain.  Gastrointestinal: Positive for abdominal pain. Negative for constipation, diarrhea, nausea and vomiting.  Genitourinary: Positive for vaginal bleeding. Negative for dysuria and vaginal discharge.  Neurological: Negative.  Negative for dizziness and headaches.  Psychiatric/Behavioral: Negative.    Physical Exam   Blood pressure 126/76, pulse 85, temperature 97.9 F (36.6 C), temperature source Oral, resp. rate 17, last menstrual period 11/28/2016, SpO2 100 %.  Physical Exam  Nursing note and vitals reviewed. Constitutional: She appears well-developed and well-nourished.  HENT:  Head: Normocephalic and atraumatic.  Eyes: Conjunctivae are normal. No scleral icterus.  Cardiovascular: Normal rate, regular rhythm and normal heart  sounds.   Respiratory: Effort normal and breath sounds normal. No respiratory distress.  GI: Soft. She exhibits no distension. There is tenderness.  Genitourinary: There is bleeding in the vagina.  Genitourinary Comments: Moderate amount of bright red bleeding in vagina and coming from the os. Diffuse tenderness with bimanual exam  Neurological: She is alert.  Skin: Skin is warm and dry.  Psychiatric: She has a normal mood and affect. Her behavior is normal. Judgment and thought content normal.    MAU Course  Procedures Results for orders placed or performed during the hospital encounter of 01/21/17 (from the past 24 hour(s))  Urinalysis, Routine w reflex microscopic     Status: Abnormal    Collection Time: 01/21/17  4:25 AM  Result Value Ref Range   Color, Urine YELLOW YELLOW   APPearance CLEAR CLEAR   Specific Gravity, Urine 1.021 1.005 - 1.030   pH 6.0 5.0 - 8.0   Glucose, UA NEGATIVE NEGATIVE mg/dL   Hgb urine dipstick LARGE (A) NEGATIVE   Bilirubin Urine NEGATIVE NEGATIVE   Ketones, ur NEGATIVE NEGATIVE mg/dL   Protein, ur NEGATIVE NEGATIVE mg/dL   Nitrite NEGATIVE NEGATIVE   Leukocytes, UA NEGATIVE NEGATIVE   RBC / HPF TOO NUMEROUS TO COUNT 0 - 5 RBC/hpf   WBC, UA 0-5 0 - 5 WBC/hpf   Bacteria, UA RARE (A) NONE SEEN   Squamous Epithelial / LPF 6-30 (A) NONE SEEN   Mucous PRESENT   Wet prep, genital     Status: None   Collection Time: 01/21/17  4:45 AM  Result Value Ref Range   Yeast Wet Prep HPF POC NONE SEEN NONE SEEN   Trich, Wet Prep NONE SEEN NONE SEEN   Clue Cells Wet Prep HPF POC NONE SEEN NONE SEEN   WBC, Wet Prep HPF POC NONE SEEN NONE SEEN   Sperm NONE SEEN   CBC     Status: Abnormal   Collection Time: 01/21/17  4:57 AM  Result Value Ref Range   WBC 8.5 4.0 - 10.5 K/uL   RBC 3.89 3.87 - 5.11 MIL/uL   Hemoglobin 11.8 (L) 12.0 - 15.0 g/dL   HCT 40.936.2 81.136.0 - 91.446.0 %   MCV 93.1 78.0 - 100.0 fL   MCH 30.3 26.0 - 34.0 pg   MCHC 32.6 30.0 - 36.0 g/dL   RDW 78.212.7 95.611.5 - 21.315.5 %   Platelets 225 150 - 400 K/uL   CLINICAL DATA:  Initial evaluation for heavy vaginal bleeding with lower abdominal cramping in early pregnancy.  EXAM: OBSTETRIC <14 WK US AND TRANSVAGINAL OB US  TECHNIQUE: Both transabdominal and transvaginal ultrasound examinations were performed for complete evaluation of the gestation as well as the maternal uterus, adnexal regions, and pelvic cul-de-sac. Transvaginal technique was performed to assess early pregnancy.  COMPARISON:  Comparison made with prior ultrasound from 01/20/2017.  FINDINGS: Intrauterine gestational sac: None  Yolk sac:  N/A  Embryo:  N/A  Cardiac Activity: N/A  Heart Rate: N/A   bpm  Subchorionic hemorrhage:  None visualized.  Maternal uterus/adnexae: Ovaries are normal in appearance bilaterally. No adnexal abnormality. Small volume free fluid within the pelvis.  IMPRESSION: 1. No intrauterine pregnancy now identified, consistent with interval spontaneous abortion. Correlation with serum beta HCGs recommended. 2. Small volume free fluid within the pelvis. 3. No other significant maternal uterine or adnexal abnormality.  MDM UA CBC US OB Transvaginal Ultrasound shows no IUP that was seen yesterday on ultrasound- consistent with spontaneous abortion Assessment and Plan  1. Spontaneous abortion in first trimester   2. Vaginal bleeding in pregnancy    -Discharge patient home in stable condition -Bleeding and pain precautions reviewed with patient. -Follow up in [facility] in 1 week for repeat bHCG and in 2 weeks for a visit with a provider -Encouraged to return here or to other Urgent Care/ED if she develops worsening of symptoms, increase in pain, fever, or other concerning symptoms.   Cleone Slim SNM 01/21/2017, 5:38 AM   I confirm that I have verified the information documented in the student midwife's note and that I have also personally performed the physical exam and all medical decision making activities.  Sharen Counter, CNM 8:48 PM

## 2017-01-21 NOTE — ED Triage Notes (Signed)
Came in with complaint of dizziness and generalized weakness and pain at 10/10. Pt. Stated that she had miscarriage this morning at 6am at [facility], that she was [redacted] weeks pregnant . Pt. Cannot recall if D and C was done . She was sent home after an ultrasound and was told that her uterus was cleared.  Pt. Reported of worsening bleeding soon as [facility] got [facility]

## 2017-01-21 NOTE — ED Provider Notes (Signed)
[facility]-EMERGENCY DEPT Provider Note   CSN: [id number] Arrival date & time: 01/21/17  1739     History   Chief Complaint Chief Complaint  Patient presents with  . Dizziness  . generalized pain post miscarriage    HPI Jodi Daniel is a 21 y.o. female who is G3P121 with history of spontaneous abortion that occurred this morning who presents with persistent abdominal pain and vaginal bleeding. Patient was seen at the [facility] earlier today for complaints of abdominal cramping and vaginal bleeding that began early this morning. Patient is currently [redacted] weeks pregnant at the time of the evaluations morning. At that time she had an ultrasound that revealed no intrauterine pregnancy and she was diagnosed with spontaneous abortion. Patient was discharged home with instructions to follow up in the women's clinic in 1 week for repeat beta hCG. Patient comes the emergency department today because she was having some worsening lower abdominal cramping. She states that she has not taken any medications for the pain. She denies any alleviating or aggravating factors. Additionally patient was having some vaginal bleeding was concerned that it was too much. Patient reports that she has gone through one fully saturated pads since being discharged this afternoon. She also reports some associated dizziness but states that she has not had any syncopal episodes denied any loss of conscious. Patient is able to tolerate fluids without any difficulty. Patient denies any chest pain, difficulty breathing, fever, dysuria, nausea/vomiting.  The history is provided by the patient.    Past Medical History:  Diagnosis Date  . Anxiety   . Asthma   . BV (bacterial vaginosis)   . Headache(784.0)   . Obese   . Panic   . PCOS (polycystic ovarian syndrome)   . UTI (lower urinary tract infection)     Patient Active Problem List   Diagnosis Date Noted  . Pregnancy, supervision of normal 02/25/2011    Past  Surgical History:  Procedure Laterality Date  . CESAREAN SECTION    . FRACTURE SURGERY     thumb  . HERNIA REPAIR      OB History    Gravida Para Term Preterm AB Living   3 1   1 1 1    SAB TAB Ectopic Multiple Live Births   1       1       Home Medications    Prior to Admission medications   Medication Sig Start Date End Date Taking? Authorizing Provider  albuterol (PROVENTIL HFA;VENTOLIN HFA) 108 (90 BASE) MCG/ACT inhaler Inhale 2 puffs into the lungs every 4 (four) hours as needed for wheezing or shortness of breath. 06/30/13  Yes Pollina, Canary Brim, MD  albuterol (PROVENTIL) (2.5 MG/3ML) 0.083% nebulizer solution Inhale 2.5 mg into the lungs every 4 (four) hours as needed for shortness of breath. 02/04/16  Yes [provider]  Prenatal Vit-Fe Fumarate-FA (MULTIVITAMIN-PRENATAL) 27-0.8 MG TABS tablet Take 1 tablet by mouth daily at 12 noon.   Yes [provider]  docusate sodium (COLACE) 100 MG capsule Take 1 capsule (100 mg total) by mouth 2 (two) times daily. Patient not taking: Reported on 01/21/2017 01/07/17 01/07/18  Lorne Skeens, MD  metroNIDAZOLE (FLAGYL) 500 MG tablet Take 1 tablet (500 mg total) by mouth 2 (two) times daily. Patient not taking: Reported on 01/21/2017 05/12/16   Barrett Henle, PA-C    Family History Family History  Problem Relation Age of Onset  . Diabetes Mother   . Migraines Mother   .  Hypertension Mother   . Heart murmur Brother   . Cancer Paternal Grandfather     Social History Social History  Substance Use Topics  . Smoking status: Former Smoker    Types: Cigars  . Smokeless tobacco: Never Used  . Alcohol use Yes     Comment: Occasional     Allergies   Patient has no known allergies.   Review of Systems Review of Systems  Constitutional: Negative for fever.  Respiratory: Negative for cough and shortness of breath.   Cardiovascular: Negative for chest pain.  Gastrointestinal: Positive  for abdominal pain. Negative for nausea and vomiting.  Genitourinary: Positive for vaginal bleeding. Negative for dysuria and hematuria.  Neurological: Positive for dizziness. Negative for headaches.     Physical Exam Updated Vital Signs BP 113/65   Pulse 73   Temp 97.8 F (36.6 C) (Oral)   Resp (!) 21   Ht 5\' 8"  (1.727 m)   Wt 81.6 kg (180 lb)   LMP 11/28/2016   SpO2 100%   Breastfeeding? Unknown   BMI 27.37 kg/m   Physical Exam  Constitutional: She is oriented to person, place, and time. She appears well-developed and well-nourished.  Appears unremarkable in no acute distress  HENT:  Head: Normocephalic and atraumatic.  Mouth/Throat: Oropharynx is clear and moist and mucous membranes are normal.  Eyes: Conjunctivae, EOM and lids are normal. Pupils are equal, round, and reactive to light.  Neck: Full passive range of motion without pain.  Cardiovascular: Normal rate, regular rhythm, normal heart sounds and normal pulses.  Exam reveals no gallop and no friction rub.   No murmur heard. Pulmonary/Chest: Effort normal and breath sounds normal.  Abdominal: Soft. Normal appearance and bowel sounds are normal. There is tenderness in the suprapubic area. There is no rigidity, no guarding and no CVA tenderness.  Abdomen is soft, nondistended. She has diffuse lower abdominal tenderness. McBurney's point tenderness. No CVA tenderness bilaterally. No peritoneal signs.  Musculoskeletal: Normal range of motion.  Neurological: She is alert and oriented to person, place, and time.  Skin: Skin is warm and dry. Capillary refill takes less than 2 seconds.  Psychiatric: She has a normal mood and affect. Her speech is normal.  Nursing note and vitals reviewed.    ED Treatments / Results  Labs (all labs ordered are listed, but only abnormal results are displayed) Labs Reviewed  BASIC METABOLIC PANEL - Abnormal; Notable for the following:       Result Value   CO2 20 (*)    Calcium 8.8 (*)     All other components within normal limits  CBC - Abnormal; Notable for the following:    Hemoglobin 11.8 (*)    HCT 35.0 (*)    All other components within normal limits  URINALYSIS, ROUTINE W REFLEX MICROSCOPIC - Abnormal; Notable for the following:    Hgb urine dipstick LARGE (*)    Bacteria, UA RARE (*)    Squamous Epithelial / LPF 0-5 (*)    All other components within normal limits    EKG  EKG Interpretation None       Radiology No results found.  Procedures Procedures (including critical care time)  Medications Ordered in ED Medications  ketorolac (TORADOL) 30 MG/ML injection 30 mg (30 mg Intramuscular Given 01/21/17 2041)     Initial Impression / Assessment and Plan / ED Course  I have reviewed the triage vital signs and the nursing notes.  Pertinent labs & imaging results that were  available during my care of the patient were reviewed by me and considered in my medical decision making (see chart for details).     21 year old female who had a spontaneous abortion this morning who presents with abdominal pain and vaginal bleeding. Patient is afebrile, non-toxic appearing, sitting comfortably on examination table. Vital signs reviewed and stable. Patient has only saturated 1 pad since being discharged earlier this afternoon. Symptoms likely result of continued spontaneous abortion, likely exacerbated by lack of medications for symptoms. . History/physical exam are not concerning for acute infectious etiology such as appendicitis or endometritis. Will check labs, including CBC, BMP, and UA. Analgesics provided in the department.   Labs and imaging reviewed. CBC shows Hgb/Hct at 11.8 and 35.0. Earlier today she had levels at 11.8 and 36.2. UA negative for any acute signs of infection. BMP unremarkable.   Discussed results with patients. She reports pain has improved since analgesics given in the department. Patient is tolerating fluids without any difficulty. Given that  patient had an US this morning and that symptoms are from persistent spontaneous abortion, no repeat imaging indicated at this time. [facility]. Encouraged her to follow-up as previously scheduled. Conservative therapies discussed. Strict return precautions discussed. Patient expresses understanding and agreement to plan.     Final Clinical Impressions(s) / ED Diagnoses   Final diagnoses:  Lower abdominal pain  Spontaneous abortion  Vaginal bleeding    New Prescriptions Discharge Medication List as of 01/21/2017  9:31 PM       Maxwell CaulLayden, Lindsey A, PA-C 01/23/17 2241    Lorre NickAllen, Anthony, MD 01/23/17 2334

## 2017-01-21 NOTE — MAU Note (Signed)
Pt. Here due to "heavy vaginal bleeding", with lower abd. Cramping.  Pt. [facility]Marland Kitchen.Marland Kitchen.Marland Kitchen.7weeks 4days pregnant. Bleeding started this morning around 0100. Pain 9/10.

## 2017-01-21 NOTE — Discharge Instructions (Signed)
As we discussed, is normal and have some cramping and vaginal bleeding after a spontaneous miscarriage. He needs to take Tylenol or ibuprofen as needed for the pain.  Continue to monitor the vaginal bleeding. If you start having worsening vaginal bleeding, [facility].  Drink plenty of fluids and make sure you are staying hydrated.   Return the emergency department [facility] for any worsening dental bleeding, abdominal pain, fever, difficulty breathing, loss of consciousness or any other worsening or concerning symptoms.

## 2017-01-21 NOTE — Discharge Instructions (Signed)
Miscarriage A miscarriage is the sudden loss of an unborn baby (fetus) before the 20th week of pregnancy. Most miscarriages happen in the first 3 months of pregnancy. Sometimes, it happens before a woman even knows she is pregnant. A miscarriage is also called a "spontaneous miscarriage" or "early pregnancy loss." Having a miscarriage can be an emotional experience. Talk with your caregiver about any questions you may have about miscarrying, the grieving process, and your future pregnancy plans. What are the causes?  Problems with the fetal chromosomes that make it impossible for the baby to develop normally. Problems with the baby's genes or chromosomes are most often the result of errors that occur, by chance, as the embryo divides and grows. The problems are not inherited from the parents.  Infection of the cervix or uterus.  Hormone problems.  Problems with the cervix, such as having an incompetent cervix. This is when the tissue in the cervix is not strong enough to hold the pregnancy.  Problems with the uterus, such as an abnormally shaped uterus, uterine fibroids, or congenital abnormalities.  Certain medical conditions.  Smoking, drinking alcohol, or taking illegal drugs.  Trauma. Often, the cause of a miscarriage is unknown. What are the signs or symptoms?  Vaginal bleeding or spotting, with or without cramps or pain.  Pain or cramping in the abdomen or lower back.  Passing fluid, tissue, or blood clots from the vagina. How is this diagnosed? Your caregiver will perform a physical exam. You may also have an ultrasound to confirm the miscarriage. Blood or urine tests may also be ordered. How is this treated?  Sometimes, treatment is not necessary if you naturally pass all the fetal tissue that was in the uterus. If some of the fetus or placenta remains in the body (incomplete miscarriage), tissue left behind may become infected and must be removed. Usually, a dilation and  curettage (D and C) procedure is performed. During a D and C procedure, the cervix is widened (dilated) and any remaining fetal or placental tissue is gently removed from the uterus.  Antibiotic medicines are prescribed if there is an infection. Other medicines may be given to reduce the size of the uterus (contract) if there is a lot of bleeding.  If you have Rh negative blood and your baby was Rh positive, you will need a Rh immunoglobulin shot. This shot will protect any future baby from having Rh blood problems in future pregnancies. Follow these instructions at home:  Your caregiver may order bed rest or may allow you to continue light activity. Resume activity as directed by your caregiver.  Have someone help with home and family responsibilities during this time.  Keep track of the number of sanitary pads you use each day and how soaked (saturated) they are. Write down this information.  Do not use tampons. Do not douche or have sexual intercourse until approved by your caregiver.  Only take over-the-counter or prescription medicines for pain or discomfort as directed by your caregiver.  Do not take aspirin. Aspirin can cause bleeding.  Keep all follow-up appointments with your caregiver.  If you or your partner have problems with grieving, talk to your caregiver or seek counseling to help cope with the pregnancy loss. Allow enough time to grieve before trying to get pregnant again. Get help right away if:  You have severe cramps or pain in your back or abdomen.  You have a fever.  You pass large blood clots (walnut-sized or larger) ortissue from your  vagina. Save any tissue for your caregiver to inspect. °· Your bleeding increases. °· You have a thick, bad-smelling vaginal discharge. °· You become lightheaded, weak, or you faint. °· You have chills. °This information is not intended to replace advice given to you by your health care provider. Make sure you discuss any questions  you have with your health care provider. °Document Released: 12/24/2000 Document Revised: 12/06/2015 Document Reviewed: 08/19/2011 °Elsevier Interactive Patient Education © 2017 Elsevier Inc. ° ° °Pelvic Rest °Pelvic rest may be recommended if: °· Your placenta is partially or completely covering the opening of your cervix (placenta previa). °· There is bleeding between the wall of the uterus and the amniotic sac in the first trimester of pregnancy (subchorionic hemorrhage). °· You went into labor too early (preterm labor). ° °Based on your overall health and the health of your baby, your health care provider will decide if pelvic rest is right for you. °How do I rest my pelvis? °For as long as told by your health care provider: °· Do not have sex, sexual stimulation, or an orgasm. °· Do not use tampons. Do not douche. Do not put anything in your vagina. °· Do not lift anything that is heavier than 10 lb (4.5 kg). °· Avoid activities that take a lot of effort (are strenuous). °· Avoid any activity in which your pelvic muscles could become strained. ° °When should I seek medical care? °Seek medical care if you have: °· Cramping pain in your lower abdomen. °· Vaginal discharge. °· A low, dull backache. °· Regular contractions. °· Uterine tightening. ° °When should I seek immediate medical care? °Seek immediate medical care if: °· You have vaginal bleeding and you are pregnant. ° °This information is not intended to replace advice given to you by your health care provider. Make sure you discuss any questions you have with your health care provider. °Document Released: 10/25/2010 Document Revised: 12/06/2015 Document Reviewed: 01/01/2015 °Elsevier Interactive Patient Education © 2018 Elsevier Inc. ° °

## 2017-01-22 LAB — GC/CHLAMYDIA PROBE AMP (~~LOC~~) NOT AT ARMC
Chlamydia: NEGATIVE
Neisseria Gonorrhea: NEGATIVE

## 2017-01-28 ENCOUNTER — Emergency Department (HOSPITAL_COMMUNITY)
Admission: EM | Admit: 2017-01-28 | Discharge: 2017-01-28 | Disposition: A | Discharge status: Home / Self Care | Payer: Medicaid Other | Attending: Emergency Medicine | Admitting: Emergency Medicine

## 2017-01-28 DIAGNOSIS — R451 Restlessness and agitation: Secondary | ICD-10-CM | POA: Insufficient documentation

## 2017-01-28 DIAGNOSIS — F17290 Nicotine dependence, other tobacco product, uncomplicated: Secondary | ICD-10-CM | POA: Diagnosis not present

## 2017-01-28 DIAGNOSIS — Z046 Encounter for general psychiatric examination, requested by authority: Secondary | ICD-10-CM | POA: Diagnosis not present

## 2017-01-28 DIAGNOSIS — F329 Major depressive disorder, single episode, unspecified: Secondary | ICD-10-CM | POA: Diagnosis present

## 2017-01-28 DIAGNOSIS — J45909 Unspecified asthma, uncomplicated: Secondary | ICD-10-CM | POA: Insufficient documentation

## 2017-01-28 DIAGNOSIS — Z630 Problems in relationship with spouse or partner: Secondary | ICD-10-CM | POA: Diagnosis not present

## 2017-01-28 DIAGNOSIS — F4321 Adjustment disorder with depressed mood: Secondary | ICD-10-CM | POA: Diagnosis not present

## 2017-01-28 LAB — CBC WITH DIFFERENTIAL/PLATELET
Basophils Absolute: 0 10*3/uL (ref 0.0–0.1)
Basophils Relative: 0 %
EOS ABS: 0 10*3/uL (ref 0.0–0.7)
EOS PCT: 0 %
HCT: 36.2 % (ref 36.0–46.0)
Hemoglobin: 12 g/dL (ref 12.0–15.0)
LYMPHS ABS: 1.7 10*3/uL (ref 0.7–4.0)
Lymphocytes Relative: 23 %
MCH: 30.8 pg (ref 26.0–34.0)
MCHC: 33.1 g/dL (ref 30.0–36.0)
MCV: 93.1 fL (ref 78.0–100.0)
Monocytes Absolute: 0.5 10*3/uL (ref 0.1–1.0)
Monocytes Relative: 7 %
Neutro Abs: 5 10*3/uL (ref 1.7–7.7)
Neutrophils Relative %: 70 %
PLATELETS: 241 10*3/uL (ref 150–400)
RBC: 3.89 MIL/uL (ref 3.87–5.11)
RDW: 12.9 % (ref 11.5–15.5)
WBC: 7.2 10*3/uL (ref 4.0–10.5)

## 2017-01-28 LAB — COMPREHENSIVE METABOLIC PANEL
ALK PHOS: 57 U/L (ref 38–126)
ALT: 19 U/L (ref 14–54)
AST: 23 U/L (ref 15–41)
Albumin: 4.1 g/dL (ref 3.5–5.0)
Anion gap: 7 (ref 5–15)
BILIRUBIN TOTAL: 0.5 mg/dL (ref 0.3–1.2)
BUN: 14 mg/dL (ref 6–20)
CALCIUM: 9.3 mg/dL (ref 8.9–10.3)
CO2: 23 mmol/L (ref 22–32)
CREATININE: 0.96 mg/dL (ref 0.44–1.00)
Chloride: 110 mmol/L (ref 101–111)
GFR calc Af Amer: >60 mL/min (ref >60)
Glucose, Bld: 86 mg/dL (ref 65–99)
Potassium: 3.7 mmol/L (ref 3.5–5.1)
Sodium: 140 mmol/L (ref 135–145)
TOTAL PROTEIN: 7.4 g/dL (ref 6.5–8.1)

## 2017-01-28 LAB — I-STAT BETA HCG BLOOD, ED (MC, WL, AP ONLY): HCG, QUANTITATIVE: 76.1 m[IU]/mL — AB (ref <5)

## 2017-01-28 LAB — RAPID URINE DRUG SCREEN, HOSP PERFORMED
AMPHETAMINES: NOT DETECTED
BENZODIAZEPINES: NOT DETECTED
Barbiturates: NOT DETECTED
Cocaine: NOT DETECTED
Opiates: NOT DETECTED
TETRAHYDROCANNABINOL: POSITIVE — AB

## 2017-01-28 LAB — ETHANOL

## 2017-01-28 MED ORDER — IBUPROFEN 600 MG PO TABS: 600.0000 mg | ORAL_TABLET | Freq: Four times a day (QID) | ORAL | 0 refills | Status: DC | PRN

## 2017-01-28 NOTE — ED Notes (Signed)
Bed: [id number] Expected date:  Expected time:  Means of arrival:  Comments: From [facility] with [facility]

## 2017-01-28 NOTE — ED Triage Notes (Signed)
[facility] a [facility] and [facility], but instead she reports [facility] wanted to keep her and she was afraid of being committed.  [facility] and that is why she was [facility].  Patient calm and cooperative now.  Denies SI, HI, or AVH.

## 2017-01-28 NOTE — Discharge Instructions (Signed)
[facility] see the [facility] in 1 week. You can actually try and set up an appointment - call the number.  Take motrin every 6 hours for the next 3 days. The bleeding is actually normal, as long as it is not getting severe. If the bleeding gets to the point where you are soaking a pad every hour - go to the Er.

## 2017-01-28 NOTE — Progress Notes (Signed)
Patient signed release of information for mother, Donivan ScullSchconda Strathman, and boyfriend, Victorio PalmMarcus Dalton.   Stacy GardnerErin Emiko Osorto, [facility] Emergency Room Clinical Social Worker [phone]
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
## 2017-02-15 ENCOUNTER — Inpatient Hospital Stay (HOSPITAL_COMMUNITY)
Admission: AD | Admit: 2017-02-15 | Discharge: 2017-02-15 | Disposition: A | Discharge status: Home / Self Care | Payer: Medicaid Other | Source: Ambulatory Visit | Attending: Obstetrics and Gynecology | Admitting: Obstetrics and Gynecology

## 2017-02-15 ENCOUNTER — Encounter (HOSPITAL_COMMUNITY): Payer: Self-pay

## 2017-02-15 DIAGNOSIS — Z3202 Encounter for pregnancy test, result negative: Secondary | ICD-10-CM | POA: Diagnosis not present

## 2017-02-15 DIAGNOSIS — R109 Unspecified abdominal pain: Secondary | ICD-10-CM

## 2017-02-15 DIAGNOSIS — Z7982 Long term (current) use of aspirin: Secondary | ICD-10-CM | POA: Diagnosis not present

## 2017-02-15 DIAGNOSIS — Z87891 Personal history of nicotine dependence: Secondary | ICD-10-CM | POA: Insufficient documentation

## 2017-02-15 DIAGNOSIS — N940 Mittelschmerz: Secondary | ICD-10-CM | POA: Diagnosis not present

## 2017-02-15 DIAGNOSIS — R1032 Left lower quadrant pain: Secondary | ICD-10-CM | POA: Diagnosis present

## 2017-02-15 LAB — POCT PREGNANCY, URINE: Preg Test, Ur: NEGATIVE

## 2017-02-15 LAB — URINALYSIS, ROUTINE W REFLEX MICROSCOPIC
BILIRUBIN URINE: NEGATIVE
Glucose, UA: NEGATIVE mg/dL
Hgb urine dipstick: NEGATIVE
KETONES UR: NEGATIVE mg/dL
Leukocytes, UA: NEGATIVE
NITRITE: NEGATIVE
Protein, ur: NEGATIVE mg/dL
SPECIFIC GRAVITY, URINE: 1.02 (ref 1.005–1.030)
pH: 6 (ref 5.0–8.0)

## 2017-02-15 LAB — HCG, QUANTITATIVE, PREGNANCY: HCG, BETA CHAIN, QUANT, S: 3 m[IU]/mL (ref <5)

## 2017-02-15 NOTE — MAU Provider Note (Signed)
History     CSN: [id number]  Arrival date and time: 02/15/17 [id number]  First Provider Initiated Contact with Patient 02/15/17 0122      Chief Complaint  Patient presents with  . Abdominal Pain   HPI Jodi Daniel is a 21 y.o. [phone] [id number] female who presents with abdominal pain. Patient had a 1st trimester miscarriage in July & did not keep her f/u appointment in the clinic. Reports mild intermittent LLQ pain that she describes as sharp. Pain started 3 days ago. Rates pain 2/10. Has not treated. Describes as mild pain that doesn't "really bother her" but since she missed her f/u appointment she thought she should come in tonight to check her pregnancy hormone level. Reports pink spotting on toilet paper when pain started 3 days ago but none since then.   OB History    Gravida Para Term Preterm AB Living   3 1   1 2 1    SAB TAB Ectopic Multiple Live Births   2       1      Past Medical History:  Diagnosis Date  . Anxiety   . Asthma   . BV (bacterial vaginosis)   . Headache(784.0)   . Obese   . Panic   . PCOS (polycystic ovarian syndrome)   . UTI (lower urinary tract infection)     Past Surgical History:  Procedure Laterality Date  . CESAREAN SECTION    . FRACTURE SURGERY     thumb  . HERNIA REPAIR      Family History  Problem Relation Age of Onset  . Diabetes Mother   . Migraines Mother   . Hypertension Mother   . Heart murmur Brother   . Cancer Paternal Grandfather     Social History  Substance Use Topics  . Smoking status: Former Smoker    Types: Cigars  . Smokeless tobacco: Never Used  . Alcohol use Yes     Comment: Occasional    Allergies: No Known Allergies  Prescriptions Prior to Admission  Medication Sig Dispense Refill Last Dose  . albuterol (PROVENTIL) (2.5 MG/3ML) 0.083% nebulizer solution Inhale 2.5 mg into the lungs every 4 (four) hours as needed for shortness of breath.   Past Week at Unknown time  . aspirin-acetaminophen-caffeine (EXCEDRIN  MIGRAINE) 250-250-65 MG tablet Take by mouth every 6 (six) hours as needed for headache.   02/14/2017 at Unknown time  . albuterol (PROVENTIL HFA;VENTOLIN HFA) 108 (90 BASE) MCG/ACT inhaler Inhale 2 puffs into the lungs every 4 (four) hours as needed for wheezing or shortness of breath. 1 Inhaler 2 Past Week at Unknown time  . docusate sodium (COLACE) 100 MG capsule Take 1 capsule (100 mg total) by mouth 2 (two) times daily. (Patient not taking: Reported on 01/21/2017) 60 capsule 2 Completed Course at Unknown time  . ibuprofen (ADVIL,MOTRIN) 600 MG tablet Take 1 tablet (600 mg total) by mouth every 6 (six) hours as needed. 30 tablet 0   . metroNIDAZOLE (FLAGYL) 500 MG tablet Take 1 tablet (500 mg total) by mouth 2 (two) times daily. (Patient not taking: Reported on 01/21/2017) 14 tablet 0 Completed Course at Unknown time    Review of Systems  Constitutional: Negative.   Gastrointestinal: Positive for abdominal pain. Negative for constipation, diarrhea, nausea and vomiting.  Genitourinary: Negative.    Physical Exam   Blood pressure (!) 115/57, pulse 72, temperature 98.6 F (37 C), temperature source Oral, resp. rate 18, height 5\' 8"  (1.727 m), weight 185 lb (  83.9 kg), SpO2 98 %.  Physical Exam  Nursing note and vitals reviewed. Constitutional: She is oriented to person, place, and time. She appears well-developed and well-nourished. No distress.  HENT:  Head: Normocephalic and atraumatic.  Eyes: Conjunctivae are normal. Right eye exhibits no discharge. Left eye exhibits no discharge. No scleral icterus.  Neck: Normal range of motion.  Cardiovascular: Normal rate, regular rhythm and normal heart sounds.   No murmur heard. Respiratory: Effort normal and breath sounds normal. No respiratory distress. She has no wheezes.  GI: Soft. Bowel sounds are normal. She exhibits no distension. There is no tenderness. There is no rebound and no guarding.  Neurological: She is alert and oriented to person,  place, and time.  Skin: Skin is warm and dry. She is not diaphoretic.  Psychiatric: She has a normal mood and affect. Her behavior is normal. Judgment and thought content normal.    MAU Course  Procedures Results for orders placed or performed during the hospital encounter of 02/15/17 (from the past 24 hour(s))  Urinalysis, Routine w reflex microscopic (not at [facility])     Status: None   Collection Time: 02/15/17  1:00 AM  Result Value Ref Range   Color, Urine YELLOW YELLOW   APPearance CLEAR CLEAR   Specific Gravity, Urine 1.020 1.005 - 1.030   pH 6.0 5.0 - 8.0   Glucose, UA NEGATIVE NEGATIVE mg/dL   Hgb urine dipstick NEGATIVE NEGATIVE   Bilirubin Urine NEGATIVE NEGATIVE   Ketones, ur NEGATIVE NEGATIVE mg/dL   Protein, ur NEGATIVE NEGATIVE mg/dL   Nitrite NEGATIVE NEGATIVE   Leukocytes, UA NEGATIVE NEGATIVE  Pregnancy, urine POC     Status: None   Collection Time: 02/15/17  1:08 AM  Result Value Ref Range   Preg Test, Ur NEGATIVE NEGATIVE  hCG, quantitative, pregnancy     Status: None   Collection Time: 02/15/17  1:35 AM  Result Value Ref Range   hCG, Beta Chain, Quant, S 3 <5 mIU/mL    MDM Negative UPT BHCG <5 VSS, NAD  Assessment and Plan  A: 1. Pregnancy examination or test, negative result   2. Mittelschmerz    P: Discharge home Discussed reasons to return to MAU F/u with PCP  Judeth HornErin Lehi Phifer 02/15/2017, 1:22 AM

## 2017-02-15 NOTE — Discharge Instructions (Signed)
Mittelschmerz  Mittelschmerz is pain in the belly (abdomen). The pain happens between periods. It can happen with a feeling of being sick to your stomach (nausea). It can also happen with a small amount of bleeding from your vagina.  Follow these instructions at home:  Pay attention to any changes in your condition. Take these actions to help with your pain:   Try soaking in a hot bath.   Take over-the-counter and prescription medicines only as told by your doctor.   Keep all follow-up visits as told by your doctor. This is important.    Contact a doctor if:   The pain is very bad most months.   The pain lasts longer than 24 hours.   Your pain medicine is not helping.   You have a fever.   You feel sick to your stomach, and the feeling does not go away.   You keep throwing up (vomiting).   You miss your period.   You have more than a small amount of blood coming from your vagina between periods.  This information is not intended to replace advice given to you by your health care provider. Make sure you discuss any questions you have with your health care provider.  Document Released: 08/07/2004 Document Revised: 12/06/2015 Document Reviewed: 09/25/2014  Elsevier Interactive Patient Education  2018 [employer]

## 2017-02-15 NOTE — MAU Note (Signed)
Low abd pain on left side for 3 days. Miscarriage on July 11th. No period since then. Hasn't take a pregnancy test. Was supposed to follow up in MAU 2 weeks ago for blood work but couldn't come.

## 2017-03-18 ENCOUNTER — Encounter: Payer: Self-pay | Admitting: Emergency Medicine

## 2017-03-18 ENCOUNTER — Emergency Department (HOSPITAL_COMMUNITY): Payer: Medicaid Other

## 2017-03-18 ENCOUNTER — Emergency Department (HOSPITAL_COMMUNITY)
Admission: EM | Admit: 2017-03-18 | Discharge: 2017-03-18 | Disposition: A | Discharge status: Home / Self Care | Payer: Medicaid Other | Attending: Emergency Medicine | Admitting: Emergency Medicine

## 2017-03-18 DIAGNOSIS — Y998 Other external cause status: Secondary | ICD-10-CM | POA: Insufficient documentation

## 2017-03-18 DIAGNOSIS — S61411A Laceration without foreign body of right hand, initial encounter: Secondary | ICD-10-CM | POA: Diagnosis not present

## 2017-03-18 DIAGNOSIS — Z79899 Other long term (current) drug therapy: Secondary | ICD-10-CM | POA: Diagnosis not present

## 2017-03-18 DIAGNOSIS — Y929 Unspecified place or not applicable: Secondary | ICD-10-CM | POA: Diagnosis not present

## 2017-03-18 DIAGNOSIS — J45909 Unspecified asthma, uncomplicated: Secondary | ICD-10-CM | POA: Insufficient documentation

## 2017-03-18 DIAGNOSIS — Y939 Activity, unspecified: Secondary | ICD-10-CM | POA: Diagnosis not present

## 2017-03-18 DIAGNOSIS — W2209XA Striking against other stationary object, initial encounter: Secondary | ICD-10-CM | POA: Diagnosis not present

## 2017-03-18 MED ORDER — LIDOCAINE HCL (PF) 1 % IJ SOLN
10.0000 mL | Freq: Once | INTRAMUSCULAR | Status: AC
  Administered 2017-03-18: 10 mL via INTRADERMAL
  Filled 2017-03-18: qty 10

## 2017-03-18 NOTE — Discharge Instructions (Signed)
Clean wound with soap and water. Bacitracin topically twice a day. Keep non stick gauze on the finger skin avulsion until healed. Keep covered. Follow up with family doctor as needed. Follow up for suture removal in 7 days.

## 2017-03-18 NOTE — ED Triage Notes (Signed)
Pt has x 2 lacs to R hand today after hitting glass, bleeding controlled, guaze placed in triage, pt able to wiggle fingers, pt has 1 cm lac to R pinky & 1 cm lac to inner, lateral hand, A&O x4
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
## 2017-04-04 ENCOUNTER — Inpatient Hospital Stay (HOSPITAL_COMMUNITY)
Admission: AD | Admit: 2017-04-04 | Discharge: 2017-04-05 | Disposition: A | Discharge status: Home / Self Care | Payer: Medicaid Other | Source: Ambulatory Visit | Attending: Obstetrics & Gynecology | Admitting: Obstetrics & Gynecology

## 2017-04-04 DIAGNOSIS — N3001 Acute cystitis with hematuria: Secondary | ICD-10-CM | POA: Diagnosis not present

## 2017-04-04 DIAGNOSIS — Z7982 Long term (current) use of aspirin: Secondary | ICD-10-CM | POA: Diagnosis not present

## 2017-04-04 DIAGNOSIS — Z87891 Personal history of nicotine dependence: Secondary | ICD-10-CM | POA: Diagnosis not present

## 2017-04-04 DIAGNOSIS — R300 Dysuria: Secondary | ICD-10-CM | POA: Diagnosis present

## 2017-04-04 NOTE — MAU Note (Signed)
Feel like I have uti. Hurts to pee and can barely pee. Nausea for a wk. Have not had a period in awhile.

## 2017-04-05 ENCOUNTER — Encounter (HOSPITAL_COMMUNITY): Payer: Self-pay | Admitting: *Deleted

## 2017-04-05 DIAGNOSIS — N3001 Acute cystitis with hematuria: Secondary | ICD-10-CM

## 2017-04-05 LAB — URINALYSIS, ROUTINE W REFLEX MICROSCOPIC
Bilirubin Urine: NEGATIVE
Glucose, UA: NEGATIVE mg/dL
KETONES UR: NEGATIVE mg/dL
Nitrite: POSITIVE — AB
Protein, ur: NEGATIVE mg/dL
Specific Gravity, Urine: 1.011 (ref 1.005–1.030)
pH: 5 (ref 5.0–8.0)

## 2017-04-05 LAB — POCT PREGNANCY, URINE: Preg Test, Ur: NEGATIVE

## 2017-04-05 MED ORDER — PHENAZOPYRIDINE HCL 200 MG PO TABS: 200.0000 mg | ORAL_TABLET | Freq: Three times a day (TID) | ORAL | 1 refills | Status: DC | PRN

## 2017-04-05 MED ORDER — PHENAZOPYRIDINE HCL 100 MG PO TABS
200.0000 mg | ORAL_TABLET | Freq: Once | ORAL | Status: AC
  Administered 2017-04-05: 200 mg via ORAL
  Filled 2017-04-05: qty 2

## 2017-04-05 MED ORDER — SULFAMETHOXAZOLE-TRIMETHOPRIM 800-160 MG PO TABS: 1.0000 | ORAL_TABLET | Freq: Two times a day (BID) | ORAL | 0 refills | Status: DC

## 2017-04-05 MED ORDER — SULFAMETHOXAZOLE-TRIMETHOPRIM 800-160 MG PO TABS
1.0000 | ORAL_TABLET | Freq: Once | ORAL | Status: AC
  Administered 2017-04-05: 1 via ORAL
  Filled 2017-04-05 (×2): qty 1

## 2017-04-05 NOTE — Discharge Instructions (Signed)

## 2017-04-05 NOTE — MAU Provider Note (Signed)
Chief Complaint:  Nausea and Urinary Tract Infection   First Provider Initiated Contact with Patient 04/05/17 0005       HPI: Jodi Daniel is a 21 y.o. [id number] who presents to maternity admissions reporting dysuria, frequency and difficulty voiding since this morning.  Felt fine yesterday.  No fever or chills.. States feels just like UTIs she has had in past She reports vaginal bleeding, vaginal itching/burning, urinary symptoms, h/a, dizziness, n/v, or fever/chills.    Urinary Tract Infection   This is a new problem. The current episode started yesterday. The problem occurs every urination. The problem has been unchanged. The quality of the pain is described as burning. The pain is moderate. Associated symptoms include frequency and nausea. Pertinent negatives include no chills, flank pain, hematuria or vomiting. She has tried nothing for the symptoms.    RN note: Feel like I have uti. Hurts to pee and can barely pee. Nausea for a wk. Have not had a period in awhile.   Past Medical History: Past Medical History:  Diagnosis Date  . Anxiety   . Asthma   . BV (bacterial vaginosis)   . Headache(784.0)   . Obese   . Panic   . PCOS (polycystic ovarian syndrome)   . UTI (lower urinary tract infection)     Past obstetric history: OB History  Gravida Para Term Preterm AB Living  SAB TAB Ectopic Multiple Live Births  2       1    # Outcome Date GA Lbr Len/2nd Weight Sex Delivery Anes PTL Lv  3 SAB 01/2017 [redacted]w[redacted]d         2 SAB 2016          1 Preterm 08/24/11    F CS-LTranv   LIV     Complications: Abruptio Placenta      Past Surgical History: Past Surgical History:  Procedure Laterality Date  . CESAREAN SECTION    . FRACTURE SURGERY     thumb  . HERNIA REPAIR      Family History: Family History  Problem Relation Age of Onset  . Diabetes Mother   . Migraines Mother   . Hypertension Mother   . Heart murmur Brother   . Cancer Paternal Grandfather      Social History: Social History  Substance Use Topics  . Smoking status: Former Smoker    Types: Cigars  . Smokeless tobacco: Never Used  . Alcohol use Yes     Comment: Occasional    Allergies: No Known Allergies  Meds:  Prescriptions Prior to Admission  Medication Sig Dispense Refill Last Dose  . albuterol (PROVENTIL HFA;VENTOLIN HFA) 108 (90 BASE) MCG/ACT inhaler Inhale 2 puffs into the lungs every 4 (four) hours as needed for wheezing or shortness of breath. 1 Inhaler 2 Past Week at Unknown time  . albuterol (PROVENTIL) (2.5 MG/3ML) 0.083% nebulizer solution Inhale 2.5 mg into the lungs every 4 (four) hours as needed for shortness of breath.   Past Week at Unknown time  . aspirin-acetaminophen-caffeine (EXCEDRIN MIGRAINE) 250-250-65 MG tablet Take by mouth every 6 (six) hours as needed for headache.   02/14/2017 at Unknown time    I have reviewed patient's Past Medical Hx, Surgical Hx, Family Hx, Social Hx, medications and allergies.  ROS:  Review of Systems  Constitutional: Negative for chills and fever.  Cardiovascular: Negative for leg swelling.  Gastrointestinal: Positive for abdominal pain and nausea. Negative for vomiting.  Genitourinary: Positive for difficulty urinating, dysuria and frequency. Negative for flank pain and hematuria.   Other systems negative     Physical Exam  Patient Vitals for the past 24 hrs:  BP Temp Pulse Resp Height Weight  04/04/17 2342 107/78 98 F (36.7 C) 86 20  (1.753 m) 183 lb (83 kg)   Constitutional: Well-developed, well-nourished female in no acute distress.  Cardiovascular: normal rate and rhythm, no ectopy audible, S1 & S2 heard, no murmur Respiratory: normal effort, no distress. Lungs CTAB with no wheezes or crackles GI: Abd soft, non-tender.  Nondistended.  No rebound, No guarding.  Bowel Sounds audible  MS: Extremities nontender, no edema, normal ROM Neurologic: Alert and oriented x 4.   Grossly nonfocal. GU: Neg CVAT.   Tender over bladder Skin:  Warm and Dry Psych:  Affect appropriate.  PELVIC EXAM: deferred   Labs:    Results for orders placed or performed during the hospital encounter of 04/04/17 (from the past 24 hour(s))  Urinalysis, Routine w reflex microscopic     Status: Abnormal   Collection Time: 04/04/17 11:47 PM  Result Value Ref Range   Color, Urine AMBER (A) YELLOW   APPearance HAZY (A) CLEAR   Specific Gravity, Urine 1.011 1.005 - 1.030   pH 5.0 5.0 - 8.0   Glucose, UA NEGATIVE NEGATIVE mg/dL   Hgb urine dipstick SMALL (A) NEGATIVE   Bilirubin Urine NEGATIVE NEGATIVE   Ketones, ur NEGATIVE NEGATIVE mg/dL   Protein, ur NEGATIVE NEGATIVE mg/dL   Nitrite POSITIVE (A) NEGATIVE   Leukocytes, UA TRACE (A) NEGATIVE   RBC / HPF 6-30 0 - 5 RBC/hpf   WBC, UA TOO NUMEROUS TO COUNT 0 - 5 WBC/hpf   Bacteria, UA RARE (A) NONE SEEN   Squamous Epithelial / LPF 6-30 (A) NONE SEEN   Mucus PRESENT   Pregnancy, urine POC     Status: None   Collection Time: 04/05/17 12:04 AM  Result Value Ref Range   Preg Test, Ur NEGATIVE NEGATIVE     Imaging:   MAU Course/MDM: I have ordered labs as follows: UA and urine culture Imaging ordered: none Results reviewed. Urine appears consistent with urinary tract infection   Treatments in MAU included Pyridium and first dose of Septra DS.   Pt stable at time of discharge.  Assessment: Acute cystitis with hematuria - Plan: Discharge patient   Plan: Discharge home Recommend push PO fluids Rx sent for Septra DS for UTI Rx Pyridium for dysuria   Encouraged to return here or to other Urgent Care/ED if she develops worsening of symptoms, increase in pain, fever, or other concerning symptoms.   Wynelle Bourgeois CNM, MSN Certified Nurse-Midwife 04/05/2017 12:05 AM

## 2017-04-05 NOTE — Progress Notes (Signed)
Jodi Daniel CNM in earlier to discuss test results and d/c plan. Written and verbal d/c instructions given and understanding voiced °

## 2017-04-06 LAB — URINE CULTURE

## 2017-04-19 ENCOUNTER — Emergency Department (HOSPITAL_COMMUNITY)
Admission: EM | Admit: 2017-04-19 | Discharge: 2017-04-19 | Disposition: A | Discharge status: Home / Self Care | Payer: Medicaid Other | Attending: Emergency Medicine | Admitting: Emergency Medicine

## 2017-04-19 ENCOUNTER — Encounter (HOSPITAL_COMMUNITY): Payer: Self-pay | Admitting: *Deleted

## 2017-04-19 DIAGNOSIS — Z79899 Other long term (current) drug therapy: Secondary | ICD-10-CM | POA: Diagnosis not present

## 2017-04-19 DIAGNOSIS — Z7982 Long term (current) use of aspirin: Secondary | ICD-10-CM | POA: Insufficient documentation

## 2017-04-19 DIAGNOSIS — Y92410 Unspecified street and highway as the place of occurrence of the external cause: Secondary | ICD-10-CM | POA: Diagnosis not present

## 2017-04-19 DIAGNOSIS — M62838 Other muscle spasm: Secondary | ICD-10-CM | POA: Insufficient documentation

## 2017-04-19 DIAGNOSIS — Y9389 Activity, other specified: Secondary | ICD-10-CM | POA: Diagnosis not present

## 2017-04-19 DIAGNOSIS — M542 Cervicalgia: Secondary | ICD-10-CM | POA: Insufficient documentation

## 2017-04-19 DIAGNOSIS — Y998 Other external cause status: Secondary | ICD-10-CM | POA: Diagnosis not present

## 2017-04-19 DIAGNOSIS — T148XXA Other injury of unspecified body region, initial encounter: Secondary | ICD-10-CM

## 2017-04-19 DIAGNOSIS — S9001XA Contusion of right ankle, initial encounter: Secondary | ICD-10-CM | POA: Diagnosis not present

## 2017-04-19 DIAGNOSIS — Z87891 Personal history of nicotine dependence: Secondary | ICD-10-CM | POA: Diagnosis not present

## 2017-04-19 DIAGNOSIS — M25571 Pain in right ankle and joints of right foot: Secondary | ICD-10-CM

## 2017-04-19 DIAGNOSIS — J45909 Unspecified asthma, uncomplicated: Secondary | ICD-10-CM | POA: Diagnosis not present

## 2017-04-19 DIAGNOSIS — S99911A Unspecified injury of right ankle, initial encounter: Secondary | ICD-10-CM | POA: Diagnosis present

## 2017-04-19 MED ORDER — KETOROLAC TROMETHAMINE 15 MG/ML IJ SOLN
30.0000 mg | Freq: Once | INTRAMUSCULAR | Status: AC
  Administered 2017-04-19: 30 mg via INTRAMUSCULAR
  Filled 2017-04-19: qty 2

## 2017-04-19 MED ORDER — CYCLOBENZAPRINE HCL 10 MG PO TABS: 10.0000 mg | ORAL_TABLET | Freq: Every day | ORAL | 0 refills | Status: AC

## 2017-04-19 NOTE — ED Notes (Signed)
Declined W/C at D/C and was escorted to lobby by RN. 
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
## 2017-04-19 NOTE — Discharge Instructions (Signed)
You may use over-the-counter Motrin (Ibuprofen), Aleve (Naproxen), Acetaminophen (Tylenol), topical muscle creams such as SalonPas, [employer], Bengay, etc. Please stretch, apply heat, and have massage therapy for additional assistance.

## 2017-07-14 ENCOUNTER — Other Ambulatory Visit: Payer: Self-pay

## 2017-07-14 ENCOUNTER — Inpatient Hospital Stay (HOSPITAL_COMMUNITY)
Admission: AD | Admit: 2017-07-14 | Discharge: 2017-07-14 | Disposition: A | Discharge status: Home / Self Care | Payer: Medicaid Other | Source: Ambulatory Visit | Attending: Family Medicine | Admitting: Family Medicine

## 2017-07-14 ENCOUNTER — Emergency Department (HOSPITAL_COMMUNITY)
Admission: EM | Admit: 2017-07-14 | Discharge: 2017-07-14 | Discharge status: Against Medical Advice | Payer: No Typology Code available for payment source

## 2017-07-14 ENCOUNTER — Encounter (HOSPITAL_COMMUNITY): Payer: Self-pay

## 2017-07-14 DIAGNOSIS — K5900 Constipation, unspecified: Secondary | ICD-10-CM

## 2017-07-14 DIAGNOSIS — N910 Primary amenorrhea: Secondary | ICD-10-CM | POA: Insufficient documentation

## 2017-07-14 DIAGNOSIS — S0291XA Unspecified fracture of skull, initial encounter for closed fracture: Secondary | ICD-10-CM

## 2017-07-14 DIAGNOSIS — B9689 Other specified bacterial agents as the cause of diseases classified elsewhere: Secondary | ICD-10-CM

## 2017-07-14 DIAGNOSIS — N760 Acute vaginitis: Secondary | ICD-10-CM | POA: Insufficient documentation

## 2017-07-14 DIAGNOSIS — N926 Irregular menstruation, unspecified: Secondary | ICD-10-CM

## 2017-07-14 HISTORY — DX: Unspecified fracture of skull, initial encounter for closed fracture: S02.91XA

## 2017-07-14 LAB — WET PREP, GENITAL
Sperm: NONE SEEN
TRICH WET PREP: NONE SEEN
Yeast Wet Prep HPF POC: NONE SEEN

## 2017-07-14 LAB — POCT PREGNANCY, URINE: PREG TEST UR: NEGATIVE

## 2017-07-14 MED ORDER — METRONIDAZOLE 500 MG PO TABS: 500.0000 mg | ORAL_TABLET | Freq: Two times a day (BID) | ORAL | 0 refills | Status: DC

## 2017-07-14 NOTE — Discharge Instructions (Signed)
Constipation, Adult Constipation is when a person:  Poops (has a bowel movement) fewer times in a week than normal.  Has a hard time pooping.  Has poop that is dry, hard, or bigger than normal.  Follow these instructions at home: Eating and drinking   Eat foods that have a lot of fiber, such as: ? Fresh fruits and vegetables. ? Whole grains. ? Beans.  Eat less of foods that are high in fat, low in fiber, or overly processed, such as: ? [place] fries. ? Hamburgers. ? Cookies. ? Candy. ? Soda.  Drink enough fluid to keep your pee (urine) clear or pale yellow. General instructions  Exercise regularly or as told by your doctor.  Go to the restroom when you feel like you need to poop. Do not hold it in.  Take over-the-counter and prescription medicines only as told by your doctor. These include any fiber supplements.  Do pelvic floor retraining exercises, such as: ? Doing deep breathing while relaxing your lower belly (abdomen). ? Relaxing your pelvic floor while pooping.  Watch your condition for any changes.  Keep all follow-up visits as told by your doctor. This is important. Contact a doctor if:  You have pain that gets worse.  You have a fever.  You have not pooped for 4 days.  You throw up (vomit).  You are not hungry.  You lose weight.  You are bleeding from the anus.  You have thin, pencil-like poop (stool). Get help right away if:  You have a fever, and your symptoms suddenly get worse.  You leak poop or have blood in your poop.  Your belly feels hard or bigger than normal (is bloated).  You have very bad belly pain.  You feel dizzy or you faint. This information is not intended to replace advice given to you by your health care provider. Make sure you discuss any questions you have with your health care provider. Document Released: 12/17/2007 Document Revised: 01/18/2016 Document Reviewed: 12/19/2015 Elsevier Interactive Patient Education   2018 [employer] Inc[employer]    Bacterial Vaginosis Bacterial vaginosis is an infection of the vagina. It happens when too many germs (bacteria) grow in the vagina. This infection puts you at risk for infections from sex (STIs). Treating this infection can lower your risk for some STIs. You should also treat this if you are pregnant. It can cause your baby to be born early. Follow these instructions at home: Medicines  Take over-the-counter and prescription medicines only as told by your doctor.  Take or use your antibiotic medicine as told by your doctor. Do not stop taking or using it even if you start to feel better. General instructions  If you your sexual partner is a woman, tell her that you have this infection. She needs to get treatment if she has symptoms. If you have a female partner, he does not need to be treated.  During treatment: ? Avoid sex. ? Do not douche. ? Avoid alcohol as told. ? Avoid breastfeeding as told.  Drink enough fluid to keep your pee (urine) clear or pale yellow.  Keep your vagina and butt (rectum) clean. ? Wash the area with warm water every day. ? Wipe from front to back after you use the toilet.  Keep all follow-up visits as told by your doctor. This is important. Preventing this condition  Do not douche.  Use only warm water to wash around your vagina.  Use protection when you have sex. This includes: ? Latex  condoms. ? Dental dams.  Limit how many people you have sex with. It is best to only have sex with the same person (be monogamous).  Get tested for STIs. Have your partner get tested.  Wear underwear that is cotton or lined with cotton.  Avoid tight pants and pantyhose. This is most important in summer.  Do not use any products that have nicotine or tobacco in them. These include cigarettes and e-cigarettes. If you need help quitting, ask your doctor.  Do not use illegal drugs.  Limit how much alcohol you drink. Contact a doctor  if:  Your symptoms do not get better, even after you are treated.  You have more discharge or pain when you pee (urinate).  You have a fever.  You have pain in your belly (abdomen).  You have pain with sex.  Your bleed from your vagina between periods. Summary  This infection happens when too many germs (bacteria) grow in the vagina.  Treating this condition can lower your risk for some infections from sex (STIs).  You should also treat this if you are pregnant. It can cause early (premature) birth.  Do not stop taking or using your antibiotic medicine even if you start to feel better. This information is not intended to replace advice given to you by your health care provider. Make sure you discuss any questions you have with your health care provider. Document Released: 04/08/2008 Document Revised: 03/15/2016 Document Reviewed: 03/15/2016 Elsevier Interactive Patient Education  2017 [employer]

## 2017-07-14 NOTE — MAU Note (Signed)
Pt states that she last had a bm on 07/12/2017 only after using a laxative and miralax. She states she is having abdominal pain intermittent 9/10.   She also believes that she has BV. She states she is having a watery brown discharge with fowl odor.   She states she also missed her last period and is unsure if she is pregnant. She currently is trying to get pregnant.

## 2017-07-14 NOTE — MAU Provider Note (Signed)
History     CSN: [id number]  Arrival date and time: 07/14/17 2059   None     Chief Complaint  Patient presents with  . Constipation   HPI  22 yo female G0 with  Normal menses with 37 day interval. She presents 2 weeks late for her period. Having some blackish discharge of past couple of days. Some mild nonradiating abdominal pain, mostly in lower regions. No palliating or provoking factors. + Constipation. Has been taking stool softeners without improvement.   She would like testing for BV and GC/Ct.  OB History    Gravida Para Term Preterm AB Living   3 1   1 2 1    SAB TAB Ectopic Multiple Live Births   2       1      Past Medical History:  Diagnosis Date  . Anxiety   . Asthma   . BV (bacterial vaginosis)   . Headache(784.0)   . Obese   . Panic   . PCOS (polycystic ovarian syndrome)   . UTI (lower urinary tract infection)     Past Surgical History:  Procedure Laterality Date  . CESAREAN SECTION    . FRACTURE SURGERY     thumb  . HERNIA REPAIR      Family History  Problem Relation Age of Onset  . Diabetes Mother   . Migraines Mother   . Hypertension Mother   . Heart murmur Brother   . Cancer Paternal Grandfather     Social History   Tobacco Use  . Smoking status: Former Smoker    Types: Cigars  . Smokeless tobacco: Never Used  Substance Use Topics  . Alcohol use: Yes    Comment: Occasional  . Drug use: No    Allergies: No Known Allergies  Medications Prior to Admission  Medication Sig Dispense Refill Last Dose  . albuterol (PROVENTIL HFA;VENTOLIN HFA) 108 (90 BASE) MCG/ACT inhaler Inhale 2 puffs into the lungs every 4 (four) hours as needed for wheezing or shortness of breath. 1 Inhaler 2 07/13/2017 at Unknown time  . albuterol (PROVENTIL) (2.5 MG/3ML) 0.083% nebulizer solution Inhale 2.5 mg into the lungs every 4 (four) hours as needed for shortness of breath.   07/13/2017 at Unknown time  . aspirin-acetaminophen-caffeine (EXCEDRIN MIGRAINE)  250-250-65 MG tablet Take by mouth every 6 (six) hours as needed for headache.   Past Week at Unknown time  . phenazopyridine (PYRIDIUM) 200 MG tablet Take 1 tablet (200 mg total) by mouth 3 (three) times daily as needed for pain (urethral spasm). 10 tablet 1   . sulfamethoxazole-trimethoprim (BACTRIM DS,SEPTRA DS) 800-160 MG tablet Take 1 tablet by mouth 2 (two) times daily. 14 tablet 0     Review of Systems Physical Exam   Blood pressure 132/71, pulse 76, temperature 97.7 F (36.5 C), temperature source Oral, resp. rate 18, height 5\' 8"  (1.727 m), weight 170 lb (77.1 kg), last menstrual period 05/17/2017, SpO2 100 %.  Physical Exam  Constitutional: She is oriented to person, place, and time. She appears well-developed and well-nourished.  HENT:  Head: Normocephalic and atraumatic.  Respiratory: Effort normal.  GI: Soft. She exhibits no mass. There is tenderness (lower abdominal discomfort). There is no rebound and no guarding. Hernia confirmed negative in the right inguinal area and confirmed negative in the left inguinal area.  Genitourinary: There is no rash, tenderness or lesion on the right labia. There is no rash, tenderness or lesion on the left labia. Uterus is not  deviated, not enlarged, not fixed and not tender. Cervix exhibits no motion tenderness, no discharge and no friability. Right adnexum displays no mass, no tenderness and no fullness. Left adnexum displays no mass, no tenderness and no fullness. No erythema, tenderness or bleeding in the vagina. No foreign body in the vagina. No signs of injury around the vagina. Vaginal discharge (brown discharge) found.  Lymphadenopathy:       Right: No inguinal adenopathy present.       Left: No inguinal adenopathy present.  Neurological: She is alert and oriented to person, place, and time.  Skin: Skin is warm and dry. No rash noted. No erythema. No pallor.  Psychiatric: She has a normal mood and affect. Her behavior is normal. Judgment  and thought content normal.    MAU Course   MDM   Assessment and Plan  1. Constipation Miralax BID until stools  2. Late period Possibly anovulatory. Will have her follow up in the office in 4-6 weeks. Contraception offered.  3. BV Flagyl 500mg  BID x7 days  Levie HeritageJacob J Mohid Furuya 07/14/2017, 9:33 PM

## 2017-07-15 LAB — CERVICOVAGINAL ANCILLARY ONLY
Chlamydia: NEGATIVE
NEISSERIA GONORRHEA: NEGATIVE

## 2017-08-06 ENCOUNTER — Other Ambulatory Visit: Payer: Self-pay

## 2017-08-06 ENCOUNTER — Encounter (HOSPITAL_COMMUNITY): Payer: Self-pay

## 2017-08-06 ENCOUNTER — Emergency Department (HOSPITAL_COMMUNITY)
Admission: EM | Admit: 2017-08-06 | Discharge: 2017-08-06 | Disposition: A | Discharge status: Home / Self Care | Payer: Medicaid Other | Attending: Emergency Medicine | Admitting: Emergency Medicine

## 2017-08-06 DIAGNOSIS — N760 Acute vaginitis: Secondary | ICD-10-CM | POA: Insufficient documentation

## 2017-08-06 DIAGNOSIS — B9689 Other specified bacterial agents as the cause of diseases classified elsewhere: Secondary | ICD-10-CM | POA: Insufficient documentation

## 2017-08-06 DIAGNOSIS — R300 Dysuria: Secondary | ICD-10-CM

## 2017-08-06 DIAGNOSIS — Z87891 Personal history of nicotine dependence: Secondary | ICD-10-CM | POA: Diagnosis not present

## 2017-08-06 DIAGNOSIS — N898 Other specified noninflammatory disorders of vagina: Secondary | ICD-10-CM

## 2017-08-06 LAB — URINALYSIS, ROUTINE W REFLEX MICROSCOPIC
BILIRUBIN URINE: NEGATIVE
GLUCOSE, UA: NEGATIVE mg/dL
HGB URINE DIPSTICK: NEGATIVE
Ketones, ur: NEGATIVE mg/dL
NITRITE: NEGATIVE
Protein, ur: NEGATIVE mg/dL
SPECIFIC GRAVITY, URINE: 1.012 (ref 1.005–1.030)
pH: 7 (ref 5.0–8.0)

## 2017-08-06 LAB — WET PREP, GENITAL
SPERM: NONE SEEN
TRICH WET PREP: NONE SEEN
YEAST WET PREP: NONE SEEN

## 2017-08-06 LAB — POC URINE PREG, ED: Preg Test, Ur: NEGATIVE

## 2017-08-06 MED ORDER — METRONIDAZOLE 500 MG PO TABS: 500.0000 mg | ORAL_TABLET | Freq: Two times a day (BID) | ORAL | 0 refills | Status: DC

## 2017-08-06 MED ORDER — AZITHROMYCIN 250 MG PO TABS
1000.0000 mg | ORAL_TABLET | Freq: Once | ORAL | Status: AC
  Administered 2017-08-06: 1000 mg via ORAL
  Filled 2017-08-06: qty 4

## 2017-08-06 MED ORDER — CEFTRIAXONE SODIUM 250 MG IJ SOLR
250.0000 mg | Freq: Once | INTRAMUSCULAR | Status: AC
  Administered 2017-08-06: 250 mg via INTRAMUSCULAR
  Filled 2017-08-06: qty 250

## 2017-08-06 MED ORDER — STERILE WATER FOR INJECTION IJ SOLN
INTRAMUSCULAR | Status: AC
  Filled 2017-08-06: qty 10

## 2017-08-06 MED ORDER — METRONIDAZOLE 500 MG PO TABS
500.0000 mg | ORAL_TABLET | Freq: Once | ORAL | Status: AC
  Administered 2017-08-06: 500 mg via ORAL
  Filled 2017-08-06: qty 1

## 2017-08-06 NOTE — ED Notes (Signed)
Discussed with patient pending results of gc/chlamydia and urine culture.

## 2017-08-06 NOTE — Discharge Instructions (Signed)
Medications: You has been treated empirically for gonorrhea and chlamydia today.  You will need to finish up a 7-day course of Flagyl for bacterial vaginosis.  Please do not drink alcohol while taking Flagyl, as it can make you feel sick.  Please continue to abstain from alcohol for 48 hours after finishing this antibiotic.  If you receive a call in 48 hours stating that any of your tests are positive, you have already been treated.  The health department is a resource for any sexually transmitted infection concerns.  I listed this on your paperwork.  Use a condom with every sexual encounter Follow up with your doctor and OBGYN in regards to today's visit.   Please return to the ER for worsening symptoms, high fevers or persistent vomiting.  You have been tested chlamydia and gonorrhea. These results will be available in approximately 3 days. You will be notified if they are positive.   It is very important to practice safe sex and use condoms when sexually active. If your results are positive you need to notify all sexual partners so they can be treated as well. The website https://garcia.net/http://www.dontspreadit.com/ can be used to send anonymous text messages or emails to alert sexual contacts.   SEEK IMMEDIATE MEDICAL CARE IF:  You develop an oral temperature above 102 F (38.9 C), not controlled by medications or lasting more than 2 days.  You develop an increase in pain.  You develop vaginal bleeding and it is not time for your period.  You develop painful intercourse.

## 2017-08-06 NOTE — ED Provider Notes (Signed)
[facility]-EMERGENCY DEPT Provider Note   CSN: [id number] Arrival date & time: 08/06/17  2440     History   Chief Complaint Chief Complaint  Patient presents with  . Dysuria  . Urinary Frequency  . Vaginal Discharge    HPI Jodi Daniel is a 22 y.o. female.  HPI  Patient is a 22 year old female with a history of asthma, PCO S, and anxiety presenting for vaginal itching for 2-3 days.  Patient reports that she is also noticed a liquid white vaginal discharge over this interval.  Patient reports vaginal irritation with sexual intercourse.  Patient denies dysuria or urgency.  Patient denies abdominal pain, flank pain, nausea, vomiting, fever, or chills.  Patient reports she has had one  female sexual partner in the last month.  Patient denies condom use.  Patient reports she recently had bacterial vaginosis, and reports that this feels different.  Past Medical History:  Diagnosis Date  . Anxiety   . Asthma   . BV (bacterial vaginosis)   . Headache(784.0)   . Obese   . Panic   . PCOS (polycystic ovarian syndrome)   . UTI (lower urinary tract infection)     Patient Active Problem List   Diagnosis Date Noted  . Pregnancy, supervision of normal 02/25/2011    Past Surgical History:  Procedure Laterality Date  . CESAREAN SECTION    . FRACTURE SURGERY     thumb  . HERNIA REPAIR      OB History    Gravida Para Term Preterm AB Living   3 1   1 2 1    SAB TAB Ectopic Multiple Live Births   2       1       Home Medications    Prior to Admission medications   Medication Sig Start Date End Date Taking? Authorizing Provider  albuterol (PROVENTIL HFA;VENTOLIN HFA) 108 (90 BASE) MCG/ACT inhaler Inhale 2 puffs into the lungs every 4 (four) hours as needed for wheezing or shortness of breath. 06/30/13   Gilda Crease, MD  albuterol (PROVENTIL) (2.5 MG/3ML) 0.083% nebulizer solution Inhale 2.5 mg into the lungs every 4 (four) hours as needed for  shortness of breath. 02/04/16   [provider]  metroNIDAZOLE (FLAGYL) 500 MG tablet Take 1 tablet (500 mg total) by mouth 2 (two) times daily. 07/14/17   Levie Heritage, DO    Family History Family History  Problem Relation Age of Onset  . Diabetes Mother   . Migraines Mother   . Hypertension Mother   . Heart murmur Brother   . Cancer Paternal Grandfather     Social History Social History   Tobacco Use  . Smoking status: Former Smoker    Types: Cigars  . Smokeless tobacco: Never Used  Substance Use Topics  . Alcohol use: Yes    Frequency: Never    Comment: Occasional  . Drug use: No     Allergies   Patient has no known allergies.   Review of Systems Review of Systems  Constitutional: Negative for chills and fever.  HENT: Negative for sore throat.   Gastrointestinal: Negative for abdominal pain, nausea and vomiting.  Genitourinary: Positive for vaginal discharge and vaginal pain. Negative for dysuria, flank pain, frequency, pelvic pain, urgency and vaginal bleeding.  Skin: Negative for rash.  All other systems reviewed and are negative.    Physical Exam Updated Vital Signs BP 122/75 (BP Location: Left Arm)   Pulse 86  Temp 98.3 F (36.8 C) (Oral)   Resp 18   Ht 5\' 8"  (1.727 m)   Wt 72.6 kg (160 lb)   LMP 07/23/2017   SpO2 100%   BMI 24.33 kg/m   Physical Exam  Constitutional: She appears well-developed and well-nourished. No distress.  HENT:  Head: Normocephalic and atraumatic.  Mouth/Throat: Oropharynx is clear and moist.  Eyes: Conjunctivae and EOM are normal. Pupils are equal, round, and reactive to light.  Neck: Normal range of motion. Neck supple.  Cardiovascular: Normal rate, regular rhythm, S1 normal and S2 normal.  No murmur heard. Pulmonary/Chest: Effort normal and breath sounds normal. She has no wheezes. She has no rales.  Abdominal: Soft. She exhibits no distension. There is no tenderness. There is no guarding.  Genitourinary:   Genitourinary Comments: Exam performed with nurse chaperone present.  There are no external lesions of the vulva or perineum.  No inguinal lymphadenopathy.  No lesions of the vaginal canal.  There is copious thin milky white discharge surrounding the cervix.  Cervix is nonfriable and nonerythematous.  On bimanual exam, there is no cervical motion tenderness, suprapubic tenderness, or bilateral adnexal tenderness.  Patient tolerated procedure well.  Musculoskeletal: Normal range of motion. She exhibits no edema or deformity.  Neurological: She is alert.  Patient moves extremities symmetrically and with good coordination. Cranial nerves grossly intact.  Skin: Skin is warm and dry. No rash noted. No erythema.  Psychiatric: She has a normal mood and affect. Her behavior is normal. Judgment and thought content normal.  Nursing note and vitals reviewed.    ED Treatments / Results  Labs (all labs ordered are listed, but only abnormal results are displayed) Labs Reviewed  WET PREP, GENITAL  URINALYSIS, ROUTINE W REFLEX MICROSCOPIC  POC URINE PREG, ED  GC/CHLAMYDIA PROBE AMP ([place]) NOT AT [facility]    EKG  EKG Interpretation None       Radiology No results found.  Procedures Procedures (including critical care time)  Medications Ordered in ED Medications - No data to display   Initial Impression / Assessment and Plan / ED Course  I have reviewed the triage vital signs and the nursing notes.  Pertinent labs & imaging results that were available during my care of the patient were reviewed by me and considered in my medical decision making (see chart for details).     Final Clinical Impressions(s) / ED Diagnoses   Final diagnoses:  Vaginal discharge  Dysuria  Bacterial vaginosis   Patient is nontoxic-appearing, afebrile, and in no acute distress.  Patient presenting for vaginal irritation and discharge.  Abdomen is nontender.  Do not suspect PID.  Urinalysis is  contaminated, but does not show signs of acute infection with few bacteria.  We will culture this and I discussed this with patient.  Patient with clue cells and many WBCs.  I engaged in shared decision making with patient regarding treatment for gonorrhea and chlamydia, and patient elected to treat today.  Patient instructed to abstain from unprotected intercourse until her results are back.  Patient given return precautions for any fevers, chills, abdominal pain, intractable nausea or vomiting, or worsening symptoms.  Patient is in understanding and agrees with the plan of care.  ED Discharge Orders    None       Delia ChimesMurray, Lynel Forester B, PA-C 08/06/17 1001    Alvira MondaySchlossman, Erin, MD 08/06/17 1941

## 2017-08-06 NOTE — ED Triage Notes (Signed)
Patient c/o dysuria, urinary frequency, and white vaginal drainage x 3 days.

## 2017-08-07 LAB — URINE CULTURE: Culture: NO GROWTH

## 2017-08-07 LAB — GC/CHLAMYDIA PROBE AMP (~~LOC~~) NOT AT ARMC
Chlamydia: NEGATIVE
NEISSERIA GONORRHEA: NEGATIVE

## 2017-09-03 ENCOUNTER — Ambulatory Visit: Payer: Medicaid Other | Admitting: Obstetrics & Gynecology

## 2017-09-26 ENCOUNTER — Encounter (HOSPITAL_COMMUNITY): Payer: Self-pay

## 2017-09-26 ENCOUNTER — Other Ambulatory Visit: Payer: Self-pay

## 2017-09-26 ENCOUNTER — Emergency Department (HOSPITAL_COMMUNITY): Payer: Medicaid Other

## 2017-09-26 ENCOUNTER — Emergency Department (HOSPITAL_COMMUNITY)
Admission: EM | Admit: 2017-09-26 | Discharge: 2017-09-26 | Disposition: A | Discharge status: Home / Self Care | Payer: Medicaid Other | Attending: Physician Assistant | Admitting: Physician Assistant

## 2017-09-26 DIAGNOSIS — J45909 Unspecified asthma, uncomplicated: Secondary | ICD-10-CM | POA: Diagnosis not present

## 2017-09-26 DIAGNOSIS — S0990XA Unspecified injury of head, initial encounter: Secondary | ICD-10-CM | POA: Diagnosis present

## 2017-09-26 DIAGNOSIS — Z87891 Personal history of nicotine dependence: Secondary | ICD-10-CM | POA: Insufficient documentation

## 2017-09-26 DIAGNOSIS — Y999 Unspecified external cause status: Secondary | ICD-10-CM | POA: Insufficient documentation

## 2017-09-26 DIAGNOSIS — Z79899 Other long term (current) drug therapy: Secondary | ICD-10-CM | POA: Insufficient documentation

## 2017-09-26 DIAGNOSIS — T148XXA Other injury of unspecified body region, initial encounter: Secondary | ICD-10-CM

## 2017-09-26 DIAGNOSIS — Y939 Activity, unspecified: Secondary | ICD-10-CM | POA: Insufficient documentation

## 2017-09-26 DIAGNOSIS — Y929 Unspecified place or not applicable: Secondary | ICD-10-CM | POA: Diagnosis not present

## 2017-09-26 LAB — POC URINE PREG, ED: Preg Test, Ur: NEGATIVE

## 2017-09-26 MED ORDER — NAPROXEN 500 MG PO TABS: 500.0000 mg | ORAL_TABLET | Freq: Two times a day (BID) | ORAL | 0 refills | Status: DC

## 2017-09-26 MED ORDER — IPRATROPIUM-ALBUTEROL 0.5-2.5 (3) MG/3ML IN SOLN
3.0000 mL | Freq: Once | RESPIRATORY_TRACT | Status: AC
  Administered 2017-09-26: 3 mL via RESPIRATORY_TRACT
  Filled 2017-09-26: qty 3

## 2017-09-26 MED ORDER — METHOCARBAMOL 500 MG PO TABS: 500.0000 mg | ORAL_TABLET | Freq: Every evening | ORAL | 0 refills | Status: DC | PRN

## 2017-09-26 MED ORDER — IBUPROFEN 800 MG PO TABS
800.0000 mg | ORAL_TABLET | Freq: Once | ORAL | Status: AC
  Administered 2017-09-26: 800 mg via ORAL
  Filled 2017-09-26: qty 1

## 2017-09-26 NOTE — ED Triage Notes (Signed)
PT refused Vitals when Pt walked out.

## 2017-09-26 NOTE — Discharge Instructions (Signed)
As discussed, you may experience muscle spasm and pain in your neck and back in the following days and the medicine prescribed can help with muscle spasm but cannot be taken if driving, with alcohol or operating machinery.   Follow up with your Primary care provider if symptoms  persist beyond a week.  Return if worsening or new concerning symptoms in the meantime.
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
## 2017-09-26 NOTE — ED Notes (Signed)
PT left with out DC papers . Unable to check vitals because PT walked out the door with family.
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
## 2018-01-25 ENCOUNTER — Other Ambulatory Visit (HOSPITAL_COMMUNITY)
Admission: RE | Admit: 2018-01-25 | Discharge: 2018-01-25 | Disposition: A | Discharge status: Home / Self Care | Payer: Medicaid Other | Source: Ambulatory Visit | Attending: Medical | Admitting: Medical

## 2018-01-25 ENCOUNTER — Encounter: Payer: Self-pay | Admitting: Medical

## 2018-01-25 ENCOUNTER — Ambulatory Visit (INDEPENDENT_AMBULATORY_CARE_PROVIDER_SITE_OTHER): Payer: Medicaid Other | Admitting: Medical

## 2018-01-25 ENCOUNTER — Encounter

## 2018-01-25 VITALS — BP 117/59 | HR 71 | Wt 167.5 lb

## 2018-01-25 DIAGNOSIS — Z0000 Encounter for general adult medical examination without abnormal findings: Secondary | ICD-10-CM | POA: Diagnosis not present

## 2018-01-25 DIAGNOSIS — Z3202 Encounter for pregnancy test, result negative: Secondary | ICD-10-CM | POA: Diagnosis not present

## 2018-01-25 DIAGNOSIS — Z01419 Encounter for gynecological examination (general) (routine) without abnormal findings: Secondary | ICD-10-CM

## 2018-01-25 DIAGNOSIS — E282 Polycystic ovarian syndrome: Secondary | ICD-10-CM

## 2018-01-25 DIAGNOSIS — N760 Acute vaginitis: Secondary | ICD-10-CM

## 2018-01-25 DIAGNOSIS — B9689 Other specified bacterial agents as the cause of diseases classified elsewhere: Secondary | ICD-10-CM

## 2018-01-25 DIAGNOSIS — Z3200 Encounter for pregnancy test, result unknown: Secondary | ICD-10-CM

## 2018-01-25 LAB — POCT PREGNANCY, URINE: Preg Test, Ur: NEGATIVE

## 2018-01-25 NOTE — Progress Notes (Signed)
  Subjective:    Jodi Daniel is a 22 y.o. female who presents for an annual exam. The patient has no complaints today. The patient is sexually active. GYN screening history: last pap: approximate date ~ 2016 and was normal. The patient wears seatbelts: yes. The patient participates in regular exercise: not asked. Has the patient ever been transfused or tattooed?: not asked. The patient reports that there is not domestic violence in her life. Patient is not currently on birth control. She has been diagnosed with PCOS in the past, but is unsure if this is accurate. She states mild irregularities in her periods. She does not want birth control and does desire pregnancy.   Menstrual History: OB History    Gravida  3   Para  1   Term      Preterm  1   AB  2   Living  1     SAB  2   TAB      Ectopic      Multiple      Live Births  1           Menarche age: 22 years old  No LMP recorded.12/21/17    The following portions of the patient's history were reviewed and updated as appropriate: allergies, current medications, past family history, past medical history, past social history, past surgical history and problem list. Review of Systems Pertinent items are noted in HPI.      Objective:   Physical Exam  Nursing note and vitals reviewed. Constitutional: She is oriented to person, place, and time. She appears well-developed and well-nourished. No distress.  HENT:  Head: Normocephalic and atraumatic.  Eyes: EOM are normal.  Neck: Neck supple. No thyromegaly present.  Cardiovascular: Normal rate, regular rhythm and normal heart sounds.  No murmur heard. Respiratory: Effort normal and breath sounds normal. No respiratory distress. She has no wheezes.  GI: Soft. Bowel sounds are normal. She exhibits no distension and no mass. There is no tenderness. There is no rebound and no guarding.  Genitourinary: Uterus is not enlarged and not tender. Cervix exhibits no motion  tenderness, no discharge and no friability. Right adnexum displays no mass and no tenderness. Left adnexum displays no mass and no tenderness. No bleeding in the vagina. Vaginal discharge (small, white) found.  Neurological: She is alert and oriented to person, place, and time.  Skin: Skin is warm and dry. No erythema.  Psychiatric: She has a normal mood and affect.     Results for orders placed or performed in visit on 01/25/18 (from the past 24 hour(s))  Pregnancy, urine POC     Status: None   Collection Time: 01/25/18  5:15 PM  Result Value Ref Range   Preg Test, Ur NEGATIVE NEGATIVE   Patient declines STD screening as recently performed in the ED.   Assessment:    Healthy female exam.   Desired fertility, possible PCOS Negative pregnancy test  Plan:     Await pap smear results.   Patient will be contacted with any abnormal results [facility]([place] for infertility consultation  Patient to return to [facility] for routine GYN screenings or sooner PRN  Jodi Daniel, Jodi Bridgett N, PA-C 01/25/2018 5:37 PM

## 2018-01-25 NOTE — Patient Instructions (Signed)

## 2018-01-27 LAB — CYTOLOGY - PAP
ADEQUACY: ABSENT
Bacterial vaginitis: POSITIVE — AB
Diagnosis: NEGATIVE

## 2018-02-01 MED ORDER — METRONIDAZOLE 500 MG PO TABS: 500.0000 mg | ORAL_TABLET | Freq: Two times a day (BID) | ORAL | 0 refills | Status: DC

## 2018-02-01 NOTE — Addendum Note (Signed)
Addended by: Marny LowensteinWENZEL, JULIE N on: 02/01/2018 03:00 PM   Modules accepted: Orders

## 2018-02-24 ENCOUNTER — Encounter (HOSPITAL_COMMUNITY): Payer: Self-pay | Admitting: Family Medicine

## 2018-02-24 ENCOUNTER — Emergency Department (HOSPITAL_COMMUNITY)
Admission: EM | Admit: 2018-02-24 | Discharge: 2018-02-24 | Disposition: A | Discharge status: Home / Self Care | Payer: Medicaid Other | Source: Home / Self Care | Attending: Emergency Medicine | Admitting: Emergency Medicine

## 2018-02-24 ENCOUNTER — Ambulatory Visit (HOSPITAL_COMMUNITY)
Admission: EM | Admit: 2018-02-24 | Discharge: 2018-02-24 | Disposition: A | Discharge status: Home / Self Care | Payer: Medicaid Other | Attending: Family Medicine | Admitting: Family Medicine

## 2018-02-24 ENCOUNTER — Emergency Department (HOSPITAL_COMMUNITY)
Admission: EM | Admit: 2018-02-24 | Discharge: 2018-02-24 | Disposition: A | Discharge status: Home / Self Care | Payer: Medicaid Other | Attending: Emergency Medicine | Admitting: Emergency Medicine

## 2018-02-24 ENCOUNTER — Other Ambulatory Visit: Payer: Self-pay

## 2018-02-24 ENCOUNTER — Encounter (HOSPITAL_COMMUNITY): Payer: Self-pay

## 2018-02-24 ENCOUNTER — Emergency Department (HOSPITAL_COMMUNITY): Payer: Medicaid Other

## 2018-02-24 DIAGNOSIS — Y999 Unspecified external cause status: Secondary | ICD-10-CM | POA: Insufficient documentation

## 2018-02-24 DIAGNOSIS — S0181XA Laceration without foreign body of other part of head, initial encounter: Secondary | ICD-10-CM | POA: Insufficient documentation

## 2018-02-24 DIAGNOSIS — S0291XA Unspecified fracture of skull, initial encounter for closed fracture: Secondary | ICD-10-CM

## 2018-02-24 DIAGNOSIS — Y92002 Bathroom of unspecified non-institutional (private) residence single-family (private) house as the place of occurrence of the external cause: Secondary | ICD-10-CM

## 2018-02-24 DIAGNOSIS — S0990XA Unspecified injury of head, initial encounter: Secondary | ICD-10-CM

## 2018-02-24 DIAGNOSIS — Z87891 Personal history of nicotine dependence: Secondary | ICD-10-CM | POA: Insufficient documentation

## 2018-02-24 DIAGNOSIS — W010XXA Fall on same level from slipping, tripping and stumbling without subsequent striking against object, initial encounter: Secondary | ICD-10-CM | POA: Insufficient documentation

## 2018-02-24 DIAGNOSIS — Z5321 Procedure and treatment not carried out due to patient leaving prior to being seen by health care provider: Secondary | ICD-10-CM | POA: Diagnosis not present

## 2018-02-24 DIAGNOSIS — S0121XA Laceration without foreign body of nose, initial encounter: Secondary | ICD-10-CM | POA: Diagnosis not present

## 2018-02-24 DIAGNOSIS — R55 Syncope and collapse: Secondary | ICD-10-CM | POA: Insufficient documentation

## 2018-02-24 DIAGNOSIS — Y93E1 Activity, personal bathing and showering: Secondary | ICD-10-CM

## 2018-02-24 DIAGNOSIS — S022XXA Fracture of nasal bones, initial encounter for closed fracture: Secondary | ICD-10-CM | POA: Insufficient documentation

## 2018-02-24 DIAGNOSIS — S0101XA Laceration without foreign body of scalp, initial encounter: Secondary | ICD-10-CM

## 2018-02-24 DIAGNOSIS — Z23 Encounter for immunization: Secondary | ICD-10-CM

## 2018-02-24 DIAGNOSIS — Y929 Unspecified place or not applicable: Secondary | ICD-10-CM | POA: Insufficient documentation

## 2018-02-24 DIAGNOSIS — S0093XA Contusion of unspecified part of head, initial encounter: Secondary | ICD-10-CM | POA: Insufficient documentation

## 2018-02-24 DIAGNOSIS — R9431 Abnormal electrocardiogram [ECG] [EKG]: Secondary | ICD-10-CM

## 2018-02-24 DIAGNOSIS — H02846 Edema of left eye, unspecified eyelid: Secondary | ICD-10-CM

## 2018-02-24 DIAGNOSIS — Y939 Activity, unspecified: Secondary | ICD-10-CM | POA: Insufficient documentation

## 2018-02-24 DIAGNOSIS — J45909 Unspecified asthma, uncomplicated: Secondary | ICD-10-CM

## 2018-02-24 LAB — BASIC METABOLIC PANEL
Anion gap: 8 (ref 5–15)
BUN: 9 mg/dL (ref 6–20)
CO2: 21 mmol/L — AB (ref 22–32)
CREATININE: 0.88 mg/dL (ref 0.44–1.00)
Calcium: 9.2 mg/dL (ref 8.9–10.3)
Chloride: 111 mmol/L (ref 98–111)
GFR calc Af Amer: >60 mL/min (ref >60)
GFR calc non Af Amer: >60 mL/min (ref >60)
GLUCOSE: 85 mg/dL (ref 70–99)
Potassium: 3.4 mmol/L — ABNORMAL LOW (ref 3.5–5.1)
SODIUM: 140 mmol/L (ref 135–145)

## 2018-02-24 LAB — CBC
HCT: 38.6 % (ref 36.0–46.0)
HEMOGLOBIN: 12.3 g/dL (ref 12.0–15.0)
MCH: 30.7 pg (ref 26.0–34.0)
MCHC: 31.9 g/dL (ref 30.0–36.0)
MCV: 96.3 fL (ref 78.0–100.0)
Platelets: 237 10*3/uL (ref 150–400)
RBC: 4.01 MIL/uL (ref 3.87–5.11)
RDW: 11.9 % (ref 11.5–15.5)
WBC: 10.9 10*3/uL — ABNORMAL HIGH (ref 4.0–10.5)

## 2018-02-24 MED ORDER — CEPHALEXIN 500 MG PO CAPS: 500.0000 mg | ORAL_CAPSULE | Freq: Two times a day (BID) | ORAL | 0 refills | Status: AC

## 2018-02-24 MED ORDER — LIDOCAINE HCL (PF) 1 % IJ SOLN
5.0000 mL | Freq: Once | INTRAMUSCULAR | Status: AC
  Administered 2018-02-24: 5 mL
  Filled 2018-02-24: qty 5

## 2018-02-24 MED ORDER — TETANUS-DIPHTH-ACELL PERTUSSIS 5-2.5-18.5 LF-MCG/0.5 IM SUSP
0.5000 mL | Freq: Once | INTRAMUSCULAR | Status: AC
  Administered 2018-02-24: 0.5 mL via INTRAMUSCULAR
  Filled 2018-02-24: qty 0.5

## 2018-02-24 MED ORDER — HYDROCODONE-ACETAMINOPHEN 5-325 MG PO TABS
ORAL_TABLET | ORAL | Status: AC
  Filled 2018-02-24: qty 1

## 2018-02-24 MED ORDER — HYDROCODONE-ACETAMINOPHEN 5-325 MG PO TABS
1.0000 | ORAL_TABLET | Freq: Once | ORAL | Status: AC
  Administered 2018-02-24: 1 via ORAL

## 2018-02-24 MED ORDER — OXYCODONE-ACETAMINOPHEN 5-325 MG PO TABS: 1.0000 | ORAL_TABLET | Freq: Four times a day (QID) | ORAL | 0 refills | Status: DC | PRN
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
## 2018-02-24 NOTE — ED Notes (Signed)
Patient states "I'm going to a different hospital, because yall not doing shit for me here". Patient walked out of triage out into lobby.
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
## 2018-02-24 NOTE — ED Notes (Signed)
Pt and friend informed that the friend needs to drive since the pt is getting a narcotic in our facility.  Both stated understanding.

## 2018-02-24 NOTE — ED Notes (Signed)
Pt walked out of room and said that she was leaving. Pt stated that she is going to another hospital. Pt refused to stay.

## 2018-02-24 NOTE — ED Notes (Signed)
Ice pack up to head, patient asked to stay, refused.
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
## 2018-02-24 NOTE — ED Provider Notes (Signed)
Patient placed in Quick Look pathway, seen and evaluated   Chief Complaint: fall  HPI:  Jodi Daniel is a 22 y.o. female who presents to the ED s/p fall that occurred this morning about 11. Patient reports she was getting ready to get into the shower and the next thing she remembered was waking up and having a headache and bleeding from the face. Patient not sure what caused the fall. She denies any previous syncopal episodes. Patient c/o severe face and head pain and nasal pain. Patient was taken to Urgent Care by a family member and then told to come to the ED.   ROS: Skin: laceration to face  Neuro: headache   Physical Exam:  BP 126/88 (BP Location: Right Arm)   Pulse 70   Temp 98.3 F (36.8 C) (Oral)   Resp 16   Ht 5\' 8"  (1.727 m)   Wt 72.6 kg   LMP 01/30/2018   SpO2 100%   BMI 24.33 kg/m     Gen: No distress  Neuro: Awake and Alert  Skin: laceration left side of nose near left eye  Head; contusion and abrasion left scalp  Eyes: good occular movement, no entrapment, no nystagmus  Initiation of care has begun. The patient has been counseled on the process, plan, and necessity for staying for the completion/evaluation, and the remainder of the medical screening examination    Janne Napoleoneese, Hope M, NP 02/24/18 1603    Gerhard MunchLockwood, Robert, MD 02/28/18 [phone]
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
## 2018-02-24 NOTE — Discharge Instructions (Addendum)
Please go to the ER for further evaluation °

## 2018-02-24 NOTE — ED Triage Notes (Signed)
Pt fell and slipped in the bathtub. Pt hit her head and forehead. C/o laceration to her head and forehead. This happened today.

## 2018-03-01 ENCOUNTER — Encounter (HOSPITAL_COMMUNITY): Payer: Self-pay | Admitting: *Deleted

## 2018-03-01 ENCOUNTER — Emergency Department (HOSPITAL_COMMUNITY)
Admission: EM | Admit: 2018-03-01 | Discharge: 2018-03-01 | Disposition: A | Discharge status: Home / Self Care | Payer: Medicaid Other | Attending: Emergency Medicine | Admitting: Emergency Medicine

## 2018-03-01 ENCOUNTER — Other Ambulatory Visit: Payer: Self-pay

## 2018-03-01 DIAGNOSIS — Z79899 Other long term (current) drug therapy: Secondary | ICD-10-CM | POA: Diagnosis not present

## 2018-03-01 DIAGNOSIS — G44309 Post-traumatic headache, unspecified, not intractable: Secondary | ICD-10-CM | POA: Insufficient documentation

## 2018-03-01 DIAGNOSIS — F0781 Postconcussional syndrome: Secondary | ICD-10-CM | POA: Insufficient documentation

## 2018-03-01 DIAGNOSIS — Z87891 Personal history of nicotine dependence: Secondary | ICD-10-CM | POA: Diagnosis not present

## 2018-03-01 DIAGNOSIS — R51 Headache: Secondary | ICD-10-CM | POA: Diagnosis present

## 2018-03-01 DIAGNOSIS — J45909 Unspecified asthma, uncomplicated: Secondary | ICD-10-CM | POA: Diagnosis not present

## 2018-03-01 MED ORDER — KETOROLAC TROMETHAMINE 60 MG/2ML IM SOLN
60.0000 mg | Freq: Once | INTRAMUSCULAR | Status: AC
  Administered 2018-03-01: 60 mg via INTRAMUSCULAR
  Filled 2018-03-01: qty 2

## 2018-03-01 MED ORDER — METOCLOPRAMIDE HCL 5 MG/ML IJ SOLN
10.0000 mg | Freq: Once | INTRAMUSCULAR | Status: AC
  Administered 2018-03-01: 10 mg via INTRAMUSCULAR
  Filled 2018-03-01: qty 2

## 2018-03-01 MED ORDER — DIPHENHYDRAMINE HCL 50 MG/ML IJ SOLN
25.0000 mg | Freq: Once | INTRAMUSCULAR | Status: AC
  Administered 2018-03-01: 25 mg via INTRAMUSCULAR
  Filled 2018-03-01: qty 1

## 2018-03-01 MED ORDER — ONDANSETRON 4 MG PO TBDP
4.0000 mg | ORAL_TABLET | Freq: Once | ORAL | Status: AC | PRN
  Administered 2018-03-01: 4 mg via ORAL
  Filled 2018-03-01: qty 1

## 2018-03-01 MED ORDER — BUTALBITAL-APAP-CAFFEINE 50-325-40 MG PO TABS: 1.0000 | ORAL_TABLET | Freq: Four times a day (QID) | ORAL | 0 refills | Status: DC | PRN

## 2018-03-01 NOTE — ED Triage Notes (Signed)
Pt reports h/a which she's had since the 14th of this month.  Reports hx of concussion d/t fall.  She reports n/v with ringing in her ears.  She has photosensitivity and sensitive to noise.  [facility].  She is A&Ox 4.  In NAD>

## 2018-03-01 NOTE — ED Provider Notes (Signed)
[facility]-EMERGENCY DEPT Provider Note   CSN: [id number] Arrival date & time: 03/01/18  1730     History   Chief Complaint Chief Complaint  Patient presents with  . Headache    HPI Jodi Pernaleyah Daniel is a 22 y.o. female.  22 year old female presents with persistent headache after sustaining a head injury several days ago.  Has been seen for this x2 with 2- head CTs most recently 2 days ago.  Endorses photophobia as well as phonophobia.  Has had nausea with occasional nonbilious emesis.  No neck pain or fever.  No new weakness noted.  Diagnosed with postconcussive syndrome and has been referred to neurology.  Has been using her home medications without relief     Past Medical History:  Diagnosis Date  . Anxiety   . Asthma   . BV (bacterial vaginosis)   . Headache(784.0)   . Obese   . Panic   . PCOS (polycystic ovarian syndrome)   . UTI (lower urinary tract infection)     There are no active problems to display for this patient.   Past Surgical History:  Procedure Laterality Date  . CESAREAN SECTION    . FRACTURE SURGERY     thumb  . HERNIA REPAIR       OB History    Gravida  3   Para  1   Term      Preterm  1   AB  2   Living  1     SAB  2   TAB      Ectopic      Multiple      Live Births  1            Home Medications    Prior to Admission medications   Medication Sig Start Date End Date Taking? Authorizing Provider  albuterol (PROVENTIL HFA;VENTOLIN HFA) 108 (90 BASE) MCG/ACT inhaler Inhale 2 puffs into the lungs every 4 (four) hours as needed for wheezing or shortness of breath. 06/30/13   Gilda CreasePollina, Christopher J, MD  cephALEXin (KEFLEX) 500 MG capsule Take 1 capsule (500 mg total) by mouth 2 (two) times daily for 7 days. 02/24/18 03/03/18  Long, Arlyss RepressJoshua G, MD  metroNIDAZOLE (FLAGYL) 500 MG tablet Take 1 tablet (500 mg total) by mouth 2 (two) times daily. Patient not taking: Reported on 02/24/2018 02/01/18   Marny LowensteinWenzel,  Julie N, PA-C  oxyCODONE-acetaminophen (PERCOCET/ROXICET) 5-325 MG tablet Take 1 tablet by mouth every 6 (six) hours as needed for severe pain. 02/24/18   Long, Arlyss RepressJoshua G, MD    Family History Family History  Problem Relation Age of Onset  . Diabetes Mother   . Migraines Mother   . Hypertension Mother   . Heart murmur Brother   . Cancer Paternal Grandfather     Social History Social History   Tobacco Use  . Smoking status: Former Smoker    Types: Cigars  . Smokeless tobacco: Never Used  Substance Use Topics  . Alcohol use: Not Currently    Frequency: Never  . Drug use: No     Allergies   Patient has no known allergies.   Review of Systems Review of Systems  All other systems reviewed and are negative.    Physical Exam Updated Vital Signs BP (!) 152/102 (BP Location: Right Arm)   Pulse 88   Temp 97.6 F (36.4 C) (Oral)   Resp 18   LMP 01/30/2018   SpO2 100%   Physical Exam  Constitutional: She is oriented to person, place, and time. She appears well-developed and well-nourished.  Non-toxic appearance. No distress.  HENT:  Head: Normocephalic and atraumatic.    Eyes: Pupils are equal, round, and reactive to light. Conjunctivae, EOM and lids are normal.  Neck: Normal range of motion. Neck supple. No tracheal deviation present. No thyroid mass present.  Cardiovascular: Normal rate, regular rhythm and normal heart sounds. Exam reveals no gallop.  No murmur heard. Pulmonary/Chest: Effort normal and breath sounds normal. No stridor. No respiratory distress. She has no decreased breath sounds. She has no wheezes. She has no rhonchi. She has no rales.  Abdominal: Soft. Normal appearance and bowel sounds are normal. She exhibits no distension. There is no tenderness. There is no rebound and no CVA tenderness.  Musculoskeletal: Normal range of motion. She exhibits no edema or tenderness.  Neurological: She is alert and oriented to person, place, and time. She has  normal strength. She displays no tremor. No cranial nerve deficit or sensory deficit. She displays a negative Romberg sign. GCS eye subscore is 4. GCS verbal subscore is 5. GCS motor subscore is 6.  Skin: Skin is warm and dry. No abrasion and no rash noted.  Psychiatric: She has a normal mood and affect. Her speech is normal and behavior is normal.  Nursing note and vitals reviewed.    ED Treatments / Results  Labs (all labs ordered are listed, but only abnormal results are displayed) Labs Reviewed - No data to display  EKG None  Radiology No results found.  Procedures Procedures (including critical care time)  Medications Ordered in ED Medications  metoCLOPramide (REGLAN) injection 10 mg (has no administration in time range)  diphenhydrAMINE (BENADRYL) injection 25 mg (has no administration in time range)  ketorolac (TORADOL) injection 60 mg (has no administration in time range)  ondansetron (ZOFRAN-ODT) disintegrating tablet 4 mg (4 mg Oral Given 03/01/18 1747)     Initial Impression / Assessment and Plan / ED Course  I have reviewed the triage vital signs and the nursing notes.  Pertinent labs & imaging results that were available during my care of the patient were reviewed by me and considered in my medical decision making (see chart for details).     Patient medicated for pain here feels better.  Postconcussive syndrome likely etiology of her findings currently.  Has neurology follow-up scheduled.  Final Clinical Impressions(s) / ED Diagnoses   Final diagnoses:  None    ED Discharge Orders    None       Lorre NickAllen, Xzavien Harada, MD 03/01/18 1956

## 2018-03-01 NOTE — ED Notes (Signed)
Pt left without signing or receiving discharge papers

## 2018-03-12 ENCOUNTER — Encounter (HOSPITAL_COMMUNITY): Payer: Self-pay | Admitting: *Deleted

## 2018-03-12 ENCOUNTER — Emergency Department (HOSPITAL_COMMUNITY)
Admission: EM | Admit: 2018-03-12 | Discharge: 2018-03-13 | Disposition: A | Discharge status: Home / Self Care | Payer: Medicaid Other | Attending: Emergency Medicine | Admitting: Emergency Medicine

## 2018-03-12 ENCOUNTER — Emergency Department (HOSPITAL_COMMUNITY): Payer: Medicaid Other

## 2018-03-12 DIAGNOSIS — Z4802 Encounter for removal of sutures: Secondary | ICD-10-CM | POA: Diagnosis not present

## 2018-03-12 DIAGNOSIS — Z87891 Personal history of nicotine dependence: Secondary | ICD-10-CM | POA: Diagnosis not present

## 2018-03-12 DIAGNOSIS — S0101XD Laceration without foreign body of scalp, subsequent encounter: Secondary | ICD-10-CM | POA: Insufficient documentation

## 2018-03-12 DIAGNOSIS — F419 Anxiety disorder, unspecified: Secondary | ICD-10-CM | POA: Insufficient documentation

## 2018-03-12 DIAGNOSIS — R0602 Shortness of breath: Secondary | ICD-10-CM | POA: Diagnosis not present

## 2018-03-12 DIAGNOSIS — R070 Pain in throat: Secondary | ICD-10-CM | POA: Diagnosis present

## 2018-03-12 DIAGNOSIS — Z79899 Other long term (current) drug therapy: Secondary | ICD-10-CM | POA: Diagnosis not present

## 2018-03-12 DIAGNOSIS — F515 Nightmare disorder: Secondary | ICD-10-CM

## 2018-03-12 DIAGNOSIS — X58XXXA Exposure to other specified factors, initial encounter: Secondary | ICD-10-CM | POA: Diagnosis not present

## 2018-03-12 DIAGNOSIS — J45909 Unspecified asthma, uncomplicated: Secondary | ICD-10-CM | POA: Insufficient documentation

## 2018-03-12 NOTE — ED Provider Notes (Signed)
[facility]-EMERGENCY DEPT Provider Note   CSN: [id number] Arrival date & time: 03/12/18  2224     History   Chief Complaint No chief complaint on file.   HPI Jodi Daniel is a 22 y.o. female.  The history is provided by the patient. No language interpreter was used.  Sore Throat  This is a new problem. The current episode started more than 2 days ago. The problem occurs constantly. Associated symptoms include shortness of breath. Pertinent negatives include no chest pain. Nothing aggravates the symptoms. Nothing relieves the symptoms. She has tried nothing for the symptoms. The treatment provided no relief.  Pt reports she has a history of asthma and anxiety.  Pt reports she has been on a new medication per her MD that she can not give me the name of.  Pt reports she is waking up from bad dreams and feeling like she can not breath.  Pt reports she feels like something is stuck in her throat.  Pt reports she can swallow.   Past Medical History:  Diagnosis Date  . Anxiety   . Asthma   . BV (bacterial vaginosis)   . Headache(784.0)   . Obese   . Panic   . PCOS (polycystic ovarian syndrome)   . UTI (lower urinary tract infection)     There are no active problems to display for this patient.   Past Surgical History:  Procedure Laterality Date  . CESAREAN SECTION    . FRACTURE SURGERY     thumb  . HERNIA REPAIR       OB History    Gravida  3   Para  1   Term      Preterm  1   AB  2   Living  1     SAB  2   TAB      Ectopic      Multiple      Live Births  1            Home Medications    Prior to Admission medications   Medication Sig Start Date End Date Taking? Authorizing Provider  albuterol (PROVENTIL HFA;VENTOLIN HFA) 108 (90 BASE) MCG/ACT inhaler Inhale 2 puffs into the lungs every 4 (four) hours as needed for wheezing or shortness of breath. 06/30/13   Gilda CreasePollina, Christopher J, MD  aspirin-acetaminophen-caffeine  (EXCEDRIN MIGRAINE) 267 250 0088250-250-65 MG tablet Take 1 tablet by mouth every 6 (six) hours as needed for headache or migraine.    [provider]  butalbital-acetaminophen-caffeine Marikay Alar(FIORICET, ESGIC) [phone] MG tablet Take 1-2 tablets by mouth every 6 (six) hours as needed for headache. 03/01/18 03/01/19  Lorre NickAllen, Anthony, MD  metroNIDAZOLE (FLAGYL) 500 MG tablet Take 1 tablet (500 mg total) by mouth 2 (two) times daily. Patient not taking: Reported on 02/24/2018 02/01/18   Marny LowensteinWenzel, Julie N, PA-C  oxyCODONE-acetaminophen (PERCOCET/ROXICET) 5-325 MG tablet Take 1 tablet by mouth every 6 (six) hours as needed for severe pain. 02/24/18   Long, Arlyss RepressJoshua G, MD    Family History Family History  Problem Relation Age of Onset  . Diabetes Mother   . Migraines Mother   . Hypertension Mother   . Heart murmur Brother   . Cancer Paternal Grandfather     Social History Social History   Tobacco Use  . Smoking status: Former Smoker    Types: Cigars  . Smokeless tobacco: Never Used  Substance Use Topics  . Alcohol use: Not Currently    Frequency: Never  .  Drug use: No     Allergies   Patient has no known allergies.   Review of Systems Review of Systems  Respiratory: Positive for shortness of breath.   Cardiovascular: Negative for chest pain.  All other systems reviewed and are negative.    Physical Exam Updated Vital Signs BP (!) 148/98 (BP Location: Left Arm)   Pulse 96   Temp 98.2 F (36.8 C) (Oral)   Resp 18   Wt 73.9 kg   LMP 03/08/2018   SpO2 100%   BMI 24.78 kg/m   Physical Exam  Constitutional: She is oriented to person, place, and time. She appears well-developed and well-nourished.  HENT:  Head: Normocephalic.  Right Ear: External ear normal.  Left Ear: External ear normal.  Nose: Nose normal.  Mouth/Throat: Oropharynx is clear and moist.  Staples left scalp  Healed laceration   Eyes: EOM are normal.  Neck: Normal range of motion.  Cardiovascular: Normal rate.    Pulmonary/Chest: Effort normal.  Abdominal: Soft. She exhibits no distension.  Musculoskeletal: Normal range of motion.  Neurological: She is alert and oriented to person, place, and time.  Skin: Skin is warm.  Psychiatric: She has a normal mood and affect.  Nursing note and vitals reviewed.    ED Treatments / Results  Labs (all labs ordered are listed, but only abnormal results are displayed) Labs Reviewed - No data to display  EKG None  Radiology No results found.  Procedures Procedures (including critical care time)  Medications Ordered in ED Medications - No data to display   Initial Impression / Assessment and Plan / ED Course  I have reviewed the triage vital signs and the nursing notes.  Pertinent labs & imaging results that were available during my care of the patient were reviewed by me and considered in my medical decision making (see chart for details).    MDM  Symptoms sound like anxiety,  Staples removed,  Soft tissue neck is normal. Pt advised that bad dreams can occur after concussion.  Pt advised to use her inhaler when she feels short of breath.  Pt does not want medication for anxiety.  Pt uses anxiety reduction techniques.   Final Clinical Impressions(s) / ED Diagnoses   Final diagnoses:  Encounter for staple removal  Anxiety  Bad dreams    ED Discharge Orders    None    An After Visit Summary was printed and given to the patient.    Elson Areas, PA-C 03/13/18 0003    Virgina Norfolk, DO 03/13/18 0006

## 2018-03-12 NOTE — ED Triage Notes (Signed)
Pt says that she is having a lot of pressure in her head and twitching in her eyes. Reports a tightness in her throat and is waking up at night feeling like she cannot breathe. She says that she is using her inhaler but it is not helping, lung sounds are clear in triage. NAD

## 2018-03-13 NOTE — Discharge Instructions (Addendum)
Return if any problems.

## 2018-04-19 LAB — PREGNANCY, URINE: PREG TEST UR: POSITIVE

## 2018-04-20 ENCOUNTER — Encounter (HOSPITAL_COMMUNITY): Payer: Self-pay

## 2018-04-20 ENCOUNTER — Inpatient Hospital Stay (HOSPITAL_COMMUNITY)
Admission: AD | Admit: 2018-04-20 | Discharge: 2018-04-20 | Disposition: A | Discharge status: Home / Self Care | Payer: Medicaid Other | Source: Ambulatory Visit | Attending: Family Medicine | Admitting: Family Medicine

## 2018-04-20 DIAGNOSIS — Z3A01 Less than 8 weeks gestation of pregnancy: Secondary | ICD-10-CM | POA: Insufficient documentation

## 2018-04-20 DIAGNOSIS — Z87891 Personal history of nicotine dependence: Secondary | ICD-10-CM | POA: Diagnosis not present

## 2018-04-20 DIAGNOSIS — Z7982 Long term (current) use of aspirin: Secondary | ICD-10-CM | POA: Insufficient documentation

## 2018-04-20 DIAGNOSIS — Z3201 Encounter for pregnancy test, result positive: Secondary | ICD-10-CM

## 2018-04-20 DIAGNOSIS — R109 Unspecified abdominal pain: Secondary | ICD-10-CM | POA: Diagnosis present

## 2018-04-20 DIAGNOSIS — O26891 Other specified pregnancy related conditions, first trimester: Secondary | ICD-10-CM | POA: Diagnosis not present

## 2018-04-20 LAB — URINALYSIS, ROUTINE W REFLEX MICROSCOPIC
Bilirubin Urine: NEGATIVE
Glucose, UA: NEGATIVE mg/dL
HGB URINE DIPSTICK: NEGATIVE
Ketones, ur: NEGATIVE mg/dL
Nitrite: NEGATIVE
PROTEIN: NEGATIVE mg/dL
Specific Gravity, Urine: 1.01 (ref 1.005–1.030)
pH: 8 (ref 5.0–8.0)

## 2018-04-20 LAB — POCT PREGNANCY, URINE: PREG TEST UR: NEGATIVE

## 2018-04-20 LAB — HCG, QUANTITATIVE, PREGNANCY: HCG, BETA CHAIN, QUANT, S: 65 m[IU]/mL — AB (ref <5)

## 2018-04-20 LAB — HCG, SERUM, QUALITATIVE: Preg, Serum: POSITIVE — AB

## 2018-04-20 MED ORDER — PRENATAL GUMMIES/DHA & FA 0.4-32.5 MG PO CHEW: 1.0000 | CHEWABLE_TABLET | Freq: Every day | ORAL | 12 refills | Status: DC

## 2018-04-20 NOTE — Discharge Instructions (Signed)
Safe Medications in Pregnancy  ° °Acne: °Benzoyl Peroxide °Salicylic Acid ° °Backache/Headache: °Tylenol: 2 regular strength every 4 hours OR °             2 Extra strength every 6 hours ° °Colds/Coughs/Allergies: °Benadryl (alcohol free) 25 mg every 6 hours as needed °Breath right strips °Claritin °Cepacol throat lozenges °Chloraseptic throat spray °Cold-Eeze- up to three times per day °Cough drops, alcohol free °Flonase (by prescription only) °Guaifenesin °Mucinex °Robitussin DM (plain only, alcohol free) °Saline nasal spray/drops °Sudafed (pseudoephedrine) & Actifed ** use only after [redacted] weeks gestation and if you do not have high blood pressure °Tylenol °Vicks Vaporub °Zinc lozenges °Zyrtec  ° °Constipation: °Colace °Ducolax suppositories °Fleet enema °Glycerin suppositories °Metamucil °Milk of magnesia °Miralax °Senokot °Smooth move tea ° °Diarrhea: °Kaopectate °Imodium A-D ° °*NO pepto Bismol ° °Hemorrhoids: °Anusol °Anusol HC °Preparation H °Tucks ° °Indigestion: °Tums °Maalox °Mylanta °Zantac  °Pepcid ° °Insomnia: °Benadryl (alcohol free) 25mg every 6 hours as needed °Tylenol PM °Unisom, no Gelcaps ° °Leg Cramps: °Tums °MagGel ° °Nausea/Vomiting:  °Bonine °Dramamine °Emetrol °Ginger extract °Sea bands °Meclizine  °Nausea medication to take during pregnancy:  °Unisom (doxylamine succinate 25 mg tablets) Take one tablet daily at bedtime. If symptoms are not adequately controlled, the dose can be increased to a maximum recommended dose of two tablets daily (1/2 tablet in the morning, 1/2 tablet mid-afternoon and one at bedtime). °Vitamin B6 100mg tablets. Take one tablet twice a day (up to 200 mg per day). ° °Skin Rashes: °Aveeno products °Benadryl cream or 25mg every 6 hours as needed °Calamine Lotion °1% cortisone cream ° °Yeast infection: °Gyne-lotrimin 7 °Monistat 7 ° ° °**If taking multiple medications, please check labels to avoid duplicating the same active ingredients °**take medication as directed on  the label °** Do not exceed 4000 mg of tylenol in 24 hours °**Do not take medications that contain aspirin or ibuprofen ° ° ° °[facility]  ° ° °[facility]       Phone: [phone] ° °[facility] [place] Phone: [phone] ° °[facility] [place]  Phone: [phone] ° °[facility] [place]  Phone: [phone] ° °[facility] [place]  Phone: [phone] ° °[facility]       Phone: [phone] ° °[facility]    Phone: [phone]  ° °[facility] ([place]    Phone: [phone] ° °[facility]    Phone: [phone] ° °[facility]    Phone: [phone] ° °[facility]    Phone: [phone] ° °[facility]       Phone: [phone]  ° °[facility]  Phone: [phone] ° °[facility]    Phone: [phone] ° °[facility]   Phone: [phone] ° °[facility]      Phone: [phone] ° °[facility]    Phone: [phone] ° ° °

## 2018-04-20 NOTE — MAU Note (Signed)
Found out she was pregnant. Was seen by neurologist for post concussion and states she is unsure about what meds she should and shouldn't be taking. States she needs to be seen about what meds she should be taking.  Reports l side pain but no bleeding

## 2018-04-20 NOTE — MAU Provider Note (Signed)
History     CSN: [id number]  Arrival date and time: 04/20/18 1731   First Provider Initiated Contact with Patient 04/20/18 1805      Chief Complaint  Patient presents with  . Possible Pregnancy  . Abdominal Pain   HPI  Ms.  Jodi Daniel is a 22 y.o. year old [id number] female at [redacted]w[redacted]d weeks gestation who presents to MAU reporting she just had a (+) UPT at [facility] on 04/19/18 and wants to know which of her medications is safe to take in pregnancy. She is being seen by a neurologist for post-concussion. She complains of nausea. She denies abdominal pain, vomiting, VB or vaginal d/c at this time.  Past Medical History:  Diagnosis Date  . Anxiety   . Asthma   . BV (bacterial vaginosis)   . Headache(784.0)   . Obese   . Panic   . PCOS (polycystic ovarian syndrome)   . UTI (lower urinary tract infection)     Past Surgical History:  Procedure Laterality Date  . CESAREAN SECTION    . FRACTURE SURGERY     thumb  . HERNIA REPAIR      Family History  Problem Relation Age of Onset  . Diabetes Mother   . Migraines Mother   . Hypertension Mother   . Heart murmur Brother   . Cancer Paternal Grandfather     Social History   Tobacco Use  . Smoking status: Former Smoker    Types: Cigars  . Smokeless tobacco: Never Used  Substance Use Topics  . Alcohol use: Not Currently    Frequency: Never  . Drug use: No    Allergies: No Known Allergies  Medications Prior to Admission  Medication Sig Dispense Refill Last Dose  . albuterol (PROVENTIL HFA;VENTOLIN HFA) 108 (90 BASE) MCG/ACT inhaler Inhale 2 puffs into the lungs every 4 (four) hours as needed for wheezing or shortness of breath. 1 Inhaler 2 03/12/2018 at Unknown time  . aspirin-acetaminophen-caffeine (EXCEDRIN MIGRAINE) 250-250-65 MG tablet Take 1 tablet by mouth every 6 (six) hours as needed for headache or migraine.   Past Week at Unknown time  . butalbital-acetaminophen-caffeine (FIORICET, ESGIC)  50-325-40 MG tablet Take 1-2 tablets by mouth every 6 (six) hours as needed for headache. 20 tablet 0 03/12/2018 at Unknown time  . FLOVENT HFA 110 MCG/ACT inhaler Inhale 2 puffs into the lungs 2 (two) times daily.   2 03/12/2018 at Unknown time  . ibuprofen (ADVIL,MOTRIN) 800 MG tablet Take 800 mg by mouth every 6 (six) hours as needed for moderate pain.   0 03/12/2018 at Unknown time    Review of Systems  Constitutional: Negative.   HENT: Negative.   Eyes: Negative.   Respiratory: Negative.   Cardiovascular: Negative.   Gastrointestinal: Positive for nausea.  Endocrine: Negative.   Genitourinary: Negative.   Musculoskeletal: Negative.   Skin: Negative.   Allergic/Immunologic: Negative.   Neurological: Negative.   Hematological: Negative.   Psychiatric/Behavioral: Negative.    Physical Exam   Blood pressure 121/68, pulse 96, temperature 97.9 F (36.6 C), temperature source Oral, weight 78.9 kg, last menstrual period 03/09/2018.  Physical Exam  Nursing note and vitals reviewed. Constitutional: She is oriented to person, place, and time. She appears well-developed and well-nourished.  HENT:  Head: Normocephalic and atraumatic.  Eyes: Pupils are equal, round, and reactive to light.  Neck: Normal range of motion.  Cardiovascular: Normal rate.  Respiratory: Effort normal.  GI: Soft.  Genitourinary:  Genitourinary Comments: Pelvic  deferred  Musculoskeletal: Normal range of motion.  Neurological: She is alert and oriented to person, place, and time.  Skin: Skin is warm and dry.  Psychiatric: She has a normal mood and affect. Her behavior is normal. Judgment and thought content normal.    MAU Course  Procedures  MDM [facility] UPT Qualitative HCG Beta HCG  Results for orders placed or performed during the hospital encounter of 04/20/18 (from the past 24 hour(s))  Urinalysis, Routine w reflex microscopic     Status: Abnormal   Collection Time: 04/20/18  5:58 PM  Result Value  Ref Range   Color, Urine STRAW (A) YELLOW   APPearance HAZY (A) CLEAR   Specific Gravity, Urine 1.010 1.005 - 1.030   pH 8.0 5.0 - 8.0   Glucose, UA NEGATIVE NEGATIVE mg/dL   Hgb urine dipstick NEGATIVE NEGATIVE   Bilirubin Urine NEGATIVE NEGATIVE   Ketones, ur NEGATIVE NEGATIVE mg/dL   Protein, ur NEGATIVE NEGATIVE mg/dL   Nitrite NEGATIVE NEGATIVE   Leukocytes, UA TRACE (A) NEGATIVE   RBC / HPF 0-5 0 - 5 RBC/hpf   WBC, UA 0-5 0 - 5 WBC/hpf   Bacteria, UA RARE (A) NONE SEEN   Squamous Epithelial / LPF 6-10 0 - 5  Pregnancy, urine POC     Status: None   Collection Time: 04/20/18  6:03 PM  Result Value Ref Range   Preg Test, Ur NEGATIVE NEGATIVE  hCG, serum, qualitative     Status: Abnormal   Collection Time: 04/20/18  6:36 PM  Result Value Ref Range   Preg, Serum POSITIVE (A) NEGATIVE  hCG, quantitative, pregnancy     Status: Abnormal   Collection Time: 04/20/18  6:36 PM  Result Value Ref Range   hCG, Beta Chain, Quant, S 65 (H) <5 mIU/mL     Assessment and Plan  Positive pregnancy test - Repeat HCG on Friday 04/23/18 with [facility] - Information provided on positive pregnancy test, hcg test - Keep scheduled appt for rpt HCG - Patient verbalized an understanding of the plan of care and agrees.   Raelyn Mora, MSN, CNM 04/20/2018, 6:05 PM

## 2018-04-23 ENCOUNTER — Ambulatory Visit (INDEPENDENT_AMBULATORY_CARE_PROVIDER_SITE_OTHER): Payer: Medicaid Other

## 2018-04-23 DIAGNOSIS — Z3201 Encounter for pregnancy test, result positive: Secondary | ICD-10-CM

## 2018-04-23 DIAGNOSIS — O0281 Inappropriate change in quantitative human chorionic gonadotropin (hCG) in early pregnancy: Secondary | ICD-10-CM

## 2018-04-23 NOTE — Progress Notes (Signed)
Pt here today for STAT beta lab.  Pt denies any bleeding or pain.  Confirmed with Dr. Marice Potter if lab needed to be a STAT because pt states that she only went to MAU for confirmation of pregnancy.  Per Dr. Marice Potter, if pt does not have any complaints then pt can leave and lab does not need to be a STAT.  Pt informed provider's recommendation and that we will call her with f/u.  Pt stated understanding with no further questions.

## 2018-04-24 LAB — BETA HCG QUANT (REF LAB): hCG Quant: 345 m[IU]/mL

## 2018-04-26 ENCOUNTER — Other Ambulatory Visit: Payer: Self-pay

## 2018-04-26 ENCOUNTER — Inpatient Hospital Stay (HOSPITAL_COMMUNITY)
Admission: AD | Admit: 2018-04-26 | Discharge: 2018-04-26 | Disposition: A | Discharge status: Home / Self Care | Payer: Medicaid Other | Source: Ambulatory Visit | Attending: Obstetrics and Gynecology | Admitting: Obstetrics and Gynecology

## 2018-04-26 DIAGNOSIS — B373 Candidiasis of vulva and vagina: Secondary | ICD-10-CM | POA: Diagnosis not present

## 2018-04-26 DIAGNOSIS — N898 Other specified noninflammatory disorders of vagina: Secondary | ICD-10-CM | POA: Diagnosis not present

## 2018-04-26 DIAGNOSIS — Z3A01 Less than 8 weeks gestation of pregnancy: Secondary | ICD-10-CM | POA: Diagnosis not present

## 2018-04-26 DIAGNOSIS — B3731 Acute candidiasis of vulva and vagina: Secondary | ICD-10-CM

## 2018-04-26 DIAGNOSIS — O98811 Other maternal infectious and parasitic diseases complicating pregnancy, first trimester: Secondary | ICD-10-CM | POA: Insufficient documentation

## 2018-04-26 DIAGNOSIS — L293 Anogenital pruritus, unspecified: Secondary | ICD-10-CM | POA: Diagnosis present

## 2018-04-26 LAB — URINALYSIS, ROUTINE W REFLEX MICROSCOPIC
Bilirubin Urine: NEGATIVE
Glucose, UA: NEGATIVE mg/dL
Hgb urine dipstick: NEGATIVE
Ketones, ur: NEGATIVE mg/dL
Nitrite: NEGATIVE
Protein, ur: NEGATIVE mg/dL
Specific Gravity, Urine: 1.006 (ref 1.005–1.030)
pH: 8 (ref 5.0–8.0)

## 2018-04-26 LAB — WET PREP, GENITAL
Clue Cells Wet Prep HPF POC: NONE SEEN
Sperm: NONE SEEN
Trich, Wet Prep: NONE SEEN

## 2018-04-26 MED ORDER — TERCONAZOLE 0.4 % VA CREA: 1.0000 | TOPICAL_CREAM | Freq: Every day | VAGINAL | 0 refills | Status: DC

## 2018-04-26 NOTE — MAU Provider Note (Signed)
History     CSN: [id number]  Arrival date and time: 04/26/18 1402   Chief Complaint  Patient presents with  . Vaginitis   Jodi Daniel is a 22 y.o. G4P1 at [redacted]w[redacted]d by LMP who presents to MAU with complaints of vaginal itching and vaginal discharge. She reports symptoms started 2 days ago, describes the discharge as white discharge without odor. She has had yeast infections in the past and believes this is a yeast infection. She denies trying OTC medication for treatment prior to coming to MAU. She denies changes in partners, abdominal pain, vaginal bleeding or urinary symptoms.    OB History    Gravida  4   Para  1   Term      Preterm  1   AB  2   Living  1     SAB  2   TAB      Ectopic      Multiple      Live Births  1           Past Medical History:  Diagnosis Date  . Anxiety   . Asthma   . BV (bacterial vaginosis)   . Headache(784.0)   . Obese   . Panic   . PCOS (polycystic ovarian syndrome)   . UTI (lower urinary tract infection)     Past Surgical History:  Procedure Laterality Date  . CESAREAN SECTION    . FRACTURE SURGERY     thumb  . HERNIA REPAIR      Family History  Problem Relation Age of Onset  . Diabetes Mother   . Migraines Mother   . Hypertension Mother   . Heart murmur Brother   . Cancer Paternal Grandfather     Social History   Tobacco Use  . Smoking status: Former Smoker    Types: Cigars  . Smokeless tobacco: Never Used  Substance Use Topics  . Alcohol use: Not Currently    Frequency: Never  . Drug use: No    Allergies: No Known Allergies  Medications Prior to Admission  Medication Sig Dispense Refill Last Dose  . albuterol (PROVENTIL HFA;VENTOLIN HFA) 108 (90 BASE) MCG/ACT inhaler Inhale 2 puffs into the lungs every 4 (four) hours as needed for wheezing or shortness of breath. 1 Inhaler 2 03/12/2018 at Unknown time  . FLOVENT HFA 110 MCG/ACT inhaler Inhale 2 puffs into the lungs 2 (two) times daily.   2  03/12/2018 at Unknown time  . Prenatal MV-Min-FA-Omega-3 (PRENATAL GUMMIES/DHA & FA) 0.4-32.5 MG CHEW Chew 1 tablet by mouth daily. 30 tablet 12     Review of Systems  Constitutional: Negative.   Respiratory: Negative.   Cardiovascular: Negative.   Gastrointestinal: Negative.   Genitourinary: Positive for vaginal discharge. Negative for difficulty urinating, dysuria, frequency, pelvic pain, urgency and vaginal bleeding.       Vaginal itching   Physical Exam   Blood pressure 125/80, pulse 87, temperature 98.1 F (36.7 C), resp. rate 18, last menstrual period 03/09/2018.  Physical Exam  Nursing note and vitals reviewed. Constitutional: She is oriented to person, place, and time. She appears well-developed and well-nourished. No distress.  Cardiovascular: Normal rate, regular rhythm and normal heart sounds.  Respiratory: Effort normal and breath sounds normal. No respiratory distress. She has no wheezes.  Genitourinary:  Genitourinary Comments: Vaginal swabs obtained by patient   Neurological: She is alert and oriented to person, place, and time.  Psychiatric: She has a normal mood and affect. Her behavior  is normal. Thought content normal.   MAU Course  Procedures  MDM Orders Placed This Encounter  Procedures  . Wet prep, genital  . Urinalysis, Routine w reflex microscopic   Results for orders placed or performed during the hospital encounter of 04/26/18 (from the past 48 hour(s))  Urinalysis, Routine w reflex microscopic     Status: Abnormal   Collection Time: 04/26/18  2:43 PM  Result Value Ref Range   Color, Urine STRAW (A) YELLOW   APPearance CLEAR CLEAR   Specific Gravity, Urine 1.006 1.005 - 1.030   pH 8.0 5.0 - 8.0   Glucose, UA NEGATIVE NEGATIVE mg/dL   Hgb urine dipstick NEGATIVE NEGATIVE   Bilirubin Urine NEGATIVE NEGATIVE   Ketones, ur NEGATIVE NEGATIVE mg/dL   Protein, ur NEGATIVE NEGATIVE mg/dL   Nitrite NEGATIVE NEGATIVE   Leukocytes, UA MODERATE (A)  NEGATIVE   RBC / HPF 0-5 0 - 5 RBC/hpf   WBC, UA 0-5 0 - 5 WBC/hpf   Bacteria, UA RARE (A) NONE SEEN   Squamous Epithelial / LPF 0-5 0 - 5    Comment: Performed at [facility], [address] [place], [place] [address]  Wet prep, genital     Status: Abnormal   Collection Time: 04/26/18  2:43 PM  Result Value Ref Range   Yeast Wet Prep HPF POC PRESENT (A) NONE SEEN   Trich, Wet Prep NONE SEEN NONE SEEN   Clue Cells Wet Prep HPF POC NONE SEEN NONE SEEN   WBC, Wet Prep HPF POC MANY (A) NONE SEEN    Comment: MANY BACTERIA SEEN   Sperm NONE SEEN     Comment: Performed at [facility], [address], [place], [place] [address]   Wet prep- positive for yeast infection  UA negative  GC/C pending   Discussed results of labs with patient. Rx for Terazol sent to pharmacy of choice for treatment. List of OB providers in [place] given. Make appointment to be seen to initiate care. Continue medication as prescribed and return to MAU as needed for emergencies. Pt stable at time of discharge   Assessment and Plan   1. Vulvovaginal candidiasis   2. Vaginal itching    Discharge home  Make initial prenatal appointment  Return to MAU as needed Rx for Terazol sent to pharmacy of choice   Allergies as of 04/26/2018   No Known Allergies     Medication List    TAKE these medications   albuterol 108 (90 Base) MCG/ACT inhaler Commonly known as:  PROVENTIL HFA;VENTOLIN HFA Inhale 2 puffs into the lungs every 4 (four) hours as needed for wheezing or shortness of breath.   FLOVENT HFA 110 MCG/ACT inhaler Generic drug:  fluticasone Inhale 2 puffs into the lungs 2 (two) times daily.   PRENATAL GUMMIES/DHA & FA 0.4-32.5 MG Chew Chew 1 tablet by mouth daily.   terconazole 0.4 % vaginal cream Commonly known as:  TERAZOL 7 Place 1 applicator vaginally at bedtime. Use for seven days      Sharyon Cable [facility] 04/26/2018, 4:08 PM

## 2018-04-26 NOTE — Discharge Instructions (Signed)

## 2018-04-26 NOTE — MAU Note (Signed)
Pt presents to MAU with complaints of vaginal itching with a white discharge and states she thinks she has a yeast infection Denies any pain

## 2018-04-26 NOTE — Progress Notes (Signed)
I have reviewed this chart and agree with the RN/CMA assessment and management.    Shasta Chinn C Nadean Montanaro, MD, FACOG Attending Physician, Faculty Practice [facility]  

## 2018-04-27 ENCOUNTER — Encounter: Payer: Self-pay | Admitting: *Deleted

## 2018-04-27 DIAGNOSIS — Z3201 Encounter for pregnancy test, result positive: Secondary | ICD-10-CM

## 2018-04-27 LAB — GC/CHLAMYDIA PROBE AMP (~~LOC~~) NOT AT ARMC
Chlamydia: NEGATIVE
Neisseria Gonorrhea: NEGATIVE

## 2018-05-07 ENCOUNTER — Inpatient Hospital Stay (HOSPITAL_COMMUNITY)
Admission: AD | Admit: 2018-05-07 | Discharge: 2018-05-07 | Disposition: A | Discharge status: Home / Self Care | Payer: Medicaid Other | Source: Ambulatory Visit | Attending: Obstetrics and Gynecology | Admitting: Obstetrics and Gynecology

## 2018-05-07 ENCOUNTER — Encounter (HOSPITAL_COMMUNITY): Payer: Self-pay | Admitting: *Deleted

## 2018-05-07 ENCOUNTER — Inpatient Hospital Stay (HOSPITAL_COMMUNITY): Payer: Medicaid Other

## 2018-05-07 ENCOUNTER — Other Ambulatory Visit: Payer: Self-pay

## 2018-05-07 DIAGNOSIS — O418X10 Other specified disorders of amniotic fluid and membranes, first trimester, not applicable or unspecified: Secondary | ICD-10-CM

## 2018-05-07 DIAGNOSIS — O4691 Antepartum hemorrhage, unspecified, first trimester: Secondary | ICD-10-CM | POA: Diagnosis not present

## 2018-05-07 DIAGNOSIS — Z3A01 Less than 8 weeks gestation of pregnancy: Secondary | ICD-10-CM | POA: Diagnosis not present

## 2018-05-07 DIAGNOSIS — O468X1 Other antepartum hemorrhage, first trimester: Secondary | ICD-10-CM

## 2018-05-07 DIAGNOSIS — Z6730 Type AB blood, Rh positive: Secondary | ICD-10-CM | POA: Diagnosis not present

## 2018-05-07 DIAGNOSIS — O209 Hemorrhage in early pregnancy, unspecified: Secondary | ICD-10-CM | POA: Insufficient documentation

## 2018-05-07 DIAGNOSIS — Z3201 Encounter for pregnancy test, result positive: Secondary | ICD-10-CM | POA: Insufficient documentation

## 2018-05-07 DIAGNOSIS — O4690 Antepartum hemorrhage, unspecified, unspecified trimester: Secondary | ICD-10-CM

## 2018-05-07 DIAGNOSIS — Z6790 Unspecified blood type, Rh positive: Secondary | ICD-10-CM

## 2018-05-07 LAB — URINALYSIS, ROUTINE W REFLEX MICROSCOPIC
BILIRUBIN URINE: NEGATIVE
Glucose, UA: NEGATIVE mg/dL
Ketones, ur: NEGATIVE mg/dL
Leukocytes, UA: NEGATIVE
Nitrite: NEGATIVE
Protein, ur: NEGATIVE mg/dL
SPECIFIC GRAVITY, URINE: 1.005 (ref 1.005–1.030)
pH: 8 (ref 5.0–8.0)

## 2018-05-07 NOTE — MAU Provider Note (Signed)
History     CSN: [id number]  Arrival date and time: 05/07/18 1458   First Provider Initiated Contact with Patient 05/07/18 [phone]      Chief Complaint  Patient presents with  . Vaginal Bleeding   [id number] @[redacted]w[redacted]d  by LMP here with VB. She saw red blood in the toilet about 2 hrs ago. No bleeding since. No recent IC. Denies pain.    OB History    Gravida  4   Para  1   Term      Preterm  1   AB  2   Living  1     SAB  2   TAB      Ectopic      Multiple      Live Births  1           Past Medical History:  Diagnosis Date  . Anxiety   . Asthma   . BV (bacterial vaginosis)   . Headache(784.0)   . Obese   . Panic   . PCOS (polycystic ovarian syndrome)   . UTI (lower urinary tract infection)     Past Surgical History:  Procedure Laterality Date  . CESAREAN SECTION    . FRACTURE SURGERY     thumb  . HERNIA REPAIR      Family History  Problem Relation Age of Onset  . Diabetes Mother   . Migraines Mother   . Hypertension Mother   . Heart murmur Brother   . Cancer Paternal Grandfather     Social History   Tobacco Use  . Smoking status: Former Smoker    Types: Cigars  . Smokeless tobacco: Never Used  Substance Use Topics  . Alcohol use: Not Currently    Frequency: Never  . Drug use: No    Allergies: No Known Allergies  Medications Prior to Admission  Medication Sig Dispense Refill Last Dose  . beclomethasone (QVAR) 80 MCG/ACT inhaler Inhale 1 puff into the lungs daily as needed.    04/23/2018  . Prenatal Vit-Fe Fumarate-FA (PRENATAL MULTIVITAMIN) TABS tablet Take 1 tablet by mouth daily at 12 noon.   05/07/2018 at Unknown time  . terconazole (TERAZOL 7) 0.4 % vaginal cream Place 1 applicator vaginally at bedtime. Use for seven days 45 g 0 Past Week at Unknown time  . albuterol (PROVENTIL HFA;VENTOLIN HFA) 108 (90 BASE) MCG/ACT inhaler Inhale 2 puffs into the lungs every 4 (four) hours as needed for wheezing or shortness of breath. 1 Inhaler  2 05/04/2018    Review of Systems  Gastrointestinal: Negative for abdominal pain.  Genitourinary: Positive for vaginal bleeding.   Physical Exam   Blood pressure 123/65, pulse 79, temperature 98.4 F (36.9 C), temperature source Oral, resp. rate 17, weight 85.4 kg, last menstrual period 03/09/2018, SpO2 100 %.  Physical Exam  Constitutional: She is oriented to person, place, and time. She appears well-developed and well-nourished. No distress.  HENT:  Head: Normocephalic and atraumatic.  Neck: Normal range of motion.  Cardiovascular: Normal rate.  Respiratory: Effort normal.  Genitourinary:  Genitourinary Comments: External: no lesions or erythema Vagina: rugated, pink, moist, small drk bloody discharge, cleared with 1 fox swab Uterus: + enlarged, anteverted, non tender, no CMT Adnexae: no masses, no tenderness left, no tenderness right Cervix closed, nml   Musculoskeletal: Normal range of motion.  Neurological: She is alert and oriented to person, place, and time.  Skin: Skin is warm and dry.  Psychiatric: She has a normal mood and affect.  Results for orders placed or performed during the hospital encounter of 05/07/18 (from the past 24 hour(s))  Urinalysis, Routine w reflex microscopic     Status: Abnormal   Collection Time: 05/07/18  4:05 PM  Result Value Ref Range   Color, Urine STRAW (A) YELLOW   APPearance CLEAR CLEAR   Specific Gravity, Urine 1.005 1.005 - 1.030   pH 8.0 5.0 - 8.0   Glucose, UA NEGATIVE NEGATIVE mg/dL   Hgb urine dipstick MODERATE (A) NEGATIVE   Bilirubin Urine NEGATIVE NEGATIVE   Ketones, ur NEGATIVE NEGATIVE mg/dL   Protein, ur NEGATIVE NEGATIVE mg/dL   Nitrite NEGATIVE NEGATIVE   Leukocytes, UA NEGATIVE NEGATIVE   RBC / HPF 0-5 0 - 5 RBC/hpf   WBC, UA 0-5 0 - 5 WBC/hpf   Bacteria, UA RARE (A) NONE SEEN   Squamous Epithelial / LPF 6-10 0 - 5   US Ob Less Than 14 Weeks With Ob Transvaginal  Result Date: 05/07/2018 CLINICAL DATA:   Vaginal bleeding in pregnancy. Gestational age by LMP of 8 weeks 3 days. EXAM: OBSTETRIC <14 WK [place] AND TRANSVAGINAL OB US TECHNIQUE: Both transabdominal and transvaginal ultrasound examinations were performed for complete evaluation of the gestation as well as the maternal uterus, adnexal regions, and pelvic cul-de-sac. Transvaginal technique was performed to assess early pregnancy. COMPARISON:  None. FINDINGS: Intrauterine gestational sac: Single Yolk sac:  Visualized. Embryo:  Visualized. Cardiac Activity: Visualized. Heart Rate: 107 bpm CRL:  5 mm   6 w   1 d                  [place] EDC: 12/30/2018 Subchorionic hemorrhage:  Small subchorionic hemorrhage noted. Maternal uterus/adnexae: Retroverted uterus. Normal appearance of both ovaries. No mass or abnormal free fluid identified. IMPRESSION: Single living IUP measuring 6 weeks 1 day, with [place] EDC of 12/30/2018. Small subchorionic hemorrhage noted. Electronically Signed   By: Myles Rosenthal M.D.   On: 05/07/2018 16:46   MAU Course  Procedures  MDM Labs and [place] ordered and reviewed. GC was neg recently, not repeated. Viable IUP on [place] with small SCH, discussed findings with pt. Stable for discharge home.  Assessment and Plan   1. [redacted] weeks gestation of pregnancy   2. Positive pregnancy test   3. Vaginal bleeding in pregnancy   4. Subchorionic hematoma in first trimester, single or unspecified fetus   5. Blood type, Rh positive    Discharge home SAB/bleeding precautions Follow up in [facility] as scheduled next month  Allergies as of 05/07/2018   No Known Allergies     Medication List    STOP taking these medications   terconazole 0.4 % vaginal cream Commonly known as:  TERAZOL 7     TAKE these medications   albuterol 108 (90 Base) MCG/ACT inhaler Commonly known as:  PROVENTIL HFA;VENTOLIN HFA Inhale 2 puffs into the lungs every 4 (four) hours as needed for wheezing or shortness of breath.   prenatal multivitamin Tabs tablet Take 1 tablet by mouth  daily at 12 noon.   QVAR 80 MCG/ACT inhaler Generic drug:  beclomethasone Inhale 1 puff into the lungs daily as needed.      Donette Larry, CNM 05/07/2018, 5:16 PM

## 2018-05-07 NOTE — Discharge Instructions (Signed)
Subchorionic Hematoma °A subchorionic hematoma is a gathering of blood between the outer wall of the placenta and the inner wall of the womb (uterus). The placenta is the organ that connects the fetus to the wall of the uterus. The placenta performs the feeding, breathing (oxygen to the fetus), and waste removal (excretory work) of the fetus. °Subchorionic hematoma is the most common abnormality found on a result from ultrasonography done during the first trimester or early second trimester of pregnancy. If there has been little or no vaginal bleeding, early small hematomas usually shrink on their own and do not affect your baby or pregnancy. The blood is gradually absorbed over 1-2 weeks. When bleeding starts later in pregnancy or the hematoma is larger or occurs in an older pregnant woman, the outcome may not be as good. Larger hematomas may get bigger, which increases the chances for miscarriage. Subchorionic hematoma also increases the risk of premature detachment of the placenta from the uterus, preterm (premature) labor, and stillbirth. °Follow these instructions at home: °· Stay on bed rest if your health care provider recommends this. Although bed rest will not prevent more bleeding or prevent a miscarriage, your health care provider may recommend bed rest until you are advised otherwise. °· Avoid heavy lifting (more than 10 lb [4.5 kg]), exercise, sexual intercourse, or douching as directed by your health care provider. °· Keep track of the number of pads you use each day and how soaked (saturated) they are. Write down this information. °· Do not use tampons. °· Keep all follow-up appointments as directed by your health care provider. Your health care provider may ask you to have follow-up blood tests or ultrasound tests or both. °Get help right away if: °· You have severe cramps in your stomach, back, abdomen, or pelvis. °· You have a fever. °· You pass large clots or tissue. Save any tissue for your  health care provider to look at. °· Your bleeding increases or you become lightheaded, feel weak, or have fainting episodes. °This information is not intended to replace advice given to you by your health care provider. Make sure you discuss any questions you have with your health care provider. °Document Released: 10/15/2006 Document Revised: 12/06/2015 Document Reviewed: 01/27/2013 °Elsevier Interactive Patient Education © 2017 Elsevier Inc. ° °

## 2018-05-07 NOTE — MAU Note (Signed)
Had some spotting about an hour ago. No pain.first episode of bleeding.

## 2018-06-01 ENCOUNTER — Encounter: Payer: Self-pay | Admitting: Advanced Practice Midwife

## 2018-06-08 ENCOUNTER — Encounter: Payer: Self-pay | Admitting: Student

## 2018-06-08 ENCOUNTER — Ambulatory Visit: Payer: Self-pay | Admitting: Clinical

## 2018-06-08 ENCOUNTER — Ambulatory Visit (INDEPENDENT_AMBULATORY_CARE_PROVIDER_SITE_OTHER): Payer: Medicaid Other | Admitting: Student

## 2018-06-08 VITALS — BP 115/78 | HR 80 | Wt 191.6 lb

## 2018-06-08 DIAGNOSIS — O0990 Supervision of high risk pregnancy, unspecified, unspecified trimester: Secondary | ICD-10-CM

## 2018-06-08 DIAGNOSIS — Z8759 Personal history of other complications of pregnancy, childbirth and the puerperium: Secondary | ICD-10-CM | POA: Insufficient documentation

## 2018-06-08 DIAGNOSIS — N96 Recurrent pregnancy loss: Secondary | ICD-10-CM | POA: Insufficient documentation

## 2018-06-08 DIAGNOSIS — Z98891 History of uterine scar from previous surgery: Secondary | ICD-10-CM

## 2018-06-08 DIAGNOSIS — O3432 Maternal care for cervical incompetence, second trimester: Secondary | ICD-10-CM

## 2018-06-08 HISTORY — DX: Recurrent pregnancy loss: N96

## 2018-06-08 HISTORY — DX: Personal history of other complications of pregnancy, childbirth and the puerperium: Z87.59

## 2018-06-08 NOTE — Progress Notes (Signed)
  Subjective:    Jodi Daniel is being seen today for her first obstetrical visit.  This is a planned pregnancy. She is at 5353w5d gestation. Her obstetrical history is significant for history of c-section, PPROM and placental abruption. . She denies any problems with her blood pressure in the past; did not have pre-eclampsia or gestational hypertension.  Relationship with FOB: significant other not living together. . Patient does not intend to breast feed. Pregnancy history fully reviewed.  Patient reports no complaints.  Review of Systems:   Review of Systems  Constitutional: Negative.   HENT: Negative.   Respiratory: Negative.   Cardiovascular: Negative.   Gastrointestinal: Negative.   Genitourinary: Negative.   Musculoskeletal: Negative.     Objective:     BP 115/78   Pulse 80   Wt 191 lb 9.6 oz (86.9 kg)   LMP 03/09/2018   BMI 29.13 kg/m  Physical Exam  Constitutional: She appears well-developed.  HENT:  Head: Normocephalic.  Respiratory: Effort normal.  GI: Soft.  Musculoskeletal: Normal range of motion.  Neurological: She is alert.  Skin: Skin is warm and dry.    Exam    Assessment:    Pregnancy: [id number] Patient Active Problem List   Diagnosis Date Noted  . Supervision of high risk pregnancy, antepartum 06/08/2018  . History of recurrent miscarriages 06/08/2018  . Preterm delivery 06/08/2018  . History of C-section 06/08/2018  . Placental abruption 06/08/2018  . Positive pregnancy test 04/20/2018       Plan:     Initial labs drawn. Prenatal vitamins. Problem list reviewed and updated. AFP3 discussed: will do NIPS. Role of ultrasound in pregnancy discussed; fetal survey: will order at next visit. . Amniocentesis discussed: not indicated. Follow up in 4 weeks. 50% of 30 min visit spent on counseling and coordination of care.  -introduced patient to practice -plans repeat C-section -blood pressure normal today.  -plan for detailed US -patient  wants to think about 17P; she is concerned about the side effects and does not like getting a shot every week.   Jodi GaribaldiKathryn Lorraine [facility] 06/08/2018

## 2018-06-08 NOTE — BH Specialist Note (Signed)
error 

## 2018-06-08 NOTE — BH Specialist Note (Signed)
Integrated Behavioral Health Initial Visit  MRN: [id number] Name: Jodi Daniel  Number of Integrated Behavioral Health Clinician visits:: 1/6 Session Start time: 3:06  Session End time: 3:17 Total time: 15 minutes  Type of Service: Integrated Behavioral Health- Individual/Family Interpretor:No. Interpretor Name and Language: n/a   Warm Hand Off Completed.       SUBJECTIVE: Jodi Pernaleyah Spranger is a 22 y.o. female accompanied by n/a Patient was referred by Luna KitchensKathryn Kooistra, CNM for Initial OB introduction to integrated behavioral health services . Patient reports the following symptoms/concerns: Pt states no particular concerns today.  Duration of problem: n/a; Severity of problem: n/a  OBJECTIVE: Mood: Normal and Affect: Appropriate Risk of harm to self or others: No plan to harm self or others  LIFE CONTEXT: Family and Social: - School/Work: - Self-Care: - Life Changes: Current pregnancy  GOALS ADDRESSED: Patient will: 1. Increase knowledge and/or ability of: healthy habits   INTERVENTIONS: Interventions utilized: Psychoeducation and/or Health Education  Standardized Assessments completed: GAD-7 and PHQ 9  ASSESSMENT: Patient currently experiencing Supervision of high risk pregnancy, antepartum.   Patient may benefit from Initial OB introduction to integrated behavioral health services .  PLAN: 1. Follow up with behavioral health clinician on : As needed 2. Behavioral recommendations:  -Continue taking prenatal vitamin, as recommended by medical provider 3. Referral(s): [facility] 4. "From scale of 1-10, how likely are you to follow plan?": 10  Jamie C McMannes, LCSW

## 2018-06-08 NOTE — Patient Instructions (Signed)
Preventing Preterm Birth °Preterm birth is when your baby is delivered between 20 weeks and 37 weeks of pregnancy. A full-term pregnancy lasts for at least 37 weeks. Preterm birth can be dangerous for your baby because the last few weeks of pregnancy are an important time for your baby's brain and lungs to grow. Many things can cause a baby to be born early. Sometimes the cause is not known. There are certain factors that make you more likely to experience preterm birth, such as: °· Having a previous baby born preterm. °· Being pregnant with twins or other multiples. °· Having had fertility treatment. °· Being overweight or underweight at the start of your pregnancy. °· Having any of the following during pregnancy: °? An infection, including a urinary tract infection (UTI) or an STI (sexually transmitted infection). °? High blood pressure. °? Diabetes. °? Vaginal bleeding. °· Being age 35 or older. °· Being age 18 or younger. °· Getting pregnant within 6 months of a previous pregnancy. °· Suffering extreme stress or physical or emotional abuse during pregnancy. °· Standing for long periods of time during pregnancy, such as working at a job that requires standing. ° °What are the risks? °The most serious risk of preterm birth is that the baby may not survive. This is more likely to happen if a baby is born before 34 weeks. Other risks and complications of preterm birth may include your baby having: °· Breathing problems. °· Brain damage that affects movement and coordination (cerebral palsy). °· Feeding difficulties. °· Vision or hearing problems. °· Infections or inflammation of the digestive tract (colitis). °· Developmental delays. °· Learning disabilities. °· Higher risk for diabetes, heart disease, and high blood pressure later in life. ° °What can I do to lower my risk? °Medical care ° °The most important thing you can do to lower your risk for preterm birth is to get routine medical care during pregnancy  (prenatal care). If you have a high risk of preterm birth, you may be referred to a health care provider who specializes in managing high-risk pregnancies (perinatologist). You may be given medicine to help prevent preterm birth. °Lifestyle changes °Certain lifestyle changes can also lower your risk of preterm birth: °· Wait at least 6 months after a pregnancy to become pregnant again. °· Try to plan pregnancy for when you are between 19 and 35 years old. °· Get to a healthy weight before getting pregnant. If you are overweight, work with your health care provider to safely lose weight. °· Do not use any products that contain nicotine or tobacco, such as cigarettes and e-cigarettes. If you need help quitting, ask your health care provider. °· Do not drink alcohol. °· Do not use drugs. ° °Where to find support: °For more support, consider: °· Talking with your health care provider. °· Talking with a therapist or substance abuse counselor, if you need help quitting. °· Working with a diet and nutrition specialist (dietitian) or a personal trainer to maintain a healthy weight. °· Joining a support group. ° °Where to find more information: °Learn more about preventing preterm birth from: °· Centers for Disease Control and Prevention: cdc.gov/reproductivehealth/maternalinfanthealth/pretermbirth.htm °· March of Dimes: marchofdimes.org/complications/premature-babies.aspx °· American Pregnancy Association: americanpregnancy.org/labor-and-birth/premature-labor ° °Contact a health care provider if: °· You have any of the following signs of preterm labor before 37 weeks: °? A change or increase in vaginal discharge. °? Fluid leaking from your vagina. °? Pressure or cramps in your lower abdomen. °? A backache that does not   go away or gets worse. °? Regular tightening (contractions) in your lower abdomen. °Summary °· Preterm birth means having your baby during weeks 20-37 of pregnancy. °· Preterm birth may put your baby at risk  for physical and mental problems. °· Getting good prenatal care can help prevent preterm birth. °· You can lower your risk of preterm birth by making certain lifestyle changes, such as not smoking and not using alcohol. °This information is not intended to replace advice given to you by your health care provider. Make sure you discuss any questions you have with your health care provider. °Document Released: 08/14/2015 Document Revised: 03/08/2016 Document Reviewed: 03/08/2016 °Elsevier Interactive Patient Education © 2018 Elsevier Inc. ° °

## 2018-06-09 LAB — POCT URINALYSIS DIP (DEVICE)
Bilirubin Urine: NEGATIVE
Glucose, UA: NEGATIVE mg/dL
HGB URINE DIPSTICK: NEGATIVE
KETONES UR: NEGATIVE mg/dL
Leukocytes, UA: NEGATIVE
Nitrite: NEGATIVE
PH: 8.5 — AB (ref 5.0–8.0)
Protein, ur: NEGATIVE mg/dL
SPECIFIC GRAVITY, URINE: 1.02 (ref 1.005–1.030)
Urobilinogen, UA: 0.2 mg/dL (ref 0.0–1.0)

## 2018-06-10 LAB — URINE CULTURE, OB REFLEX: ORGANISM ID, BACTERIA: NO GROWTH

## 2018-06-10 LAB — CULTURE, OB URINE

## 2018-06-16 ENCOUNTER — Inpatient Hospital Stay (HOSPITAL_COMMUNITY): Payer: Medicaid Other

## 2018-06-16 ENCOUNTER — Inpatient Hospital Stay (HOSPITAL_COMMUNITY)
Admission: AD | Admit: 2018-06-16 | Discharge: 2018-06-16 | Disposition: A | Discharge status: Home / Self Care | Payer: Medicaid Other | Attending: Obstetrics & Gynecology | Admitting: Obstetrics & Gynecology

## 2018-06-16 ENCOUNTER — Encounter (HOSPITAL_COMMUNITY): Payer: Self-pay | Admitting: *Deleted

## 2018-06-16 DIAGNOSIS — O26891 Other specified pregnancy related conditions, first trimester: Secondary | ICD-10-CM | POA: Diagnosis not present

## 2018-06-16 DIAGNOSIS — R1032 Left lower quadrant pain: Secondary | ICD-10-CM | POA: Diagnosis not present

## 2018-06-16 DIAGNOSIS — R51 Headache: Secondary | ICD-10-CM

## 2018-06-16 DIAGNOSIS — O219 Vomiting of pregnancy, unspecified: Secondary | ICD-10-CM | POA: Diagnosis not present

## 2018-06-16 DIAGNOSIS — Z3A11 11 weeks gestation of pregnancy: Secondary | ICD-10-CM | POA: Diagnosis not present

## 2018-06-16 DIAGNOSIS — R519 Headache, unspecified: Secondary | ICD-10-CM

## 2018-06-16 DIAGNOSIS — O210 Mild hyperemesis gravidarum: Secondary | ICD-10-CM | POA: Insufficient documentation

## 2018-06-16 LAB — CBC WITH DIFFERENTIAL/PLATELET
BASOS PCT: 0 %
Basophils Absolute: 0 10*3/uL (ref 0.0–0.1)
EOS ABS: 0.1 10*3/uL (ref 0.0–0.5)
Eosinophils Relative: 1 %
HCT: 38.4 % (ref 36.0–46.0)
HEMOGLOBIN: 12.7 g/dL (ref 12.0–15.0)
LYMPHS ABS: 1.9 10*3/uL (ref 0.7–4.0)
Lymphocytes Relative: 30 %
MCH: 31.4 pg (ref 26.0–34.0)
MCHC: 33.1 g/dL (ref 30.0–36.0)
MCV: 94.8 fL (ref 80.0–100.0)
MONOS PCT: 6 %
Monocytes Absolute: 0.4 10*3/uL (ref 0.1–1.0)
NEUTROS PCT: 63 %
Neutro Abs: 3.9 10*3/uL (ref 1.7–7.7)
Platelets: 243 10*3/uL (ref 150–400)
RBC: 4.05 MIL/uL (ref 3.87–5.11)
RDW: 12.4 % (ref 11.5–15.5)
WBC: 6.2 10*3/uL (ref 4.0–10.5)
nRBC: 0 % (ref 0.0–0.2)

## 2018-06-16 LAB — URINALYSIS, ROUTINE W REFLEX MICROSCOPIC
BILIRUBIN URINE: NEGATIVE
GLUCOSE, UA: NEGATIVE mg/dL
Hgb urine dipstick: NEGATIVE
KETONES UR: NEGATIVE mg/dL
LEUKOCYTES UA: NEGATIVE
NITRITE: NEGATIVE
PH: 6 (ref 5.0–8.0)
PROTEIN: NEGATIVE mg/dL
Specific Gravity, Urine: 1.006 (ref 1.005–1.030)

## 2018-06-16 LAB — COMPREHENSIVE METABOLIC PANEL
ALBUMIN: 3.4 g/dL — AB (ref 3.5–5.0)
ALT: 14 U/L (ref 0–44)
ANION GAP: 7 (ref 5–15)
AST: 20 U/L (ref 15–41)
Alkaline Phosphatase: 51 U/L (ref 38–126)
BUN: 7 mg/dL (ref 6–20)
CALCIUM: 8.9 mg/dL (ref 8.9–10.3)
CO2: 23 mmol/L (ref 22–32)
Chloride: 105 mmol/L (ref 98–111)
Creatinine, Ser: 0.62 mg/dL (ref 0.44–1.00)
GFR calc non Af Amer: >60 mL/min (ref >60)
GLUCOSE: 88 mg/dL (ref 70–99)
POTASSIUM: 3.5 mmol/L (ref 3.5–5.1)
SODIUM: 135 mmol/L (ref 135–145)
Total Bilirubin: 0.3 mg/dL (ref 0.3–1.2)
Total Protein: 6.8 g/dL (ref 6.5–8.1)

## 2018-06-16 LAB — LIPASE, BLOOD: LIPASE: 41 U/L (ref 11–51)

## 2018-06-16 MED ORDER — PROMETHAZINE HCL 25 MG/ML IJ SOLN
12.5000 mg | Freq: Once | INTRAMUSCULAR | Status: AC
  Administered 2018-06-16: 12.5 mg via INTRAMUSCULAR
  Filled 2018-06-16: qty 1

## 2018-06-16 MED ORDER — SIMETHICONE 80 MG PO CHEW: 80.0000 mg | CHEWABLE_TABLET | Freq: Four times a day (QID) | ORAL | 0 refills | Status: DC | PRN

## 2018-06-16 MED ORDER — SIMETHICONE 80 MG PO CHEW
80.0000 mg | CHEWABLE_TABLET | Freq: Once | ORAL | Status: AC
  Administered 2018-06-16: 80 mg via ORAL
  Filled 2018-06-16: qty 1

## 2018-06-16 MED ORDER — METOCLOPRAMIDE HCL 10 MG PO TABS: 10.0000 mg | ORAL_TABLET | Freq: Four times a day (QID) | ORAL | 0 refills | Status: DC

## 2018-06-16 MED ORDER — BUTALBITAL-APAP-CAFFEINE 50-325-40 MG PO TABS
2.0000 | ORAL_TABLET | Freq: Once | ORAL | Status: AC
  Administered 2018-06-16: 2 via ORAL
  Filled 2018-06-16: qty 2

## 2018-06-16 NOTE — Discharge Instructions (Signed)
Morning Sickness Morning sickness is when you feel sick to your stomach (nauseous) during pregnancy. This nauseous feeling may or may not come with vomiting. It often occurs in the morning but can be a problem any time of day. Morning sickness is most common during the first trimester, but it may continue throughout pregnancy. While morning sickness is unpleasant, it is usually harmless unless you develop severe and continual vomiting (hyperemesis gravidarum). This condition requires more intense treatment. What are the causes? The cause of morning sickness is not completely known but seems to be related to normal hormonal changes that occur in pregnancy. What increases the risk? You are at greater risk if you:  Experienced nausea or vomiting before your pregnancy.  Had morning sickness during a previous pregnancy.  Are pregnant with more than one baby, such as twins.  How is this treated? Do not use any medicines (prescription, over-the-counter, or herbal) for morning sickness without first talking to your health care provider. Your health care provider may prescribe or recommend:  Vitamin B6 supplements.  Anti-nausea medicines.  The herbal medicine ginger.  Follow these instructions at home:  Only take over-the-counter or prescription medicines as directed by your health care provider.  Taking multivitamins before getting pregnant can prevent or decrease the severity of morning sickness in most women.  Eat a piece of dry toast or unsalted crackers before getting out of bed in the morning.  Eat five or six small meals a day.  Eat dry and bland foods (rice, baked potato). Foods high in carbohydrates are often helpful.  Do not drink liquids with your meals. Drink liquids between meals.  Avoid greasy, fatty, and spicy foods.  Get someone to cook for you if the smell of any food causes nausea and vomiting.  If you feel nauseous after taking prenatal vitamins, take the vitamins at  night or with a snack.  Snack on protein foods (nuts, yogurt, cheese) between meals if you are hungry.  Eat unsweetened gelatins for desserts.  Wearing an acupressure wristband (worn for sea sickness) may be helpful.  Acupuncture may be helpful.  Do not smoke.  Get a humidifier to keep the air in your house free of odors.  Get plenty of fresh air. Contact a health care provider if:  Your home remedies are not working, and you need medicine.  You feel dizzy or lightheaded.  You are losing weight. Get help right away if:  You have persistent and uncontrolled nausea and vomiting.  You pass out (faint). This information is not intended to replace advice given to you by your health care provider. Make sure you discuss any questions you have with your health care provider. Document Released: 08/21/2006 Document Revised: 12/06/2015 Document Reviewed: 12/15/2012 Elsevier Interactive Patient Education  2017 [employer] General Headache Without Cause A headache is pain or discomfort felt around the head or neck area. The specific cause of a headache may not be found. There are many causes and types of headaches. A few common ones are:  Tension headaches.  Migraine headaches.  Cluster headaches.  Chronic daily headaches.  Follow these instructions at home: Watch your condition for any changes. Take these steps to help with your condition: Managing pain  Take over-the-counter and prescription medicines only as told by your health care provider.  Lie down in a dark, quiet room when you have a headache.  If directed, apply ice to the head and neck area: ? Put ice in a plastic bag. ? Place  a towel between your skin and the bag. ? Leave the ice on for 20 minutes, 2-3 times per day.  Use a heating pad or hot shower to apply heat to the head and neck area as told by your health care provider.  Keep lights dim if bright lights bother you or make your headaches worse. Eating  and drinking  Eat meals on a regular schedule.  Limit alcohol use.  Decrease the amount of caffeine you drink, or stop drinking caffeine. General instructions  Keep all follow-up visits as told by your health care provider. This is important.  Keep a headache journal to help find out what may trigger your headaches. For example, write down: ? What you eat and drink. ? How much sleep you get. ? Any change to your diet or medicines.  Try massage or other relaxation techniques.  Limit stress.  Sit up straight, and do not tense your muscles.  Do not use tobacco products, including cigarettes, chewing tobacco, or e-cigarettes. If you need help quitting, ask your health care provider.  Exercise regularly as told by your health care provider.  Sleep on a regular schedule. Get 7-9 hours of sleep, or the amount recommended by your health care provider. Contact a health care provider if:  Your symptoms are not helped by medicine.  You have a headache that is different from the usual headache.  You have nausea or you vomit.  You have a fever. Get help right away if:  Your headache becomes severe.  You have repeated vomiting.  You have a stiff neck.  You have a loss of vision.  You have problems with speech.  You have pain in the eye or ear.  You have muscular weakness or loss of muscle control.  You lose your balance or have trouble walking.  You feel faint or pass out.  You have confusion. This information is not intended to replace advice given to you by your health care provider. Make sure you discuss any questions you have with your health care provider. Document Released: 06/30/2005 Document Revised: 12/06/2015 Document Reviewed: 10/23/2014 Elsevier Interactive Patient Education  [employer].

## 2018-06-16 NOTE — MAU Note (Signed)
Pt reports abdominal pain that feels like bloating.

## 2018-06-16 NOTE — MAU Provider Note (Signed)
Chief Complaint: Abdominal Pain; Headache; and Morning Sickness   First Provider Initiated Contact with Patient 06/16/18 2008     SUBJECTIVE HPI: Jodi Daniel is a 22 y.o. [id number] at 6259w6d who presents to [facility] reporting n/v, abdominal pain, & headache.  Has had n/v with pregnancy that has worsened in the last few days. Reports that she vomits after anything that she eats or drinks for the last 2 days.  Also reports lower abdominal pain that is worse in LLQ. Rates pain 6/10. Hasn't treated symptoms. States she feels bloated & like she needs to pass gas but she can't. Has had issues with constipation. Last BM was 2 days ago, loose stool.  Also reports headache today. Has hx of headaches & migraines. States this feels like a "regular" headache to her, not a migraine. Frontal headache that she rates 5/10. Has not treated headache d/t the vomiting.  Denies fever/chills, dysuria, vaginal discharge, or vaginal bleeding.    Past Medical History:  Diagnosis Date  . Anxiety   . Asthma   . BV (bacterial vaginosis)   . Headache(784.0)   . Obese   . Panic   . PCOS (polycystic ovarian syndrome)   . UTI (lower urinary tract infection)    OB History  Gravida Para Term Preterm AB Living  4 1   1 2 1   SAB TAB Ectopic Multiple Live Births  2       1    # Outcome Date GA Lbr Len/2nd Weight Sex Delivery Anes PTL Lv  4 Current           3 SAB 01/2017 3666w0d         2 SAB 2016          1 Preterm 08/24/11 6771w0d  2438 g F CS-LTranv   LIV     Complications: Abruptio Placenta   Past Surgical History:  Procedure Laterality Date  . CESAREAN SECTION    . FRACTURE SURGERY     thumb  . HERNIA REPAIR     Social History   Socioeconomic History  . Marital status: Single    Spouse name: Not on file  . Number of children: Not on file  . Years of education: Not on file  . Highest education level: Not on file  Occupational History  . Not on file  Social Needs  . Financial resource  strain: Not on file  . Food insecurity:    Worry: Not on file    Inability: Not on file  . Transportation needs:    Medical: Not on file    Non-medical: Not on file  Tobacco Use  . Smoking status: Former Smoker    Types: Cigars  . Smokeless tobacco: Never Used  Substance and Sexual Activity  . Alcohol use: Not Currently    Frequency: Never  . Drug use: No  . Sexual activity: Yes    Birth control/protection: None  Lifestyle  . Physical activity:    Days per week: Not on file    Minutes per session: Not on file  . Stress: Not on file  Relationships  . Social connections:    Talks on phone: Not on file    Gets together: Not on file    Attends religious service: Not on file    Active member of club or organization: Not on file    Attends meetings of clubs or organizations: Not on file    Relationship status: Not on file  . Intimate partner  violence:    Fear of current or ex partner: Not on file    Emotionally abused: Not on file    Physically abused: Not on file    Forced sexual activity: Not on file  Other Topics Concern  . Not on file  Social History Narrative  . Not on file   Family History  Problem Relation Age of Onset  . Diabetes Mother   . Migraines Mother   . Hypertension Mother   . Heart murmur Brother   . Cancer Paternal Grandfather    No current facility-administered medications on file prior to encounter.    Current Outpatient Medications on File Prior to Encounter  Medication Sig Dispense Refill  . albuterol (PROVENTIL HFA;VENTOLIN HFA) 108 (90 BASE) MCG/ACT inhaler Inhale 2 puffs into the lungs every 4 (four) hours as needed for wheezing or shortness of breath. 1 Inhaler 2  . beclomethasone (QVAR) 80 MCG/ACT inhaler Inhale 1 puff into the lungs daily as needed.     . Prenatal Vit-Fe Fumarate-FA (PRENATAL MULTIVITAMIN) TABS tablet Take 1 tablet by mouth daily at 12 noon.     No Known Allergies  I have reviewed patient's Past Medical Hx, Surgical Hx,  Family Hx, Social Hx, medications and allergies.   Review of Systems  Constitutional: Negative for chills and fever.  Gastrointestinal: Positive for abdominal distention, abdominal pain, constipation, nausea and vomiting.  Genitourinary: Negative.   Neurological: Positive for headaches.    OBJECTIVE Patient Vitals for the past 24 hrs:  BP Temp Temp src Pulse Resp SpO2 Height Weight  06/16/18 2252 115/66 - - 73 - - - -  06/16/18 1754 132/69 98.1 F (36.7 C) Oral 79 15 98 % 5\' 8"  (1.727 m) 87.1 kg   Constitutional: Well-developed, well-nourished female in no acute distress.  Cardiovascular: normal rate & rhythm, no murmur Respiratory: normal rate and effort. Lung sounds clear throughout GI: TTP in LLQ. Abd soft, Pos BS x 4. No guarding or rebound tenderness MS: Extremities nontender, no edema, normal ROM Neurologic: Alert and oriented x 4.  GU:  Cervix closed   LAB RESULTS Results for orders placed or performed during the hospital encounter of 06/16/18 (from the past 24 hour(s))  Urinalysis, Routine w reflex microscopic     Status: Abnormal   Collection Time: 06/16/18  6:14 PM  Result Value Ref Range   Color, Urine YELLOW YELLOW   APPearance HAZY (A) CLEAR   Specific Gravity, Urine 1.006 1.005 - 1.030   pH 6.0 5.0 - 8.0   Glucose, UA NEGATIVE NEGATIVE mg/dL   Hgb urine dipstick NEGATIVE NEGATIVE   Bilirubin Urine NEGATIVE NEGATIVE   Ketones, ur NEGATIVE NEGATIVE mg/dL   Protein, ur NEGATIVE NEGATIVE mg/dL   Nitrite NEGATIVE NEGATIVE   Leukocytes, UA NEGATIVE NEGATIVE  CBC with Differential/Platelet     Status: None   Collection Time: 06/16/18  8:35 PM  Result Value Ref Range   WBC 6.2 4.0 - 10.5 K/uL   RBC 4.05 3.87 - 5.11 MIL/uL   Hemoglobin 12.7 12.0 - 15.0 g/dL   HCT 13.0 86.5 - 78.4 %   MCV 94.8 80.0 - 100.0 fL   MCH 31.4 26.0 - 34.0 pg   MCHC 33.1 30.0 - 36.0 g/dL   RDW 69.6 29.5 - 28.4 %   Platelets 243 150 - 400 K/uL   nRBC 0.0 0.0 - 0.2 %   Neutrophils  Relative % 63 %   Neutro Abs 3.9 1.7 - 7.7 K/uL   Lymphocytes  Relative 30 %   Lymphs Abs 1.9 0.7 - 4.0 K/uL   Monocytes Relative 6 %   Monocytes Absolute 0.4 0.1 - 1.0 K/uL   Eosinophils Relative 1 %   Eosinophils Absolute 0.1 0.0 - 0.5 K/uL   Basophils Relative 0 %   Basophils Absolute 0.0 0.0 - 0.1 K/uL  Comprehensive metabolic panel     Status: Abnormal   Collection Time: 06/16/18  8:35 PM  Result Value Ref Range   Sodium 135 135 - 145 mmol/L   Potassium 3.5 3.5 - 5.1 mmol/L   Chloride 105 98 - 111 mmol/L   CO2 23 22 - 32 mmol/L   Glucose, Bld 88 70 - 99 mg/dL   BUN 7 6 - 20 mg/dL   Creatinine, Ser 1.61 0.44 - 1.00 mg/dL   Calcium 8.9 8.9 - 09.6 mg/dL   Total Protein 6.8 6.5 - 8.1 g/dL   Albumin 3.4 (L) 3.5 - 5.0 g/dL   AST 20 15 - 41 U/L   ALT 14 0 - 44 U/L   Alkaline Phosphatase 51 38 - 126 U/L   Total Bilirubin 0.3 0.3 - 1.2 mg/dL   GFR calc non Af Amer >60 >60 mL/min   GFR calc Af Amer >60 >60 mL/min   Anion gap 7 5 - 15  Lipase, blood     Status: None   Collection Time: 06/16/18  8:35 PM  Result Value Ref Range   Lipase 41 11 - 51 U/L    IMAGING US Ob Comp Less 14 Wks  Result Date: 06/16/2018 CLINICAL DATA:  21 y/o F; left lower quadrant abdominal pain for 2 days. EXAM: OBSTETRIC <14 WK ULTRASOUND TECHNIQUE: Transabdominal ultrasound was performed for evaluation of the gestation as well as the maternal uterus and adnexal regions. COMPARISON:  None. FINDINGS: Intrauterine gestational sac: Single Yolk sac:  Not Visualized. Embryo:  Visualized. Cardiac Activity: Visualized. Heart Rate: 144 bpm CRL: 58.1 mm   12 w 2 d                  [place] EDC: 12/27/2018 Subchorionic hemorrhage:  None visualized. Maternal uterus/adnexae: Right ovary measures 3.3 x 2.6 x 3.4 cm. Left ovary measures 3.4 x 1.9 x 2.7 cm. No adnexal mass or fluid collection identified. No pelvic free fluid. IMPRESSION: Single live intrauterine pregnancy with estimated gestational age of [redacted] weeks and 2 days. No  acute process identified. Electronically Signed   By: Mitzi Hansen M.D.   On: 06/16/2018 22:23    MAU COURSE Orders Placed This Encounter  Procedures  . US OB Comp Less 14 Wks  . Urinalysis, Routine w reflex microscopic  . CBC with Differential/Platelet  . Comprehensive metabolic panel  . Lipase, blood  . Discharge patient   Meds ordered this encounter  Medications  . simethicone (MYLICON) chewable tablet 80 mg  . promethazine (PHENERGAN) injection 12.5 mg  . butalbital-acetaminophen-caffeine (FIORICET, ESGIC) 50-325-40 MG per tablet 2 tablet  . metoCLOPramide (REGLAN) 10 MG tablet    Sig: Take 1 tablet (10 mg total) by mouth every 6 (six) hours.    Dispense:  30 tablet    Refill:  0    Order Specific Question:   Supervising Provider    Answer:   Adam Phenix [[id number]]  . simethicone (MYLICON) 80 MG chewable tablet    Sig: Chew 1 tablet (80 mg total) by mouth every 6 (six) hours as needed for flatulence.    Dispense:  30 tablet  Refill:  0    Order Specific Question:   Supervising Provider    Answer:   Adam Phenix [[id number]]    MDM FHR present via doppler. Cervix closed. Normal ultrasound Pt afebrile Simethicone given, pt vomited soon after IM phenergan given prior to fioricet for headache. Pt tolerated medication & reports improvement in symptoms  ASSESSMENT 1. Nausea and vomiting during pregnancy prior to [redacted] weeks gestation   2. LLQ pain   3. Headache in pregnancy, antepartum, first trimester   4. [redacted] weeks gestation of pregnancy     PLAN Discharge home in stable condition. Rx reglan & simethicone Discussed reasons to return to MAU vs ED  Allergies as of 06/16/2018   No Known Allergies     Medication List    TAKE these medications   albuterol 108 (90 Base) MCG/ACT inhaler Commonly known as:  PROVENTIL HFA;VENTOLIN HFA Inhale 2 puffs into the lungs every 4 (four) hours as needed for wheezing or shortness of breath.   metoCLOPramide 10 MG  tablet Commonly known as:  REGLAN Take 1 tablet (10 mg total) by mouth every 6 (six) hours.   prenatal multivitamin Tabs tablet Take 1 tablet by mouth daily at 12 noon.   QVAR 80 MCG/ACT inhaler Generic drug:  beclomethasone Inhale 1 puff into the lungs daily as needed.   simethicone 80 MG chewable tablet Commonly known as:  MYLICON Chew 1 tablet (80 mg total) by mouth every 6 (six) hours as needed for flatulence.        Judeth Horn, NP 06/17/2018  1:00 AM

## 2018-06-16 NOTE — MAU Note (Signed)
Pt states she has been having constant headache x 3 days, unable to keep anything down since yesterday. abd pian x 2 days.

## 2018-06-16 NOTE — MAU Note (Signed)
Urine in lab 

## 2018-06-18 LAB — HEMOGLOBINOPATHY EVALUATION
FERRITIN: 32 ng/mL (ref 15–150)
HGB C: 0 %
HGB SOLUBILITY: NEGATIVE
Hgb A2 Quant: 2.1 % (ref 1.8–3.2)
Hgb A: 97.9 % (ref 96.4–98.8)
Hgb F Quant: 0 % (ref 0.0–2.0)
Hgb S: 0 %
Hgb Variant: 0 %

## 2018-06-18 LAB — OBSTETRIC PANEL, INCLUDING HIV
ANTIBODY SCREEN: NEGATIVE
BASOS: 1 %
Basophils Absolute: 0.1 10*3/uL (ref 0.0–0.2)
EOS (ABSOLUTE): 0.1 10*3/uL (ref 0.0–0.4)
Eos: 1 %
HEMATOCRIT: 37.3 % (ref 34.0–46.6)
HEP B S AG: NEGATIVE
HIV Screen 4th Generation wRfx: NONREACTIVE
Hemoglobin: 12.6 g/dL (ref 11.1–15.9)
IMMATURE GRANS (ABS): 0 10*3/uL (ref 0.0–0.1)
Immature Granulocytes: 0 %
LYMPHS: 21 %
Lymphocytes Absolute: 1.8 10*3/uL (ref 0.7–3.1)
MCH: 31.7 pg (ref 26.6–33.0)
MCHC: 33.8 g/dL (ref 31.5–35.7)
MCV: 94 fL (ref 79–97)
MONOCYTES: 5 %
Monocytes Absolute: 0.4 10*3/uL (ref 0.1–0.9)
Neutrophils Absolute: 6.1 10*3/uL (ref 1.4–7.0)
Neutrophils: 72 %
Platelets: 285 10*3/uL (ref 150–450)
RBC: 3.98 x10E6/uL (ref 3.77–5.28)
RDW: 12.1 % — AB (ref 12.3–15.4)
RPR Ser Ql: NONREACTIVE
RUBELLA: 5.21 {index} (ref >0.99)
Rh Factor: POSITIVE
WBC: 8.3 10*3/uL (ref 3.4–10.8)

## 2018-06-18 LAB — SMN1 COPY NUMBER ANALYSIS (SMA CARRIER SCREENING)

## 2018-06-18 LAB — CYSTIC FIBROSIS GENE TEST

## 2018-06-18 LAB — HEMOGLOBIN A1C
ESTIMATED AVERAGE GLUCOSE: 105 mg/dL
Hgb A1c MFr Bld: 5.3 % (ref 4.8–5.6)

## 2018-06-21 ENCOUNTER — Telehealth: Payer: Self-pay

## 2018-06-21 NOTE — Telephone Encounter (Signed)
Pt called stating that she needs someone to call her back.  LM that we are returning her call to please give the office a call back or contact us through MyChart.

## 2018-06-22 ENCOUNTER — Encounter: Payer: Self-pay | Admitting: *Deleted

## 2018-07-02 ENCOUNTER — Encounter (HOSPITAL_COMMUNITY): Payer: Self-pay | Admitting: *Deleted

## 2018-07-02 ENCOUNTER — Other Ambulatory Visit: Payer: Self-pay

## 2018-07-02 ENCOUNTER — Inpatient Hospital Stay (HOSPITAL_COMMUNITY)
Admission: AD | Admit: 2018-07-02 | Discharge: 2018-07-02 | Disposition: A | Discharge status: Home / Self Care | Payer: Medicaid Other | Attending: Obstetrics and Gynecology | Admitting: Obstetrics and Gynecology

## 2018-07-02 DIAGNOSIS — Z7951 Long term (current) use of inhaled steroids: Secondary | ICD-10-CM | POA: Insufficient documentation

## 2018-07-02 DIAGNOSIS — F419 Anxiety disorder, unspecified: Secondary | ICD-10-CM | POA: Diagnosis not present

## 2018-07-02 DIAGNOSIS — Z3A14 14 weeks gestation of pregnancy: Secondary | ICD-10-CM | POA: Insufficient documentation

## 2018-07-02 DIAGNOSIS — Z87891 Personal history of nicotine dependence: Secondary | ICD-10-CM | POA: Diagnosis not present

## 2018-07-02 DIAGNOSIS — Z79899 Other long term (current) drug therapy: Secondary | ICD-10-CM | POA: Insufficient documentation

## 2018-07-02 DIAGNOSIS — O99342 Other mental disorders complicating pregnancy, second trimester: Secondary | ICD-10-CM | POA: Diagnosis not present

## 2018-07-02 DIAGNOSIS — O99512 Diseases of the respiratory system complicating pregnancy, second trimester: Secondary | ICD-10-CM | POA: Insufficient documentation

## 2018-07-02 DIAGNOSIS — O99212 Obesity complicating pregnancy, second trimester: Secondary | ICD-10-CM | POA: Insufficient documentation

## 2018-07-02 DIAGNOSIS — E282 Polycystic ovarian syndrome: Secondary | ICD-10-CM | POA: Diagnosis not present

## 2018-07-02 DIAGNOSIS — O99282 Endocrine, nutritional and metabolic diseases complicating pregnancy, second trimester: Secondary | ICD-10-CM | POA: Insufficient documentation

## 2018-07-02 DIAGNOSIS — O26892 Other specified pregnancy related conditions, second trimester: Secondary | ICD-10-CM | POA: Diagnosis present

## 2018-07-02 DIAGNOSIS — J45909 Unspecified asthma, uncomplicated: Secondary | ICD-10-CM | POA: Diagnosis not present

## 2018-07-02 DIAGNOSIS — R102 Pelvic and perineal pain: Secondary | ICD-10-CM | POA: Diagnosis not present

## 2018-07-02 LAB — BASIC METABOLIC PANEL
Anion gap: 6 (ref 5–15)
BUN: 7 mg/dL (ref 6–20)
CALCIUM: 8.5 mg/dL — AB (ref 8.9–10.3)
CO2: 20 mmol/L — ABNORMAL LOW (ref 22–32)
CREATININE: 0.69 mg/dL (ref 0.44–1.00)
Chloride: 106 mmol/L (ref 98–111)
GFR calc Af Amer: >60 mL/min (ref >60)
GFR calc non Af Amer: >60 mL/min (ref >60)
Glucose, Bld: 82 mg/dL (ref 70–99)
Potassium: 3.7 mmol/L (ref 3.5–5.1)
Sodium: 132 mmol/L — ABNORMAL LOW (ref 135–145)

## 2018-07-02 LAB — MAGNESIUM: Magnesium: 2.2 mg/dL (ref 1.7–2.4)

## 2018-07-02 MED ORDER — CYCLOBENZAPRINE HCL 10 MG PO TABS
10.0000 mg | ORAL_TABLET | Freq: Once | ORAL | Status: AC
  Administered 2018-07-02: 10 mg via ORAL
  Filled 2018-07-02: qty 1

## 2018-07-02 NOTE — MAU Provider Note (Addendum)
History     CSN: [id number]  Arrival date and time: 07/02/18 1342   First Provider Initiated Contact with Patient 07/02/18 1420      Chief Complaint  Patient presents with  . muscle cramps   Jodi Daniel is 22 y.o. [id number] at 3272w1d presenting with severe leg pains/cramping. She reports muscle spasms and shooting pains down both legs. She denies abdominal pain, vaginal bleeding, or discharge.   Past Medical History:  Diagnosis Date  . Anxiety   . Asthma   . BV (bacterial vaginosis)   . Headache(784.0)   . Obese   . Panic   . PCOS (polycystic ovarian syndrome)   . UTI (lower urinary tract infection)     Past Surgical History:  Procedure Laterality Date  . CESAREAN SECTION    . FRACTURE SURGERY     thumb  . HERNIA REPAIR      Family History  Problem Relation Age of Onset  . Diabetes Mother   . Migraines Mother   . Hypertension Mother   . Heart murmur Brother   . Cancer Paternal Grandfather     Social History   Tobacco Use  . Smoking status: Former Smoker    Types: Cigars  . Smokeless tobacco: Never Used  Substance Use Topics  . Alcohol use: Not Currently    Frequency: Never  . Drug use: No    Allergies: No Known Allergies  Medications Prior to Admission  Medication Sig Dispense Refill Last Dose  . albuterol (PROVENTIL HFA;VENTOLIN HFA) 108 (90 BASE) MCG/ACT inhaler Inhale 2 puffs into the lungs every 4 (four) hours as needed for wheezing or shortness of breath. 1 Inhaler 2 Past Week at Unknown time  . beclomethasone (QVAR) 80 MCG/ACT inhaler Inhale 1 puff into the lungs daily as needed.    Past Week at Unknown time  . metoCLOPramide (REGLAN) 10 MG tablet Take 1 tablet (10 mg total) by mouth every 6 (six) hours. 30 tablet 0   . Prenatal Vit-Fe Fumarate-FA (PRENATAL MULTIVITAMIN) TABS tablet Take 1 tablet by mouth daily at 12 noon.   06/15/2018 at Unknown time  . simethicone (MYLICON) 80 MG chewable tablet Chew 1 tablet (80 mg total) by mouth every 6  (six) hours as needed for flatulence. 30 tablet 0     Review of Systems  Constitutional: Negative.   HENT: Negative.   Eyes: Negative.   Respiratory: Negative.   Cardiovascular: Negative.   Gastrointestinal: Positive for constipation (occasional) and nausea (treated well with zofran).  Endocrine: Negative.   Genitourinary: Negative.   Musculoskeletal: Positive for arthralgias (in her pelvis) and myalgias (severe in both legs). Negative for gait problem and joint swelling.  Skin: Negative.   Allergic/Immunologic: Negative.   Neurological: Positive for dizziness and headaches (related to pain). Negative for weakness.  Hematological: Negative.   Psychiatric/Behavioral: Negative.    Physical Exam   Blood pressure 126/74, pulse 85, temperature 98.4 F (36.9 C), temperature source Oral, resp. rate 18, weight 86.8 kg, last menstrual period 03/09/2018.  Physical Exam  Constitutional: She is oriented to person, place, and time. She appears well-developed and well-nourished. She appears distressed (visibly uncomfortable).  HENT:  Head: Normocephalic.  Eyes: Pupils are equal, round, and reactive to light.  Neck: Normal range of motion.  Cardiovascular: Normal rate, regular rhythm and normal heart sounds.  Respiratory: Effort normal and breath sounds normal.  GI: Soft. Bowel sounds are normal. She exhibits no distension. There is no abdominal tenderness.  Musculoskeletal: Normal range of  motion.     Comments: Muscle and nerve pain in legs, describes shooting pain originating in right anterior pelvis, affecting her labia and entire right leg   Neurological: She is alert and oriented to person, place, and time.  Skin: Skin is warm and dry. She is not diaphoretic.  Psychiatric: She has a normal mood and affect. Her behavior is normal. Judgment and thought content normal.    MAU Course  Procedures  MDM BMP Magnesium Level Flexeril 10 mg po -- pain resolved  FHTs by doppler: 157  bpm  Results for orders placed or performed during the hospital encounter of 07/02/18 (from the past 24 hour(s))  Basic metabolic panel     Status: Abnormal   Collection Time: 07/02/18  3:03 PM  Result Value Ref Range   Sodium 132 (L) 135 - 145 mmol/L   Potassium 3.7 3.5 - 5.1 mmol/L   Chloride 106 98 - 111 mmol/L   CO2 20 (L) 22 - 32 mmol/L   Glucose, Bld 82 70 - 99 mg/dL   BUN 7 6 - 20 mg/dL   Creatinine, Ser 1.320.69 0.44 - 1.00 mg/dL   Calcium 8.5 (L) 8.9 - 10.3 mg/dL   GFR calc non Af Amer >60 >60 mL/min   GFR calc Af Amer >60 >60 mL/min   Anion gap 6 5 - 15  Magnesium     Status: None   Collection Time: 07/02/18  3:03 PM  Result Value Ref Range   Magnesium 2.2 1.7 - 2.4 mg/dL     Bernerd LimboJamilla R Walker 44/01/027212/20/2019, 2:51 PM Assessment and Plan  Pelvic girdle pain - F/U with  Chiropractor  - Keep scheduled appt with [facility]  - Encouraged to return here or to other Urgent Care/ED if she develops worsening of symptoms, increase in pain, fever, or other concerning symptoms.     Raelyn MoraRolitta Kera Deacon, CNM  07/02/2018 5:00 PM

## 2018-07-02 NOTE — MAU Note (Signed)
Charlie horses in left leg.  Been eating bananas and drinking water, nothing seems to be helping.  Having pains going down rt leg, ? Round ligament.  Hurts worse when sitting.

## 2018-07-02 NOTE — MAU Note (Signed)
Urine in lab 

## 2018-07-05 ENCOUNTER — Encounter: Payer: Self-pay | Admitting: Advanced Practice Midwife

## 2018-07-12 ENCOUNTER — Telehealth: Payer: Self-pay | Admitting: *Deleted

## 2018-07-12 ENCOUNTER — Telehealth: Payer: Self-pay

## 2018-07-12 NOTE — Telephone Encounter (Signed)
Called pt to follow up about pain she was having, no answer, left VM for her to call us back.

## 2018-07-12 NOTE — Telephone Encounter (Signed)
Mychart message sent in regards to pt's call and voicemail since [facility] was unable to reach pt via telephone.

## 2018-07-12 NOTE — Telephone Encounter (Signed)
Pt left message VM yesterday while office was closed. She was crying and stated that she was having bad pain as well could not sleep. She requested a call back.
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
## 2018-07-16 ENCOUNTER — Ambulatory Visit (INDEPENDENT_AMBULATORY_CARE_PROVIDER_SITE_OTHER): Payer: Medicaid Other | Admitting: Clinical

## 2018-07-16 ENCOUNTER — Ambulatory Visit (INDEPENDENT_AMBULATORY_CARE_PROVIDER_SITE_OTHER): Payer: Medicaid Other | Admitting: Student

## 2018-07-16 VITALS — BP 109/62 | HR 88 | Wt 197.0 lb

## 2018-07-16 DIAGNOSIS — J45909 Unspecified asthma, uncomplicated: Secondary | ICD-10-CM

## 2018-07-16 DIAGNOSIS — Z98891 History of uterine scar from previous surgery: Secondary | ICD-10-CM

## 2018-07-16 DIAGNOSIS — M549 Dorsalgia, unspecified: Secondary | ICD-10-CM

## 2018-07-16 DIAGNOSIS — O0990 Supervision of high risk pregnancy, unspecified, unspecified trimester: Secondary | ICD-10-CM

## 2018-07-16 DIAGNOSIS — F4323 Adjustment disorder with mixed anxiety and depressed mood: Secondary | ICD-10-CM | POA: Diagnosis not present

## 2018-07-16 DIAGNOSIS — O9989 Other specified diseases and conditions complicating pregnancy, childbirth and the puerperium: Secondary | ICD-10-CM

## 2018-07-16 DIAGNOSIS — O0992 Supervision of high risk pregnancy, unspecified, second trimester: Secondary | ICD-10-CM

## 2018-07-16 DIAGNOSIS — F32A Depression, unspecified: Secondary | ICD-10-CM | POA: Insufficient documentation

## 2018-07-16 DIAGNOSIS — F419 Anxiety disorder, unspecified: Secondary | ICD-10-CM

## 2018-07-16 DIAGNOSIS — O99891 Other specified diseases and conditions complicating pregnancy: Secondary | ICD-10-CM

## 2018-07-16 MED ORDER — BECLOMETHASONE DIPROP HFA 80 MCG/ACT IN AERB: 2.0000 | INHALATION_SPRAY | Freq: Two times a day (BID) | RESPIRATORY_TRACT | 1 refills | Status: DC | PRN

## 2018-07-16 NOTE — BH Specialist Note (Signed)
Integrated Behavioral Health Follow Up Visit  MRN: [id number] Name: Jodi Daniel  Number of Integrated Behavioral Health Clinician visits: 2/6 Session Start time: 10:05  Session End time: 10:28 Total time: 20 minutes  Type of Service: Integrated Behavioral Health- Individual/Family Interpretor:No. Interpretor Name and Language: n/a  SUBJECTIVE: Jodi Daniel is a 23 y.o. female accompanied by Partner/Significant Other Patient was referred by Judeth HornErin Lawrence, NP for depression. Patient reports the following symptoms/concerns: Pt states her primary concern today is feeling more anxious and depressed during this pregnancy, with nightmares at night; she was treated via therapy in the past, and used self-coping strategies, and would like to prevent future episodes.  Duration of problem: Current pregnancy; Severity of problem: moderate  OBJECTIVE: Mood: Anxious and Affect: Appropriate Risk of harm to self or others: No plan to harm self or others  LIFE CONTEXT: Family and Social: - School/Work: - Self-Care: - Life Changes: Current pregnancy  GOALS ADDRESSED: Patient will: 1.  Reduce symptoms of: anxiety, depression and stress  2.  Increase knowledge and/or ability of: healthy habits  3.  Demonstrate ability to: Increase healthy adjustment to current life circumstances  INTERVENTIONS: Interventions utilized:  Motivational Interviewing and Psychoeducation and/or Health Education Standardized Assessments completed: GAD-7 and PHQ 9  ASSESSMENT: Patient currently experiencing Adjustment disorder with mixed anxious and depressed mood.   Patient may benefit from psychoeducation and brief therapeutic interventions regarding coping with symptoms of anxiety and depression .  PLAN: 1. Follow up with behavioral health clinician on : One week (will do relaxation breathing at that time) 2. Behavioral recommendations:  -Begin using sleep app tonight; continue for at least one  week -Consider obtaining body pillow this week -Keep track of caffeine intake for one week 3. Referral(s[facility] 4. "From scale of 1-10, how likely are you to follow plan?": 10  Rae LipsJamie C Kennetha Pearman, LCSW  [facility] 06/08/2018  Decreased Interest 2  Down, Depressed, Hopeless 1  PHQ - 2 Score 3  Altered sleeping 2  Tired, decreased energy 2  Change in appetite 1  Feeling bad or failure about yourself  0  Trouble concentrating 1  Moving slowly or fidgety/restless 0  Suicidal thoughts 0  PHQ-9 Score 9   GAD 7 : Generalized Anxiety Score 06/08/2018  Nervous, Anxious, on Edge 2  Control/stop worrying 0  Worry too much - different things 1  Trouble relaxing 0  Restless 0  Easily annoyed or irritable 3  Afraid - awful might happen 0  Total GAD 7 Score 6

## 2018-07-16 NOTE — Progress Notes (Signed)
[facility] Pt will be contacted when medication arrives.

## 2018-07-16 NOTE — Progress Notes (Signed)
   PRENATAL VISIT NOTE  Subjective:  Jodi Daniel is a 23 y.o. [phone] at [redacted]w[redacted]d being seen today for ongoing prenatal care.  She is currently monitored for the following issues for this high-risk pregnancy and has a History of recurrent miscarriages; Preterm delivery; History of C-section; Placental abruption; and Anxiety on their problem list.  Patient denies pregnancy related complaints including contractions, vaginal bleeding, abdominal cramping, and endorses fetal movement. However, Jodi Daniel does complain of back pain that started about two weeks ago and has been persistent.  She has not attempted to take any medications for this condition and was seen in the MAU with instructions to seek out chiropractic care, which was never completed.  However, patient opts to start physical therapy.  Patient also reports frequent "asthma attacks" and has been using her albuterol inhaler every 2 hours.  She reports that she lost her Qvar inhaler and felt her symptoms were managed well on this method.  Jodi Daniel also endorses some anxiety related to her asthma symptoms and attacks and is accepting of a behavioral health referral today.    The following portions of the patient's history were reviewed and updated as appropriate: allergies, current medications, past family history, past medical history, past social history, past surgical history and problem list. Problem list updated.  Objective:   Vitals:   07/16/18 0933  BP: 109/62  Pulse: 88  Weight: 197 lb (89.4 kg)    Fetal Status: Fetal Heart Rate (bpm): 146   Movement: Present     General:  Alert, oriented and cooperative. Patient is in no acute distress.  Skin: Skin is warm and dry. No rash noted.   Cardiovascular: Normal heart rate noted  Respiratory: Normal respiratory effort, no problems with respiration noted  Abdomen: Soft, gravid, appropriate for gestational age.  Pain/Pressure: Present     Pelvic: Cervical exam deferred         Extremities: Normal range of motion.  Edema: None  Mental Status: Normal mood and affect. Normal behavior. Normal judgment and thought content.   Assessment and Plan:  Pregnancy: [id number] at [redacted]w[redacted]d  1. Supervision of high risk pregnancy, antepartum - [place] MFM OB DETAIL +14 WK; Future   2. History of C-section -Patient desires a TOLAC. -R/B Discussed but papers not signed  3. Anxiety - Ambulatory referral to Integrated Behavioral Health  4. Moderate asthma, unspecified whether complicated, unspecified whether persistent -Discussed usage of various inhalers for regular vs rescue dosing -Rx for Qvar to take Q12hrs -Discussed changing frequency if unresponsive  -Educated on usage of albuterol; Q4-6 hrs prn   5. Preterm delivery -Patient offered and accepts 17p injections. -Will call and schedule when medication arrives for first injection  6. Back pain affecting pregnancy in second trimester -Referral to physical therapy to be sent -Offered and declined pharmacological methods  Preterm labor symptoms and general obstetric precautions including but not limited to vaginal bleeding, contractions, leaking of fluid and fetal movement were reviewed in detail with the patient. Please refer to After Visit Summary for other counseling recommendations.   Return in about 4 weeks (around 08/13/2018) for ROB.  Future Appointments  Date Time Provider Department Center  07/16/2018 10:30 AM [facility]ALTH CLINICIAN [facility] WOC  07/29/2018  2:15 PM [facility] [place] 4 [facility] M[facility]-US    Cherre Robins, CNM

## 2018-07-19 ENCOUNTER — Encounter (HOSPITAL_COMMUNITY): Payer: Self-pay | Admitting: *Deleted

## 2018-07-19 ENCOUNTER — Inpatient Hospital Stay (HOSPITAL_COMMUNITY)
Admission: AD | Admit: 2018-07-19 | Discharge: 2018-07-19 | Disposition: A | Discharge status: Home / Self Care | Payer: Medicaid Other | Attending: Family Medicine | Admitting: Family Medicine

## 2018-07-19 ENCOUNTER — Other Ambulatory Visit: Payer: Self-pay

## 2018-07-19 DIAGNOSIS — O26892 Other specified pregnancy related conditions, second trimester: Secondary | ICD-10-CM | POA: Diagnosis not present

## 2018-07-19 DIAGNOSIS — O0992 Supervision of high risk pregnancy, unspecified, second trimester: Secondary | ICD-10-CM | POA: Diagnosis not present

## 2018-07-19 DIAGNOSIS — O0990 Supervision of high risk pregnancy, unspecified, unspecified trimester: Secondary | ICD-10-CM

## 2018-07-19 DIAGNOSIS — O9A311 Physical abuse complicating pregnancy, first trimester: Secondary | ICD-10-CM | POA: Insufficient documentation

## 2018-07-19 DIAGNOSIS — O9A219 Injury, poisoning and certain other consequences of external causes complicating pregnancy, unspecified trimester: Secondary | ICD-10-CM

## 2018-07-19 DIAGNOSIS — O9A212 Injury, poisoning and certain other consequences of external causes complicating pregnancy, second trimester: Secondary | ICD-10-CM | POA: Diagnosis not present

## 2018-07-19 DIAGNOSIS — Z3201 Encounter for pregnancy test, result positive: Secondary | ICD-10-CM

## 2018-07-19 DIAGNOSIS — Z3A16 16 weeks gestation of pregnancy: Secondary | ICD-10-CM | POA: Insufficient documentation

## 2018-07-19 DIAGNOSIS — T7491XA Unspecified adult maltreatment, confirmed, initial encounter: Secondary | ICD-10-CM

## 2018-07-19 HISTORY — DX: Major depressive disorder, single episode, unspecified: F32.9

## 2018-07-19 HISTORY — DX: Depression, unspecified: F32.A

## 2018-07-19 HISTORY — DX: Unspecified fracture of skull, initial encounter for closed fracture: S02.91XA

## 2018-07-19 MED ORDER — ACETAMINOPHEN 500 MG PO TABS: 1000.0000 mg | ORAL_TABLET | Freq: Once | ORAL | Status: DC

## 2018-07-19 MED ORDER — LACTATED RINGERS IV SOLN: INTRAVENOUS | Status: DC

## 2018-07-19 MED ORDER — ACETAMINOPHEN 500 MG PO TABS
1000.0000 mg | ORAL_TABLET | Freq: Once | ORAL | Status: AC
  Administered 2018-07-19: 1000 mg via ORAL
  Filled 2018-07-19: qty 2

## 2018-07-19 NOTE — MAU Note (Signed)
Has made arrangements with family, "exit plan"

## 2018-07-19 NOTE — Discharge Instructions (Signed)
Domestic Violence and Pregnancy °Domestic violence is any type of physical, sexual, or emotional harm done by a current or former partner. Emotional abuse includes threatening and controlling behavior. Stalking is an example of threatening behavior. This type of abuse can happen to anyone, at any time in a relationship. Domestic violence is also called intimate partner violence. °Intimate partner violence is especially dangerous during pregnancy because the abuse may affect both you and your developing baby. Call a domestic violence hotline or let your health care provider know if you are being physically or sexually abused, or if your partner's threatening or controlling behavior is making you feel unsafe. Getting help and support protects you, your pregnancy, and your baby. °How does this affect me? °For you, the negative effects of intimate partner violence can include: °· Physical injury or death (homicide). °· Emotional stress and fear. °· Trouble sleeping. °· Loss of appetite. °· Digestion problems. °· Frequent infections, including sexually transmitted infections. °· High blood pressure. °· Depression. °· Anxiety. °· Thoughts of hurting yourself or committing suicide. °Intimate partner violence may negatively affect your pregnancy in these ways: °· You are more likely to be injured or have poor health. °· You may be less likely to get prenatal care. °· You may not have good nutrition or gain a healthy amount of weight. °· You may be more likely to smoke, use drugs, and drink alcohol during pregnancy to relieve stress. °· You may be at higher risk of losing your pregnancy (miscarriage or stillbirth). °· Your baby may be born before 37 weeks of pregnancy (premature). °How does this affect my baby? °If you experience intimate partner violence during pregnancy: °· Your baby may be injured. °· Your baby may not survive pregnancy (miscarriage or stillbirth). °· Your baby may be born premature, which can cause  mental and physical problems. °· Your baby may not grow well in your womb and may be born small (small for gestational age). °· If you use alcohol, your baby may be born with fetal alcohol syndrome, which can cause birth defects and other problems. °· You may have trouble bonding with your baby, which can lead to neglect. °· You may be less likely to breastfeed, which is the healthiest way to feed your baby. °Follow these instructions at home: ° °· Let your health care provider know that you are experiencing intimate partner violence. °· Do not bring an abusive partner with you to your prenatal visits. This will allow you to speak freely with your provider. °· Learn about and use resources on intimate partner violence, such as the National Domestic Violence Hotline: thehotline.org. °· Consider counseling. °· Consider getting a legal document that says your partner has to stay away from you (restraining order). °· Have a support person stay with you in your home. °· Have an escape plan to get to a safe place. °· Do not smoke,use drugs, or drink alcohol to relieve stress. °· Keep all your prenatal visits as told by your health care provider. This is important. °Where to find more information °· Futures Without Violence: futureswithoutviolence.org °· National Coalition Against Domestic Violence: ncadv.org °· National Network to End Domestic Violence: nnedv.org °· National Resource Center on Domestic Violence: nrcdv.org °· The U.S. Department of Justice, Office on Violence Against Women: justice.gov/ovw °Contact a health care provider if: °· You experience any type of intimate partner violence at home. °· You need help to quit smoking, drinking, or taking drugs. °Get help right away if: °· You   do not feel safe at home.  You have thoughts of hurting yourself or committing suicide. If you ever feel like you may hurt yourself or others, or have thoughts about taking your own life, get help right away. You can go to your  nearest emergency department or call:  Your local emergency services (911 in the [place]  A suicide crisis helpline, such as the National Suicide Prevention Lifeline at 9797385923. This is open 24 hours a day. Summary  Domestic violence is also called intimate partner violence. Intimate partner violence can be physical, sexual, or emotional.  This type of abuse can happen to anyone at any time in a relationship, but the effects are especially dangerous during pregnancy.  Intimate partner violence increases your baby's risk of miscarriage, stillbirth, and premature birth.  Tell your health care provider if you experience any type of intimate partner violence. Get help right away if you do not feel safe at home. This information is not intended to replace advice given to you by your health care provider. Make sure you discuss any questions you have with your health care provider. Document Released: 03/18/2017 Document Revised: 03/18/2017 Document Reviewed: 03/18/2017 Elsevier Interactive Patient Education  2019 [employer]. Second Trimester of Pregnancy  The second trimester is from week 14 through week 27 (month 4 through 6). This is often the time in pregnancy that you feel your best. Often times, morning sickness has lessened or quit. You may have more energy, and you may get hungry more often. Your unborn baby is growing rapidly. At the end of the sixth month, he or she is about 9 inches long and weighs about 1 pounds. You will likely feel the baby move between 18 and 20 weeks of pregnancy. Follow these instructions at home: Medicines  Take over-the-counter and prescription medicines only as told by your doctor. Some medicines are safe and some medicines are not safe during pregnancy.  Take a prenatal vitamin that contains at least 600 micrograms (mcg) of folic acid.  If you have trouble pooping (constipation), take medicine that will make your stool soft (stool softener) if your  doctor approves. Eating and drinking   Eat regular, healthy meals.  Avoid raw meat and uncooked cheese.  If you get low calcium from the food you eat, talk to your doctor about taking a daily calcium supplement.  Avoid foods that are high in fat and sugars, such as fried and sweet foods.  If you feel sick to your stomach (nauseous) or throw up (vomit): ? Eat 4 or 5 small meals a day instead of 3 large meals. ? Try eating a few soda crackers. ? Drink liquids between meals instead of during meals.  To prevent constipation: ? Eat foods that are high in fiber, like fresh fruits and vegetables, whole grains, and beans. ? Drink enough fluids to keep your pee (urine) clear or pale yellow. Activity  Exercise only as told by your doctor. Stop exercising if you start to have cramps.  Do not exercise if it is too hot, too humid, or if you are in a place of great height (high altitude).  Avoid heavy lifting.  Wear low-heeled shoes. Sit and stand up straight.  You can continue to have sex unless your doctor tells you not to. Relieving pain and discomfort  Wear a good support bra if your breasts are tender.  Take warm water baths (sitz baths) to soothe pain or discomfort caused by hemorrhoids. Use hemorrhoid cream if your doctor  approves.  Rest with your legs raised if you have leg cramps or low back pain.  If you develop puffy, bulging veins (varicose veins) in your legs: ? Wear support hose or compression stockings as told by your doctor. ? Raise (elevate) your feet for 15 minutes, 3-4 times a day. ? Limit salt in your food. Prenatal care  Write down your questions. Take them to your prenatal visits.  Keep all your prenatal visits as told by your doctor. This is important. Safety  Wear your seat belt when driving.  Make a list of emergency phone numbers, including numbers for family, friends, the hospital, and police and fire departments. General instructions  Ask your  doctor about the right foods to eat or for help finding a counselor, if you need these services.  Ask your doctor about local prenatal classes. Begin classes before month 6 of your pregnancy.  Do not use hot tubs, steam rooms, or saunas.  Do not douche or use tampons or scented sanitary pads.  Do not cross your legs for long periods of time.  Visit your dentist if you have not done so. Use a soft toothbrush to brush your teeth. Floss gently.  Avoid all smoking, herbs, and alcohol. Avoid drugs that are not approved by your doctor.  Do not use any products that contain nicotine or tobacco, such as cigarettes and e-cigarettes. If you need help quitting, ask your doctor.  Avoid cat litter boxes and soil used by cats. These carry germs that can cause birth defects in the baby and can cause a loss of your baby (miscarriage) or stillbirth. Contact a doctor if:  You have mild cramps or pressure in your lower belly.  You have pain when you pee (urinate).  You have bad smelling fluid coming from your vagina.  You continue to feel sick to your stomach (nauseous), throw up (vomit), or have watery poop (diarrhea).  You have a nagging pain in your belly area.  You feel dizzy. Get help right away if:  You have a fever.  You are leaking fluid from your vagina.  You have spotting or bleeding from your vagina.  You have severe belly cramping or pain.  You lose or gain weight rapidly.  You have trouble catching your breath and have chest pain.  You notice sudden or extreme puffiness (swelling) of your face, hands, ankles, feet, or legs.  You have not felt the baby move in over an hour.  You have severe headaches that do not go away when you take medicine.  You have trouble seeing. Summary  The second trimester is from week 14 through week 27 (months 4 through 6). This is often the time in pregnancy that you feel your best.  To take care of yourself and your unborn baby, you will  need to eat healthy meals, take medicines only if your doctor tells you to do so, and do activities that are safe for you and your baby.  Call your doctor if you get sick or if you notice anything unusual about your pregnancy. Also, call your doctor if you need help with the right food to eat, or if you want to know what activities are safe for you. This information is not intended to replace advice given to you by your health care provider. Make sure you discuss any questions you have with your health care provider. Document Released: 09/24/2009 Document Revised: 08/05/2016 Document Reviewed: 08/05/2016 Elsevier Interactive Patient Education  2019 [employer].

## 2018-07-19 NOTE — MAU Note (Signed)
Security and CSI in with pt
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
## 2018-07-22 ENCOUNTER — Ambulatory Visit: Payer: Medicaid Other

## 2018-07-22 ENCOUNTER — Encounter (HOSPITAL_COMMUNITY): Payer: Self-pay

## 2018-07-22 ENCOUNTER — Ambulatory Visit (INDEPENDENT_AMBULATORY_CARE_PROVIDER_SITE_OTHER): Payer: Medicaid Other | Admitting: General Practice

## 2018-07-22 DIAGNOSIS — O09212 Supervision of pregnancy with history of pre-term labor, second trimester: Secondary | ICD-10-CM | POA: Diagnosis not present

## 2018-07-22 MED ORDER — HYDROXYPROGESTERONE CAPROATE 250 MG/ML IM OIL
250.0000 mg | TOPICAL_OIL | Freq: Once | INTRAMUSCULAR | Status: AC
  Administered 2018-07-22: 250 mg via INTRAMUSCULAR

## 2018-07-22 NOTE — Progress Notes (Signed)
Truett Perna here for 17-P  Injection.  Injection administered without complication. Patient will return in one week for next injection.  [name], RN 07/22/2018  1:16 PM

## 2018-07-26 ENCOUNTER — Ambulatory Visit: Payer: Medicaid Other | Admitting: Physical Therapy

## 2018-07-26 ENCOUNTER — Other Ambulatory Visit: Payer: Self-pay

## 2018-07-26 ENCOUNTER — Encounter: Payer: Self-pay | Admitting: Physical Therapy

## 2018-07-26 DIAGNOSIS — M6281 Muscle weakness (generalized): Secondary | ICD-10-CM | POA: Insufficient documentation

## 2018-07-26 DIAGNOSIS — G8929 Other chronic pain: Secondary | ICD-10-CM | POA: Diagnosis present

## 2018-07-26 DIAGNOSIS — M5441 Lumbago with sciatica, right side: Secondary | ICD-10-CM | POA: Insufficient documentation

## 2018-07-26 NOTE — Patient Instructions (Signed)
Standing backbends x10 reps, Education on centralizing symptoms Quadruped lumbar extension, 5x10 sec holds Needs medbridge code/handout, system down
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
## 2018-07-29 ENCOUNTER — Ambulatory Visit (HOSPITAL_COMMUNITY)
Admission: RE | Admit: 2018-07-29 | Discharge: 2018-07-29 | Disposition: A | Discharge status: Home / Self Care | Payer: Medicaid Other | Source: Ambulatory Visit | Attending: Student | Admitting: Student

## 2018-07-29 ENCOUNTER — Encounter (HOSPITAL_COMMUNITY): Payer: Self-pay

## 2018-07-29 ENCOUNTER — Ambulatory Visit (INDEPENDENT_AMBULATORY_CARE_PROVIDER_SITE_OTHER): Payer: Medicaid Other | Admitting: General Practice

## 2018-07-29 DIAGNOSIS — O09293 Supervision of pregnancy with other poor reproductive or obstetric history, third trimester: Secondary | ICD-10-CM

## 2018-07-29 DIAGNOSIS — Z3A18 18 weeks gestation of pregnancy: Secondary | ICD-10-CM

## 2018-07-29 DIAGNOSIS — O09213 Supervision of pregnancy with history of pre-term labor, third trimester: Secondary | ICD-10-CM

## 2018-07-29 DIAGNOSIS — Z363 Encounter for antenatal screening for malformations: Secondary | ICD-10-CM | POA: Diagnosis not present

## 2018-07-29 DIAGNOSIS — O34219 Maternal care for unspecified type scar from previous cesarean delivery: Secondary | ICD-10-CM | POA: Diagnosis not present

## 2018-07-29 DIAGNOSIS — Z3201 Encounter for pregnancy test, result positive: Secondary | ICD-10-CM | POA: Diagnosis present

## 2018-07-29 DIAGNOSIS — O0990 Supervision of high risk pregnancy, unspecified, unspecified trimester: Secondary | ICD-10-CM | POA: Insufficient documentation

## 2018-07-29 MED ORDER — HYDROXYPROGESTERONE CAPROATE 250 MG/ML IM OIL
250.0000 mg | TOPICAL_OIL | INTRAMUSCULAR | Status: AC
  Administered 2018-07-29 – 2018-10-06 (×10): 250 mg via INTRAMUSCULAR

## 2018-07-29 NOTE — Progress Notes (Signed)
Jodi Daniel here for 17-P  Injection.  Injection administered without complication. Patient will return in one week for next injection.  [name], RN 07/29/2018  1:58 PM

## 2018-07-29 NOTE — Progress Notes (Signed)
I agree with the nurses note and plan of care.  Duane Lope, NP 07/29/2018 4:19 PM

## 2018-08-03 ENCOUNTER — Other Ambulatory Visit: Payer: Self-pay | Admitting: Student

## 2018-08-03 DIAGNOSIS — Z3A18 18 weeks gestation of pregnancy: Secondary | ICD-10-CM

## 2018-08-03 DIAGNOSIS — O34219 Maternal care for unspecified type scar from previous cesarean delivery: Secondary | ICD-10-CM

## 2018-08-03 DIAGNOSIS — O09293 Supervision of pregnancy with other poor reproductive or obstetric history, third trimester: Secondary | ICD-10-CM

## 2018-08-03 DIAGNOSIS — Z363 Encounter for antenatal screening for malformations: Secondary | ICD-10-CM

## 2018-08-03 DIAGNOSIS — O09213 Supervision of pregnancy with history of pre-term labor, third trimester: Secondary | ICD-10-CM

## 2018-08-05 ENCOUNTER — Ambulatory Visit (INDEPENDENT_AMBULATORY_CARE_PROVIDER_SITE_OTHER): Payer: Medicaid Other | Admitting: *Deleted

## 2018-08-05 ENCOUNTER — Ambulatory Visit: Payer: Medicaid Other | Admitting: Physical Therapy

## 2018-08-05 ENCOUNTER — Encounter: Payer: Self-pay | Admitting: Physical Therapy

## 2018-08-05 VITALS — BP 118/72 | HR 91 | Ht 69.0 in | Wt 197.9 lb

## 2018-08-05 DIAGNOSIS — Z8751 Personal history of pre-term labor: Secondary | ICD-10-CM

## 2018-08-05 DIAGNOSIS — O09212 Supervision of pregnancy with history of pre-term labor, second trimester: Secondary | ICD-10-CM

## 2018-08-05 DIAGNOSIS — M5441 Lumbago with sciatica, right side: Secondary | ICD-10-CM | POA: Diagnosis not present

## 2018-08-05 DIAGNOSIS — M6281 Muscle weakness (generalized): Secondary | ICD-10-CM

## 2018-08-05 DIAGNOSIS — G8929 Other chronic pain: Secondary | ICD-10-CM

## 2018-08-05 NOTE — Patient Instructions (Signed)
Access Code: [email or web address]  URL: [email or web address]  Date: 08/05/2018  Prepared by: Lavinia Sharps   Exercises  Standing Lumbar Extension with Counter - 10 reps - 1 sets - 1x daily - 7x weekly  Standing Lumbar Extension at Wall - Forearms - 10 reps - 1 sets - 1x daily - 7x weekly  Quadruped Cat Camel - 10 reps - 1 sets - 1x daily - 7x weekly  Quadruped Abdominal and Pelvic Brace - 10 reps - 1 sets - 1x daily - 7x weekly

## 2018-08-05 NOTE — Progress Notes (Addendum)
Pt is concerned about weight gain (approximately 30 lbs) - encouraged to discuss w/Dr. Vergie Living @ next visit 1/30.  17P 250 mg IM administered as scheduled.  Pt tolerated well. Medication refill requested from [facility] - 1 dose remaining in cabinet.
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
## 2018-08-08 NOTE — Progress Notes (Signed)
I have reviewed the chart and agree with nursing staff's documentation of this patient's encounter.  Gayl Ivanoff, MD 08/08/2018 11:17 PM    

## 2018-08-11 ENCOUNTER — Ambulatory Visit: Payer: Medicaid Other | Admitting: Physical Therapy

## 2018-08-12 ENCOUNTER — Ambulatory Visit (INDEPENDENT_AMBULATORY_CARE_PROVIDER_SITE_OTHER): Payer: Medicaid Other | Admitting: Obstetrics and Gynecology

## 2018-08-12 ENCOUNTER — Encounter: Payer: Self-pay | Admitting: Obstetrics and Gynecology

## 2018-08-12 ENCOUNTER — Ambulatory Visit: Payer: Medicaid Other | Admitting: Physical Therapy

## 2018-08-12 VITALS — BP 114/65 | HR 86 | Wt 195.7 lb

## 2018-08-12 DIAGNOSIS — G8929 Other chronic pain: Secondary | ICD-10-CM

## 2018-08-12 DIAGNOSIS — O0992 Supervision of high risk pregnancy, unspecified, second trimester: Secondary | ICD-10-CM

## 2018-08-12 DIAGNOSIS — R51 Headache: Secondary | ICD-10-CM

## 2018-08-12 DIAGNOSIS — O26892 Other specified pregnancy related conditions, second trimester: Secondary | ICD-10-CM

## 2018-08-12 DIAGNOSIS — M5441 Lumbago with sciatica, right side: Principal | ICD-10-CM

## 2018-08-12 DIAGNOSIS — Z98891 History of uterine scar from previous surgery: Secondary | ICD-10-CM

## 2018-08-12 DIAGNOSIS — O0990 Supervision of high risk pregnancy, unspecified, unspecified trimester: Secondary | ICD-10-CM

## 2018-08-12 DIAGNOSIS — M6281 Muscle weakness (generalized): Secondary | ICD-10-CM

## 2018-08-12 DIAGNOSIS — O26899 Other specified pregnancy related conditions, unspecified trimester: Secondary | ICD-10-CM

## 2018-08-12 MED ORDER — MAGNESIUM MALATE 1250 (141.7 MG) MG PO TABS: 1.0000 | ORAL_TABLET | Freq: Every day | ORAL | 3 refills | Status: DC | PRN

## 2018-08-12 MED ORDER — RANITIDINE HCL 150 MG PO TABS: 150.0000 mg | ORAL_TABLET | Freq: Two times a day (BID) | ORAL | 3 refills | Status: DC

## 2018-08-12 NOTE — Progress Notes (Signed)
Still having problems with acid reflux, having headaches for 2 weeks now.Taking Tylenol but doesn't help anymore.

## 2018-08-12 NOTE — Progress Notes (Signed)
Prenatal Visit Note Date: 08/12/2018 Clinic: [facility]  Subjective:  Jodi Daniel is a 23 y.o. [phone] at [redacted]w[redacted]d being seen today for ongoing prenatal care.  She is currently monitored for the following issues for this high-risk pregnancy and has Supervision of high risk pregnancy, antepartum; History of recurrent miscarriages; Preterm delivery; History of C-section; History of placenta abruption; Anxiety; and Headache in pregnancy, antepartum on their problem list.  Patient reports see below.   Contractions: Regular. Vag. Bleeding: None.  Movement: Present. Denies leaking of fluid.   The following portions of the patient's history were reviewed and updated as appropriate: allergies, current medications, past family history, past medical history, past social history, past surgical history and problem list. Problem list updated.  Objective:   Vitals:   08/12/18 1409  BP: 114/65  Pulse: 86  Weight: 195 lb 11.2 oz (88.8 kg)    Fetal Status: Fetal Heart Rate (bpm): 149   Movement: Present     General:  Alert, oriented and cooperative. Patient is in no acute distress.  Skin: Skin is warm and dry. No rash noted.   Cardiovascular: Normal heart rate noted  Respiratory: Normal respiratory effort, no problems with respiration noted  Abdomen: Soft, gravid, appropriate for gestational age. Pain/Pressure: Present     Pelvic:  Cervical exam deferred        Extremities: Normal range of motion.  Edema: Trace  Mental Status: Normal mood and affect. Normal behavior. Normal judgment and thought content.  Neuro: 5/5 strength diffusely and normal sensation, normal gait, EOMI, PERRL, 1+ brachial and patellar  Urinalysis:      Assessment and Plan:  Pregnancy: [id number] at [redacted]w[redacted]d  1. Supervision of high risk pregnancy, antepartum Routine care, anatomy u/s negative.   2. Preterm delivery Continue 17p qwk  3. History of C-section Will have her sign ROI to get records from  [place]  4. Headache in pregnancy, antepartum H/o concusssion and had migraines (right sided) outside of pregnancy like these. Magnesium PRN.   Preterm labor symptoms and general obstetric precautions including but not limited to vaginal bleeding, contractions, leaking of fluid and fetal movement were reviewed in detail with the patient. Please refer to After Visit Summary for other counseling recommendations.  Return in about 1 week (around 08/19/2018) for qwk 17p visits. 3wk rob.   [place] Bing, MD
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
## 2018-08-13 ENCOUNTER — Encounter (HOSPITAL_COMMUNITY): Payer: Self-pay

## 2018-08-13 ENCOUNTER — Other Ambulatory Visit: Payer: Self-pay

## 2018-08-13 ENCOUNTER — Inpatient Hospital Stay (HOSPITAL_COMMUNITY)
Admission: AD | Admit: 2018-08-13 | Discharge: 2018-08-13 | Disposition: A | Discharge status: Home / Self Care | Payer: Medicaid Other | Attending: Obstetrics and Gynecology | Admitting: Obstetrics and Gynecology

## 2018-08-13 DIAGNOSIS — Z3A20 20 weeks gestation of pregnancy: Secondary | ICD-10-CM | POA: Diagnosis not present

## 2018-08-13 DIAGNOSIS — R102 Pelvic and perineal pain: Secondary | ICD-10-CM | POA: Diagnosis not present

## 2018-08-13 DIAGNOSIS — R300 Dysuria: Secondary | ICD-10-CM | POA: Diagnosis present

## 2018-08-13 DIAGNOSIS — O0990 Supervision of high risk pregnancy, unspecified, unspecified trimester: Secondary | ICD-10-CM

## 2018-08-13 DIAGNOSIS — O26892 Other specified pregnancy related conditions, second trimester: Secondary | ICD-10-CM | POA: Insufficient documentation

## 2018-08-13 DIAGNOSIS — O0992 Supervision of high risk pregnancy, unspecified, second trimester: Secondary | ICD-10-CM | POA: Diagnosis not present

## 2018-08-13 HISTORY — DX: Concussion with loss of consciousness of unspecified duration, initial encounter: S06.0X9A

## 2018-08-13 HISTORY — DX: Concussion with loss of consciousness status unknown, initial encounter: S06.0XAA

## 2018-08-13 LAB — WET PREP, GENITAL
Clue Cells Wet Prep HPF POC: NONE SEEN
Sperm: NONE SEEN
Trich, Wet Prep: NONE SEEN
Yeast Wet Prep HPF POC: NONE SEEN

## 2018-08-13 LAB — URINALYSIS, ROUTINE W REFLEX MICROSCOPIC
Bilirubin Urine: NEGATIVE
Glucose, UA: NEGATIVE mg/dL
Hgb urine dipstick: NEGATIVE
Ketones, ur: NEGATIVE mg/dL
LEUKOCYTES UA: NEGATIVE
Nitrite: NEGATIVE
Protein, ur: NEGATIVE mg/dL
Specific Gravity, Urine: 1.02 (ref 1.005–1.030)
pH: 7 (ref 5.0–8.0)

## 2018-08-13 NOTE — MAU Note (Signed)
Got 17P shot.  Felt like was getting a bladder infection, bladder started hurting, hurts when she pees.   Going less often.  Now is having "sharp pains in her stuff", inside and outside, feels like it might be swollen.  Never felt like this before.
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
## 2018-08-13 NOTE — MAU Note (Signed)
Urine sent to lab 

## 2018-08-16 ENCOUNTER — Encounter: Payer: Self-pay | Admitting: Physical Therapy

## 2018-08-16 ENCOUNTER — Telehealth: Payer: Self-pay | Admitting: Physical Therapy

## 2018-08-16 ENCOUNTER — Ambulatory Visit: Payer: Medicaid Other | Admitting: Physical Therapy

## 2018-08-16 LAB — GC/CHLAMYDIA PROBE AMP (~~LOC~~) NOT AT ARMC
Chlamydia: NEGATIVE
NEISSERIA GONORRHEA: NEGATIVE

## 2018-08-16 NOTE — Telephone Encounter (Signed)
Pt was a no-show for 2:45 appointment today, 08/16/18.  LM for patient to return phone call to reschedule.  Johanna Beuhring, PT 08/16/18 3:08 PM

## 2018-08-19 ENCOUNTER — Ambulatory Visit (INDEPENDENT_AMBULATORY_CARE_PROVIDER_SITE_OTHER): Payer: Medicaid Other

## 2018-08-19 DIAGNOSIS — O09212 Supervision of pregnancy with history of pre-term labor, second trimester: Secondary | ICD-10-CM

## 2018-08-19 NOTE — Progress Notes (Signed)
Jodi Daniel here for 17-P  Injection.  Injection administered without complication. Patient will return in one week for next injection.  [name], CMA 08/19/2018  2:09 PM

## 2018-08-26 ENCOUNTER — Ambulatory Visit (INDEPENDENT_AMBULATORY_CARE_PROVIDER_SITE_OTHER): Payer: Medicaid Other | Admitting: *Deleted

## 2018-08-26 VITALS — BP 113/73 | HR 98

## 2018-08-26 DIAGNOSIS — O09219 Supervision of pregnancy with history of pre-term labor, unspecified trimester: Principal | ICD-10-CM

## 2018-08-26 DIAGNOSIS — O09899 Supervision of other high risk pregnancies, unspecified trimester: Secondary | ICD-10-CM

## 2018-08-26 DIAGNOSIS — O09212 Supervision of pregnancy with history of pre-term labor, second trimester: Secondary | ICD-10-CM

## 2018-08-26 NOTE — Progress Notes (Signed)
Truett Perna here for 17-P  Injection.  Injection administered without complication. Patient will return in one week for next injection.  Linda,RN 08/26/2018  1:50 PM

## 2018-08-26 NOTE — Patient Instructions (Signed)

## 2018-08-27 NOTE — Progress Notes (Signed)
Patient ID: Jodi Daniel, female   DOB: 11-24-1995, 23 y.o.   MRN: [id number] I have reviewed the chart and agree with nursing staff's documentation of this patient's encounter.  Scheryl Darter, MD 08/27/2018 10:28 AM

## 2018-09-01 ENCOUNTER — Encounter: Payer: Self-pay | Admitting: Obstetrics and Gynecology

## 2018-09-02 ENCOUNTER — Encounter: Payer: Self-pay | Admitting: Family Medicine

## 2018-09-02 ENCOUNTER — Ambulatory Visit (INDEPENDENT_AMBULATORY_CARE_PROVIDER_SITE_OTHER): Payer: Medicaid Other | Admitting: General Practice

## 2018-09-02 NOTE — Progress Notes (Addendum)
Truett Perna here for 17-P  Injection.  Injection administered without complication. Patient will return in one week for next injection.  [name], RN 09/02/2018  12:19 PM   Attestation of Attending Supervision of RN: Evaluation and management procedures were performed by the nurse under my supervision and collaboration.  I have reviewed the nursing note and chart, and I agree with the management and plan.  Carolyn L. Harraway-Smith, M.D., Evern Core

## 2018-09-03 ENCOUNTER — Emergency Department (HOSPITAL_COMMUNITY)
Admission: EM | Admit: 2018-09-03 | Discharge: 2018-09-03 | Disposition: A | Discharge status: Home / Self Care | Payer: Medicaid Other | Attending: Emergency Medicine | Admitting: Emergency Medicine

## 2018-09-03 ENCOUNTER — Other Ambulatory Visit: Payer: Self-pay

## 2018-09-03 DIAGNOSIS — O99512 Diseases of the respiratory system complicating pregnancy, second trimester: Secondary | ICD-10-CM | POA: Diagnosis not present

## 2018-09-03 DIAGNOSIS — Z3A24 24 weeks gestation of pregnancy: Secondary | ICD-10-CM | POA: Insufficient documentation

## 2018-09-03 DIAGNOSIS — O9989 Other specified diseases and conditions complicating pregnancy, childbirth and the puerperium: Secondary | ICD-10-CM | POA: Diagnosis present

## 2018-09-03 DIAGNOSIS — Y999 Unspecified external cause status: Secondary | ICD-10-CM | POA: Diagnosis not present

## 2018-09-03 DIAGNOSIS — Y939 Activity, unspecified: Secondary | ICD-10-CM | POA: Diagnosis not present

## 2018-09-03 DIAGNOSIS — Z87891 Personal history of nicotine dependence: Secondary | ICD-10-CM | POA: Diagnosis not present

## 2018-09-03 DIAGNOSIS — O0990 Supervision of high risk pregnancy, unspecified, unspecified trimester: Secondary | ICD-10-CM

## 2018-09-03 DIAGNOSIS — Z79899 Other long term (current) drug therapy: Secondary | ICD-10-CM | POA: Insufficient documentation

## 2018-09-03 DIAGNOSIS — O368121 Decreased fetal movements, second trimester, fetus 1: Secondary | ICD-10-CM | POA: Diagnosis not present

## 2018-09-03 DIAGNOSIS — O368120 Decreased fetal movements, second trimester, not applicable or unspecified: Secondary | ICD-10-CM

## 2018-09-03 LAB — CBC WITH DIFFERENTIAL/PLATELET
Abs Immature Granulocytes: 0.04 10*3/uL (ref 0.00–0.07)
BASOS PCT: 0 %
Basophils Absolute: 0 10*3/uL (ref 0.0–0.1)
Eosinophils Absolute: 0.4 10*3/uL (ref 0.0–0.5)
Eosinophils Relative: 4 %
HCT: 36.2 % (ref 36.0–46.0)
Hemoglobin: 11.4 g/dL — ABNORMAL LOW (ref 12.0–15.0)
Immature Granulocytes: 0 %
Lymphocytes Relative: 16 %
Lymphs Abs: 1.5 10*3/uL (ref 0.7–4.0)
MCH: 30.5 pg (ref 26.0–34.0)
MCHC: 31.5 g/dL (ref 30.0–36.0)
MCV: 96.8 fL (ref 80.0–100.0)
Monocytes Absolute: 0.5 10*3/uL (ref 0.1–1.0)
Monocytes Relative: 6 %
NEUTROS PCT: 74 %
Neutro Abs: 6.5 10*3/uL (ref 1.7–7.7)
Platelets: 202 10*3/uL (ref 150–400)
RBC: 3.74 MIL/uL — ABNORMAL LOW (ref 3.87–5.11)
RDW: 12 % (ref 11.5–15.5)
WBC: 9 10*3/uL (ref 4.0–10.5)
nRBC: 0 % (ref 0.0–0.2)

## 2018-09-03 LAB — BASIC METABOLIC PANEL
Anion gap: 8 (ref 5–15)
BUN: 6 mg/dL (ref 6–20)
CO2: 19 mmol/L — AB (ref 22–32)
Calcium: 8.4 mg/dL — ABNORMAL LOW (ref 8.9–10.3)
Chloride: 107 mmol/L (ref 98–111)
Creatinine, Ser: 0.58 mg/dL (ref 0.44–1.00)
GFR calc Af Amer: >60 mL/min (ref >60)
GFR calc non Af Amer: >60 mL/min (ref >60)
Glucose, Bld: 73 mg/dL (ref 70–99)
Potassium: 3.8 mmol/L (ref 3.5–5.1)
Sodium: 134 mmol/L — ABNORMAL LOW (ref 135–145)

## 2018-09-03 NOTE — Discharge Instructions (Addendum)
If you have any pregnancy related concerns before Sunday morning please go to the [facility] hospital, if it is Sunday morning please come to new [facility] at [facility].
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
## 2018-09-03 NOTE — ED Notes (Signed)
Rapid OB Nurse notified and on their way
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
## 2018-09-09 ENCOUNTER — Other Ambulatory Visit: Payer: Self-pay

## 2018-09-09 ENCOUNTER — Ambulatory Visit (INDEPENDENT_AMBULATORY_CARE_PROVIDER_SITE_OTHER): Payer: Medicaid Other | Admitting: *Deleted

## 2018-09-09 VITALS — BP 107/57 | HR 91 | Wt 208.2 lb

## 2018-09-09 DIAGNOSIS — O09219 Supervision of pregnancy with history of pre-term labor, unspecified trimester: Principal | ICD-10-CM

## 2018-09-09 DIAGNOSIS — O09899 Supervision of other high risk pregnancies, unspecified trimester: Secondary | ICD-10-CM

## 2018-09-09 NOTE — Progress Notes (Signed)
Jodi Daniel here for 17-P  Injection.  Injection administered without complication. Patient will return in one week for next injection. I also called for the next shipment of 17P.   Randell Detter,RN 09/09/2018  11:37 AM

## 2018-09-14 ENCOUNTER — Ambulatory Visit (INDEPENDENT_AMBULATORY_CARE_PROVIDER_SITE_OTHER): Payer: Medicaid Other | Admitting: Family Medicine

## 2018-09-14 ENCOUNTER — Encounter: Payer: Self-pay | Admitting: Family Medicine

## 2018-09-14 VITALS — BP 126/79 | HR 96 | Wt 211.2 lb

## 2018-09-14 DIAGNOSIS — O0990 Supervision of high risk pregnancy, unspecified, unspecified trimester: Secondary | ICD-10-CM

## 2018-09-14 DIAGNOSIS — O0992 Supervision of high risk pregnancy, unspecified, second trimester: Secondary | ICD-10-CM

## 2018-09-14 DIAGNOSIS — Z3A24 24 weeks gestation of pregnancy: Secondary | ICD-10-CM
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
## 2018-09-16 ENCOUNTER — Ambulatory Visit (INDEPENDENT_AMBULATORY_CARE_PROVIDER_SITE_OTHER): Payer: Medicaid Other | Admitting: *Deleted

## 2018-09-16 VITALS — BP 119/75 | HR 101 | Ht 69.0 in | Wt 207.8 lb

## 2018-09-16 DIAGNOSIS — O09212 Supervision of pregnancy with history of pre-term labor, second trimester: Secondary | ICD-10-CM

## 2018-09-16 DIAGNOSIS — O09219 Supervision of pregnancy with history of pre-term labor, unspecified trimester: Principal | ICD-10-CM

## 2018-09-16 DIAGNOSIS — O09899 Supervision of other high risk pregnancies, unspecified trimester: Secondary | ICD-10-CM

## 2018-09-16 NOTE — Progress Notes (Signed)
Agree with A & P. 

## 2018-09-16 NOTE — Progress Notes (Signed)
Jodi Daniel here for 17-P  Injection.  Injection administered without complication. Patient will return in one week for next injection.  Osvaldo Human, RN 09/16/2018  2:08 PM

## 2018-09-17 ENCOUNTER — Other Ambulatory Visit: Payer: Self-pay

## 2018-09-17 ENCOUNTER — Encounter (HOSPITAL_COMMUNITY): Payer: Self-pay | Admitting: *Deleted

## 2018-09-17 ENCOUNTER — Inpatient Hospital Stay (HOSPITAL_COMMUNITY)
Admission: AD | Admit: 2018-09-17 | Discharge: 2018-09-17 | Disposition: A | Discharge status: Home / Self Care | Payer: Medicaid Other | Attending: Obstetrics & Gynecology | Admitting: Obstetrics & Gynecology

## 2018-09-17 DIAGNOSIS — O99342 Other mental disorders complicating pregnancy, second trimester: Secondary | ICD-10-CM | POA: Diagnosis not present

## 2018-09-17 DIAGNOSIS — O99512 Diseases of the respiratory system complicating pregnancy, second trimester: Secondary | ICD-10-CM | POA: Diagnosis not present

## 2018-09-17 DIAGNOSIS — R102 Pelvic and perineal pain: Secondary | ICD-10-CM | POA: Insufficient documentation

## 2018-09-17 DIAGNOSIS — O26892 Other specified pregnancy related conditions, second trimester: Secondary | ICD-10-CM | POA: Diagnosis present

## 2018-09-17 DIAGNOSIS — F17290 Nicotine dependence, other tobacco product, uncomplicated: Secondary | ICD-10-CM | POA: Diagnosis not present

## 2018-09-17 DIAGNOSIS — E669 Obesity, unspecified: Secondary | ICD-10-CM | POA: Insufficient documentation

## 2018-09-17 DIAGNOSIS — O99332 Smoking (tobacco) complicating pregnancy, second trimester: Secondary | ICD-10-CM | POA: Diagnosis not present

## 2018-09-17 DIAGNOSIS — Z833 Family history of diabetes mellitus: Secondary | ICD-10-CM | POA: Diagnosis not present

## 2018-09-17 DIAGNOSIS — O99212 Obesity complicating pregnancy, second trimester: Secondary | ICD-10-CM | POA: Diagnosis not present

## 2018-09-17 DIAGNOSIS — Z79899 Other long term (current) drug therapy: Secondary | ICD-10-CM | POA: Insufficient documentation

## 2018-09-17 DIAGNOSIS — J45909 Unspecified asthma, uncomplicated: Secondary | ICD-10-CM | POA: Diagnosis not present

## 2018-09-17 DIAGNOSIS — F419 Anxiety disorder, unspecified: Secondary | ICD-10-CM | POA: Diagnosis not present

## 2018-09-17 DIAGNOSIS — N898 Other specified noninflammatory disorders of vagina: Secondary | ICD-10-CM

## 2018-09-17 DIAGNOSIS — Z809 Family history of malignant neoplasm, unspecified: Secondary | ICD-10-CM | POA: Insufficient documentation

## 2018-09-17 DIAGNOSIS — O9989 Other specified diseases and conditions complicating pregnancy, childbirth and the puerperium: Secondary | ICD-10-CM | POA: Diagnosis not present

## 2018-09-17 DIAGNOSIS — Z3A25 25 weeks gestation of pregnancy: Secondary | ICD-10-CM | POA: Insufficient documentation

## 2018-09-17 LAB — WET PREP, GENITAL
Clue Cells Wet Prep HPF POC: NONE SEEN
Sperm: NONE SEEN
Trich, Wet Prep: NONE SEEN
Yeast Wet Prep HPF POC: NONE SEEN

## 2018-09-17 LAB — URINALYSIS, ROUTINE W REFLEX MICROSCOPIC
Bilirubin Urine: NEGATIVE
Glucose, UA: NEGATIVE mg/dL
Hgb urine dipstick: NEGATIVE
Ketones, ur: NEGATIVE mg/dL
Leukocytes,Ua: NEGATIVE
Nitrite: NEGATIVE
PH: 7 (ref 5.0–8.0)
Protein, ur: NEGATIVE mg/dL
Specific Gravity, Urine: 1.014 (ref 1.005–1.030)

## 2018-09-17 LAB — POCT FERN TEST: POCT Fern Test: NEGATIVE

## 2018-09-17 MED ORDER — COMFORT FIT MATERNITY SUPP MED MISC: 1.0000 | Freq: Every day | 0 refills | Status: DC

## 2018-09-17 NOTE — Discharge Instructions (Signed)
Preterm Labor and Birth Information Pregnancy normally lasts 39-41 weeks. Preterm labor is when labor starts early. It starts before you have been pregnant for 37 whole weeks. What are the risk factors for preterm labor? Preterm labor is more likely to occur in women who:  Have an infection while pregnant.  Have a cervix that is short.  Have gone into preterm labor before.  Have had surgery on their cervix.  Are younger than age 25.  Are older than age 68.  Are African American.  Are pregnant with two or more babies.  Take street drugs while pregnant.  Smoke while pregnant.  Do not gain enough weight while pregnant.  Got pregnant right after another pregnancy. What are the symptoms of preterm labor? Symptoms of preterm labor include:  Cramps. The cramps may feel like the cramps some women get during their period. The cramps may happen with watery poop (diarrhea).  Pain in the belly (abdomen).  Pain in the lower back.  Regular contractions or tightening. It may feel like your belly is getting tighter.  Pressure in the lower belly that seems to get stronger.  More fluid (discharge) leaking from the vagina. The fluid may be watery or bloody.  Water breaking. Why is it important to notice signs of preterm labor? Babies who are born early may not be fully developed. They have a higher chance for:  Long-term heart problems.  Long-term lung problems.  Trouble controlling body systems, like breathing.  Bleeding in the brain.  A condition called cerebral palsy.  Learning difficulties.  Death. These risks are highest for babies who are born before 34 weeks of pregnancy. How is preterm labor treated? Treatment depends on:  How long you were pregnant.  Your condition.  The health of your baby. Treatment may involve:  Having a stitch (suture) placed in your cervix. When you give birth, your cervix opens so the baby can come out. The stitch keeps the cervix  from opening too soon.  Staying at the hospital.  Taking or getting medicines, such as: ? Hormone medicines. ? Medicines to stop contractions. ? Medicines to help the babys lungs develop. ? Medicines to prevent your baby from having cerebral palsy. What should I do if I am in preterm labor? If you think you are going into labor too soon, call your doctor right away. How can I prevent preterm labor?  Do not use any tobacco products. ? Examples of these are cigarettes, chewing tobacco, and e-cigarettes. ? If you need help quitting, ask your doctor.  Do not use street drugs.  Do not use any medicines unless you ask your doctor if they are safe for you.  Talk with your doctor before taking any herbal supplements.  Make sure you gain enough weight.  Watch for infection. If you think you might have an infection, get it checked right away.  If you have gone into preterm labor before, tell your doctor. This information is not intended to replace advice given to you by your health care provider. Make sure you discuss any questions you have with your health care provider. Document Released: 09/26/2008 Document Revised: 12/11/2015 Document Reviewed: 11/21/2015 Elsevier Interactive Patient Education  2019 Elsevier Inc.    PREGNANCY SUPPORT BELT: You are not alone, Seventy-five percent of women have some sort of abdominal or back pain at some point in their pregnancy. Your baby is growing at a fast pace, which means that your whole body is rapidly trying to adjust to the  changes. As your uterus grows, your back may start feeling a bit under stress and this can result in back or abdominal pain that can go from mild, and therefore bearable, to severe pains that will not allow you to sit or lay down comfortably, When it comes to dealing with pregnancy-related pains and cramps, some pregnant women usually prefer natural remedies, which the market is filled with nowadays. For example, wearing a  pregnancy support belt can help ease and lessen your discomfort and pain. WHAT ARE THE BENEFITS OF WEARING A PREGNANCY SUPPORT BELT? A pregnancy support belt provides support to the lower portion of the belly taking some of the weight of the growing uterus and distributing to the other parts of your body. It is designed make you comfortable and gives you extra support. Over the years, the pregnancy apparel market has been studying the needs and wants of pregnant women and they have come up with the most comfortable pregnancy support belts that woman could ever ask for. In fact, you will no longer have to wear a stretched-out or bulky pregnancy belt that is visible underneath your clothes and makes you feel even more uncomfortable. Nowadays, a pregnancy support belt is made of comfortable and stretchy materials that will not irritate your skin but will actually make you feel at ease and you will not even notice you are wearing it. They are easy to put on and adjust during the day and can be worn at night for additional support.  BENEFITS:  Relives Back pain  Relieves Abdominal Muscle and Leg Pain  Stabilizes the Pelvic Ring  Offers a Cushioned Abdominal Lift Pad  Relieves pressure on the Sciatic Nerve Within Minutes WHERE TO GET YOUR PREGNANCY BELT: [employer] [phone] @[phone]  [address] [place], [place] [address]
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
## 2018-09-17 NOTE — MAU Note (Signed)
Pt C/O pelvic pressure that started last night, feels a lot of fetal movement "at the bottom."  Also having lower back pain.  Pain is worse this morning.  Shooting pain down R leg.  States she feels some wetness this morning.  Denies bleeding.

## 2018-09-20 LAB — GC/CHLAMYDIA PROBE AMP (~~LOC~~) NOT AT ARMC
Chlamydia: NEGATIVE
Neisseria Gonorrhea: NEGATIVE

## 2018-09-23 ENCOUNTER — Other Ambulatory Visit: Payer: Self-pay

## 2018-09-23 ENCOUNTER — Ambulatory Visit (INDEPENDENT_AMBULATORY_CARE_PROVIDER_SITE_OTHER): Payer: Medicaid Other | Admitting: *Deleted

## 2018-09-23 VITALS — BP 115/64 | HR 86

## 2018-09-23 DIAGNOSIS — O09899 Supervision of other high risk pregnancies, unspecified trimester: Secondary | ICD-10-CM

## 2018-09-23 DIAGNOSIS — O09219 Supervision of pregnancy with history of pre-term labor, unspecified trimester: Principal | ICD-10-CM

## 2018-09-23 DIAGNOSIS — O09212 Supervision of pregnancy with history of pre-term labor, second trimester: Secondary | ICD-10-CM

## 2018-09-23 NOTE — Progress Notes (Signed)
I have reviewed the chart and agree with nursing staff's documentation of this patient's encounter.  Alane Hanssen DNP, CNM  09/23/18  4:42 PM      

## 2018-09-23 NOTE — Progress Notes (Signed)
Jodi Daniel here for 17-P  Injection.  Injection administered without complication. Patient will return in one week for next injection.  Tanisha Lutes,RN 09/23/2018  2:52 PM

## 2018-09-28 ENCOUNTER — Telehealth: Payer: Self-pay | Admitting: Family Medicine

## 2018-09-28 NOTE — Telephone Encounter (Signed)
Attempted to call patient with the office restrictions due to the coronavirus and to let her know that her paperwork has been completed and ready for pick-up. No answer, left detailed message with the restrictions with office number if needing to reschedule.

## 2018-09-29 DIAGNOSIS — Z029 Encounter for administrative examinations, unspecified: Secondary | ICD-10-CM

## 2018-09-30 ENCOUNTER — Other Ambulatory Visit: Payer: Self-pay

## 2018-09-30 ENCOUNTER — Ambulatory Visit: Payer: Self-pay

## 2018-09-30 ENCOUNTER — Ambulatory Visit (INDEPENDENT_AMBULATORY_CARE_PROVIDER_SITE_OTHER): Payer: Medicaid Other | Admitting: *Deleted

## 2018-09-30 VITALS — BP 130/62 | HR 93 | Temp 97.7°F | Wt 212.5 lb

## 2018-09-30 DIAGNOSIS — O09212 Supervision of pregnancy with history of pre-term labor, second trimester: Secondary | ICD-10-CM

## 2018-09-30 DIAGNOSIS — O26892 Other specified pregnancy related conditions, second trimester: Secondary | ICD-10-CM

## 2018-09-30 DIAGNOSIS — R102 Pelvic and perineal pain: Principal | ICD-10-CM

## 2018-09-30 NOTE — Progress Notes (Signed)
Jodi Daniel here for 17-P  Injection.  Injection administered without complication. Patient will return in one week for next injection.  Linda,RN 09/30/2018  1:22 PM

## 2018-10-06 ENCOUNTER — Other Ambulatory Visit: Payer: Self-pay

## 2018-10-06 ENCOUNTER — Ambulatory Visit (INDEPENDENT_AMBULATORY_CARE_PROVIDER_SITE_OTHER): Payer: Medicaid Other

## 2018-10-06 DIAGNOSIS — O09213 Supervision of pregnancy with history of pre-term labor, third trimester: Secondary | ICD-10-CM

## 2018-10-06 DIAGNOSIS — O09899 Supervision of other high risk pregnancies, unspecified trimester: Secondary | ICD-10-CM

## 2018-10-06 DIAGNOSIS — O09219 Supervision of pregnancy with history of pre-term labor, unspecified trimester: Principal | ICD-10-CM

## 2018-10-06 NOTE — Progress Notes (Signed)
Jodi Daniel here for 17-P  Injection.  Injection administered without complication. Patient will return in one week for next injection.  Pt explained how to self administer SQ auto injector for future 17p injections.  Pt was successful in returning demonstration and all questions answered.  Pt notified that the medication should have been sent to her home today and if has not arrived to please call the office and speak with Joni Reining with her concerns.  Pt given instructions on how to administer 17p and supplies given.  Pt stated understanding with no further questions.    Ralene Bathe, RN 10/06/2018  5:51 PM

## 2018-10-07 ENCOUNTER — Ambulatory Visit: Payer: Self-pay

## 2018-10-07 NOTE — Progress Notes (Signed)
I have reviewed the chart and agree with nursing staff's documentation of this patient's encounter.  Jaynie Collins, MD 10/07/2018 8:01 AM

## 2018-10-11 ENCOUNTER — Encounter: Payer: Self-pay | Admitting: Obstetrics and Gynecology

## 2018-10-11 ENCOUNTER — Telehealth: Payer: Self-pay | Admitting: Obstetrics and Gynecology

## 2018-10-12 NOTE — Telephone Encounter (Signed)
Called patient and advised that [facility] rep advised that the Rx will be sent on 10/13/2018 and if she doesn't recieves it by then to give me a call and I will get the UPS tracking from them. Pt verbalized understanding,.

## 2018-10-25 ENCOUNTER — Encounter: Payer: Self-pay | Admitting: Family Medicine

## 2018-10-27 ENCOUNTER — Ambulatory Visit (INDEPENDENT_AMBULATORY_CARE_PROVIDER_SITE_OTHER): Payer: Medicaid Other | Admitting: Obstetrics and Gynecology

## 2018-10-27 ENCOUNTER — Encounter: Payer: Self-pay | Admitting: Family Medicine

## 2018-10-27 ENCOUNTER — Other Ambulatory Visit: Payer: Self-pay

## 2018-10-27 VITALS — BP 122/72 | HR 88 | Temp 97.9°F | Wt 226.0 lb

## 2018-10-27 DIAGNOSIS — Z98891 History of uterine scar from previous surgery: Secondary | ICD-10-CM

## 2018-10-27 DIAGNOSIS — O0990 Supervision of high risk pregnancy, unspecified, unspecified trimester: Secondary | ICD-10-CM

## 2018-10-27 DIAGNOSIS — Z3A30 30 weeks gestation of pregnancy: Secondary | ICD-10-CM

## 2018-10-27 DIAGNOSIS — O0993 Supervision of high risk pregnancy, unspecified, third trimester: Secondary | ICD-10-CM
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
## 2018-11-01 ENCOUNTER — Other Ambulatory Visit: Payer: Self-pay

## 2018-11-01 ENCOUNTER — Other Ambulatory Visit (INDEPENDENT_AMBULATORY_CARE_PROVIDER_SITE_OTHER): Payer: Medicaid Other

## 2018-11-01 DIAGNOSIS — O0990 Supervision of high risk pregnancy, unspecified, unspecified trimester: Secondary | ICD-10-CM | POA: Diagnosis present

## 2018-11-01 DIAGNOSIS — Z23 Encounter for immunization: Secondary | ICD-10-CM

## 2018-11-02 LAB — HIV ANTIBODY (ROUTINE TESTING W REFLEX): HIV Screen 4th Generation wRfx: NONREACTIVE

## 2018-11-02 LAB — RPR: RPR Ser Ql: NONREACTIVE

## 2018-11-02 LAB — GLUCOSE TOLERANCE, 2 HOURS W/ 1HR
Glucose, 1 hour: 142 mg/dL (ref 65–179)
Glucose, 2 hour: 105 mg/dL (ref 65–152)
Glucose, Fasting: 86 mg/dL (ref 65–91)

## 2018-11-02 LAB — CBC
Hematocrit: 31.7 % — ABNORMAL LOW (ref 34.0–46.6)
Hemoglobin: 10.5 g/dL — ABNORMAL LOW (ref 11.1–15.9)
MCH: 30.8 pg (ref 26.6–33.0)
MCHC: 33.1 g/dL (ref 31.5–35.7)
MCV: 93 fL (ref 79–97)
Platelets: 247 10*3/uL (ref 150–450)
RBC: 3.41 x10E6/uL — ABNORMAL LOW (ref 3.77–5.28)
RDW: 11.9 % (ref 11.7–15.4)
WBC: 8.2 10*3/uL (ref 3.4–10.8)

## 2018-11-04 ENCOUNTER — Telehealth: Payer: Self-pay | Admitting: Obstetrics and Gynecology

## 2018-11-04 MED ORDER — DOCUSATE SODIUM 100 MG PO CAPS: 100.0000 mg | ORAL_CAPSULE | Freq: Two times a day (BID) | ORAL | 2 refills | Status: DC | PRN

## 2018-11-04 NOTE — Telephone Encounter (Signed)
Returned patient call regarding hemorrhoids. States she has three that are sticking out and painful, noticed three days ago. Has tried several creams with no improvement. Patient reports she has one bowel movement every 10 days. Reviewed importance of improving constipation which will improve hemorrhoids, she is to start colace, fiber supplement (metamucil or benefiber) and double water intake. Will f/u at her next visit. Answered all questions.   Baldemar Lenis, M.D. Attending Center for [employer] [facility])

## 2018-11-10 ENCOUNTER — Telehealth: Payer: Self-pay | Admitting: Medical

## 2018-11-10 NOTE — Telephone Encounter (Signed)
Called the patient, she answer the phone the call. Once I identified myself she hung up. Called back and left a voicemail message.

## 2018-11-10 NOTE — Telephone Encounter (Signed)
Called the patient to inform of upcoming appointment, left a detailed voicemail regarding the [employer] webex app, virtual visit, date and time of appointment. °

## 2018-11-12 ENCOUNTER — Telehealth: Payer: Self-pay | Admitting: Obstetrics and Gynecology

## 2018-11-12 ENCOUNTER — Other Ambulatory Visit: Payer: Self-pay

## 2018-11-12 ENCOUNTER — Encounter: Payer: Medicaid Other | Admitting: Obstetrics and Gynecology

## 2018-11-12 NOTE — Progress Notes (Signed)
Called pt for virtual visist, no answer. Left VM for callback.Will attempt to call back in 10 mins.   2nd attempt to call pt for virtual visit, still no answer. Advised front desk will be calling to reschedule appointment.

## 2018-11-12 NOTE — Progress Notes (Signed)
Pt was no show for virtual appt.

## 2018-11-12 NOTE — Telephone Encounter (Signed)
Opened in error

## 2018-11-12 NOTE — Telephone Encounter (Signed)
Called the patient to inform of missed appointment. Left a message regarding the missed appointment and calling our clinic to reschedule. Sending a missed appointment letter.

## 2018-11-22 ENCOUNTER — Ambulatory Visit (INDEPENDENT_AMBULATORY_CARE_PROVIDER_SITE_OTHER): Payer: Medicaid Other | Admitting: Obstetrics & Gynecology

## 2018-11-22 ENCOUNTER — Encounter (HOSPITAL_COMMUNITY): Payer: Self-pay | Admitting: *Deleted

## 2018-11-22 ENCOUNTER — Telehealth: Payer: Self-pay | Admitting: Obstetrics & Gynecology

## 2018-11-22 ENCOUNTER — Inpatient Hospital Stay (HOSPITAL_COMMUNITY)
Admission: AD | Admit: 2018-11-22 | Discharge: 2018-11-22 | Disposition: A | Discharge status: Home / Self Care | Payer: Medicaid Other | Attending: Obstetrics and Gynecology | Admitting: Obstetrics and Gynecology

## 2018-11-22 ENCOUNTER — Other Ambulatory Visit: Payer: Self-pay

## 2018-11-22 DIAGNOSIS — R51 Headache: Secondary | ICD-10-CM | POA: Insufficient documentation

## 2018-11-22 DIAGNOSIS — Z87891 Personal history of nicotine dependence: Secondary | ICD-10-CM | POA: Insufficient documentation

## 2018-11-22 DIAGNOSIS — O09213 Supervision of pregnancy with history of pre-term labor, third trimester: Secondary | ICD-10-CM | POA: Diagnosis not present

## 2018-11-22 DIAGNOSIS — O09899 Supervision of other high risk pregnancies, unspecified trimester: Secondary | ICD-10-CM

## 2018-11-22 DIAGNOSIS — Z98891 History of uterine scar from previous surgery: Secondary | ICD-10-CM

## 2018-11-22 DIAGNOSIS — O26893 Other specified pregnancy related conditions, third trimester: Secondary | ICD-10-CM | POA: Insufficient documentation

## 2018-11-22 DIAGNOSIS — O0990 Supervision of high risk pregnancy, unspecified, unspecified trimester: Secondary | ICD-10-CM

## 2018-11-22 DIAGNOSIS — Z3A34 34 weeks gestation of pregnancy: Secondary | ICD-10-CM | POA: Insufficient documentation

## 2018-11-22 DIAGNOSIS — O0993 Supervision of high risk pregnancy, unspecified, third trimester: Secondary | ICD-10-CM

## 2018-11-22 DIAGNOSIS — O2600 Excessive weight gain in pregnancy, unspecified trimester: Secondary | ICD-10-CM | POA: Insufficient documentation

## 2018-11-22 DIAGNOSIS — O2603 Excessive weight gain in pregnancy, third trimester: Secondary | ICD-10-CM | POA: Diagnosis not present

## 2018-11-22 LAB — URINALYSIS, ROUTINE W REFLEX MICROSCOPIC
Bilirubin Urine: NEGATIVE
Glucose, UA: NEGATIVE mg/dL
Hgb urine dipstick: NEGATIVE
Ketones, ur: NEGATIVE mg/dL
Leukocytes,Ua: NEGATIVE
Nitrite: NEGATIVE
Protein, ur: NEGATIVE mg/dL
Specific Gravity, Urine: 1.01 (ref 1.005–1.030)
pH: 6.5 (ref 5.0–8.0)

## 2018-11-22 MED ORDER — ACETAMINOPHEN 500 MG PO TABS
1000.0000 mg | ORAL_TABLET | Freq: Once | ORAL | Status: AC
  Administered 2018-11-22: 18:00:00 1000 mg via ORAL
  Filled 2018-11-22: qty 2

## 2018-11-22 MED ORDER — METOCLOPRAMIDE HCL 5 MG/ML IJ SOLN: 10.0000 mg | Freq: Once | INTRAMUSCULAR | Status: DC

## 2018-11-22 MED ORDER — METOCLOPRAMIDE HCL 10 MG PO TABS: 10.0000 mg | ORAL_TABLET | Freq: Three times a day (TID) | ORAL | 0 refills | Status: DC | PRN

## 2018-11-22 MED ORDER — FAMOTIDINE 20 MG PO TABS: 20.0000 mg | ORAL_TABLET | Freq: Two times a day (BID) | ORAL | 0 refills | Status: DC

## 2018-11-22 MED ORDER — METOCLOPRAMIDE HCL 10 MG PO TABS
10.0000 mg | ORAL_TABLET | Freq: Once | ORAL | Status: AC
  Administered 2018-11-22: 18:00:00 10 mg via ORAL
  Filled 2018-11-22: qty 1

## 2018-11-22 NOTE — Telephone Encounter (Signed)
Called the patient to confirm the appointment, left a detailed voicemail message with our new location and appointment time. °

## 2018-11-22 NOTE — Discharge Instructions (Signed)
Take reglan & tylenol together for headaches (still follow package instructions for dosing).     General Headache Without Cause A headache is pain or discomfort felt around the head or neck area. The specific cause of a headache may not be found. There are many causes and types of headaches. A few common ones are:  Tension headaches.  Migraine headaches.  Cluster headaches.  Chronic daily headaches. Follow these instructions at home: Watch your condition for any changes. Let your health care provider know about them. Take these steps to help with your condition: Managing pain      Take over-the-counter and prescription medicines only as told by your health care provider.  Lie down in a dark, quiet room when you have a headache.  If directed, put ice on your head and neck area: ? Put ice in a plastic bag. ? Place a towel between your skin and the bag. ? Leave the ice on for 20 minutes, 2-3 times per day.  If directed, apply heat to the affected area. Use the heat source that your health care provider recommends, such as a moist heat pack or a heating pad. ? Place a towel between your skin and the heat source. ? Leave the heat on for 20-30 minutes. ? Remove the heat if your skin turns bright red. This is especially important if you are unable to feel pain, heat, or cold. You may have a greater risk of getting burned.  Keep lights dim if bright lights bother you or make your headaches worse. Eating and drinking  Eat meals on a regular schedule.  If you drink alcohol: ? Limit how much you use to:  0-1 drink a day for women.  0-2 drinks a day for men. ? Be aware of how much alcohol is in your drink. In the [place] one drink equals one 12 oz bottle of beer (355 mL), one 5 oz glass of wine (148 mL), or one 1 oz glass of hard liquor (44 mL).  Stop drinking caffeine, or decrease the amount of caffeine you drink. General instructions   Keep a headache journal to help find out  what may trigger your headaches. For example, write down: ? What you eat and drink. ? How much sleep you get. ? Any change to your diet or medicines.  Try massage or other relaxation techniques.  Limit stress.  Sit up straight, and do not tense your muscles.  Do not use any products that contain nicotine or tobacco, such as cigarettes, e-cigarettes, and chewing tobacco. If you need help quitting, ask your health care provider.  Exercise regularly as told by your health care provider.  Sleep on a regular schedule. Get 7-9 hours of sleep each night, or the amount recommended by your health care provider.  Keep all follow-up visits as told by your health care provider. This is important. Contact a health care provider if:  Your symptoms are not helped by medicine.  You have a headache that is different from the usual headache.  You have nausea or you vomit.  You have a fever. Get help right away if:  Your headache becomes severe quickly.  Your headache gets worse after moderate to intense physical activity.  You have repeated vomiting.  You have a stiff neck.  You have a loss of vision.  You have problems with speech.  You have pain in the eye or ear.  You have muscular weakness or loss of muscle control.  You lose your  balance or have trouble walking.  You feel faint or pass out.  You have confusion.  You have a seizure. Summary  A headache is pain or discomfort felt around the head or neck area.  There are many causes and types of headaches. In some cases, the cause may not be found.  Keep a headache journal to help find out what may trigger your headaches. Watch your condition for any changes. Let your health care provider know about them.  Contact a health care provider if you have a headache that is different from the usual headache, or if your symptoms are not helped by medicine.  Get help right away if your headache becomes severe, you vomit, you have  a loss of vision, you lose your balance, or you have a seizure. This information is not intended to replace advice given to you by your health care provider. Make sure you discuss any questions you have with your health care provider. Document Released: 06/30/2005 Document Revised: 01/18/2018 Document Reviewed: 01/18/2018 Elsevier Interactive Patient Education  2019 [employer]     Heartburn During Pregnancy Heartburn is a type of pain or discomfort that can happen in the throat or chest. It is often described as a burning sensation. Heartburn is common during pregnancy because:  A hormone (progesterone) that is released during pregnancy may relax the valve (lower esophageal sphincter, or LES) that separates the esophagus from the stomach. This allows stomach acid to move up into the esophagus, causing heartburn.  The uterus gets larger and pushes up on the stomach, which pushes more acid into the esophagus. This is especially true in the later stages of pregnancy. Heartburn usually goes away or gets better after giving birth. What are the causes? Heartburn is caused by stomach acid backing up into the esophagus (reflux). Reflux can be triggered by:  Changing hormone levels.  Large meals.  Certain foods and beverages, such as coffee, chocolate, onions, and peppermint.  Exercise.  Increased stomach acid production. What increases the risk? You are more likely to experience heartburn during pregnancy if you:  Had heartburn prior to becoming pregnant.  Have been pregnant more than once before.  Are overweight or obese. The likelihood that you will get heartburn also increases as you get farther along in your pregnancy, especially during the last trimester. What are the signs or symptoms? Symptoms of this condition include:  Burning pain in the chest or lower throat.  Bitter taste in the mouth.  Coughing.  Problems swallowing.  Vomiting.  Hoarse  voice.  Asthma. Symptoms may get worse when you lie down or bend over. Symptoms are often worse at night. How is this diagnosed? This condition is diagnosed based on:  Your medical history.  Your symptoms.  Blood tests to check for a certain type of bacteria associated with heartburn.  Whether taking heartburn medicine relieves your symptoms.  Examination of the stomach and esophagus using a tube with a light and camera on the end (endoscopy). How is this treated? Treatment varies depending on how severe your symptoms are. Your health care provider may recommend:  Over-the-counter medicines (antacids or acid reducers) for mild heartburn.  Prescription medicines to decrease stomach acid or to protect your stomach lining.  Certain changes in your diet.  Raising the head of your bed so it is higher than the foot of the bed. This helps prevent stomach acid from backing up into the esophagus when you are lying down. Follow these instructions at home: Eating  and drinking  Do not drink alcohol during your pregnancy.  Identify foods and beverages that make your symptoms worse, and avoid them.  Beverages that you may want to avoid include: ? Coffee and tea (with or without caffeine). ? Energy drinks and sports drinks. ? Carbonated drinks or sodas. ? Citrus fruit juices.  Foods that you may want to avoid include: ? Chocolate and cocoa. ? Peppermint and mint flavorings. ? Garlic, onions, and horseradish. ? Spicy and acidic foods, including peppers, chili powder, curry powder, vinegar, hot sauces, and barbecue sauce. ? Citrus fruits, such as oranges, lemons, and limes. ? Tomato-based foods, such as red sauce, chili, and salsa. ? Fried and fatty foods, such as donuts, french fries, potato chips, and high-fat dressings. ? High-fat meats, such as hot dogs, cold cuts, sausage, ham, and bacon. ? High-fat dairy items, such as whole milk, butter, and cheese.  Eat small, frequent meals  instead of large meals.  Avoid drinking large amounts of liquid with your meals.  Avoid eating meals during the 2-3 hours before bedtime.  Avoid lying down right after you eat.  Do not exercise right after you eat. Medicines  Take over-the-counter and prescription medicines only as told by your health care provider.  Do not take aspirin, ibuprofen, or other NSAIDs unless your health care provider tells you to do that.  You may be instructed to avoid medicines that contain sodium bicarbonate. General instructions   If directed, raise the head of your bed about 6 inches (15 cm) by putting blocks under the legs. Sleeping with more pillows does not effectively relieve heartburn because it only changes the position of your head.  Do not use any products that contain nicotine or tobacco, such as cigarettes and e-cigarettes. If you need help quitting, ask your health care provider.  Wear loose-fitting clothing.  Try to reduce your stress, such as with yoga or meditation. If you need help managing stress, ask your health care provider.  Maintain a healthy weight. If you are overweight, work with your health care provider to safely lose weight.  Keep all follow-up visits as told by your health care provider. This is important. Contact a health care provider if:  You develop new symptoms.  Your symptoms do not improve with treatment.  You have unexplained weight loss.  You have difficulty swallowing.  You make loud sounds when you breathe (wheeze).  You have a cough that does not go away.  You have frequent heartburn for more than 2 weeks.  You have nausea or vomiting that does not get better with treatment.  You have pain in your abdomen. Get help right away if:  You have severe chest pain that spreads to your arm, neck, or jaw.  You feel sweaty, dizzy, or light-headed.  You have shortness of breath.  You have pain when swallowing.  You vomit, and your vomit looks  like blood or coffee grounds.  Your stool is bloody or black. This information is not intended to replace advice given to you by your health care provider. Make sure you discuss any questions you have with your health care provider. Document Released: 06/27/2000 Document Revised: 03/17/2016 Document Reviewed: 03/17/2016 Elsevier Interactive Patient Education  2019 [employer].

## 2018-11-22 NOTE — Progress Notes (Signed)
I connected with  Jodi Daniel on 11/22/18 at  2:15 PM EDT by telephone and verified that I am speaking with the correct person using two identifiers.   I discussed the limitations, risks, security and privacy concerns of performing an evaluation and management service by telephone and the availability of in person appointments. I also discussed with the patient that there may be a patient responsible charge related to this service. The patient expressed understanding and agreed to proceed.  [name], CMA 11/22/2018  2:13 PM
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
## 2018-11-22 NOTE — MAU Note (Signed)
Pt has a headache x 3 days, took tylenol, has not helped. Rates 7/10. Severe heartburn, taking medication, but not working. Had diarrhea the last day and half, no n/v. No one else has been sick.
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
## 2018-11-22 NOTE — Progress Notes (Signed)
LM for pt that we are calling in regards to her virtual visit on 1415.  Please give the office a call so that we can begin visit.

## 2018-11-25 ENCOUNTER — Telehealth: Payer: Self-pay | Admitting: Obstetrics and Gynecology

## 2018-11-27 ENCOUNTER — Encounter (HOSPITAL_COMMUNITY): Payer: Self-pay | Admitting: *Deleted

## 2018-11-27 ENCOUNTER — Other Ambulatory Visit: Payer: Self-pay

## 2018-11-27 ENCOUNTER — Inpatient Hospital Stay (HOSPITAL_COMMUNITY)
Admission: AD | Admit: 2018-11-27 | Discharge: 2018-11-27 | Disposition: A | Discharge status: Home / Self Care | Payer: Medicaid Other | Attending: Obstetrics and Gynecology | Admitting: Obstetrics and Gynecology

## 2018-11-27 DIAGNOSIS — O99891 Other specified diseases and conditions complicating pregnancy: Secondary | ICD-10-CM

## 2018-11-27 DIAGNOSIS — Z3A35 35 weeks gestation of pregnancy: Secondary | ICD-10-CM

## 2018-11-27 DIAGNOSIS — Z87891 Personal history of nicotine dependence: Secondary | ICD-10-CM | POA: Insufficient documentation

## 2018-11-27 DIAGNOSIS — O9989 Other specified diseases and conditions complicating pregnancy, childbirth and the puerperium: Secondary | ICD-10-CM | POA: Diagnosis not present

## 2018-11-27 DIAGNOSIS — O4703 False labor before 37 completed weeks of gestation, third trimester: Secondary | ICD-10-CM | POA: Diagnosis not present

## 2018-11-27 DIAGNOSIS — R109 Unspecified abdominal pain: Secondary | ICD-10-CM | POA: Diagnosis present

## 2018-11-27 DIAGNOSIS — M549 Dorsalgia, unspecified: Secondary | ICD-10-CM | POA: Diagnosis not present

## 2018-11-27 DIAGNOSIS — O479 False labor, unspecified: Secondary | ICD-10-CM

## 2018-11-27 DIAGNOSIS — M545 Low back pain: Secondary | ICD-10-CM | POA: Insufficient documentation

## 2018-11-27 LAB — URINALYSIS, ROUTINE W REFLEX MICROSCOPIC
Bilirubin Urine: NEGATIVE
Glucose, UA: NEGATIVE mg/dL
Hgb urine dipstick: NEGATIVE
Ketones, ur: NEGATIVE mg/dL
Leukocytes,Ua: NEGATIVE
Nitrite: NEGATIVE
Protein, ur: NEGATIVE mg/dL
Specific Gravity, Urine: 1.011 (ref 1.005–1.030)
pH: 6 (ref 5.0–8.0)

## 2018-11-27 MED ORDER — CYCLOBENZAPRINE HCL 5 MG PO TABS: 5.0000 mg | ORAL_TABLET | Freq: Three times a day (TID) | ORAL | 0 refills | Status: DC | PRN

## 2018-11-27 MED ORDER — ACETAMINOPHEN 500 MG PO TABS
1000.0000 mg | ORAL_TABLET | Freq: Once | ORAL | Status: AC
  Administered 2018-11-27: 16:00:00 1000 mg via ORAL
  Filled 2018-11-27: qty 2
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
## 2018-11-27 NOTE — MAU Note (Signed)
Pt reports she started having severe back pain about 1.5 hrs ago. Back pain is constant. Then she is also having intermittent abd cramping as well. Good fetal movment felt. Denies any leaking of fluid. Hx of placental abruption with previous pregnancy.

## 2018-11-27 NOTE — Discharge Instructions (Signed)
Fetal Movement Counts Patient Name: ________________________________________________ Patient Due Date: ____________________ What is a fetal movement count?  A fetal movement count is the number of times that you feel your baby move during a certain amount of time. This may also be called a fetal kick count. A fetal movement count is recommended for every pregnant woman. You may be asked to start counting fetal movements as early as week 28 of your pregnancy. Pay attention to when your baby is most active. You may notice your baby's sleep and wake cycles. You may also notice things that make your baby move more. You should do a fetal movement count:  When your baby is normally most active.  At the same time each day. A good time to count movements is while you are resting, after having something to eat and drink. How do I count fetal movements? 1. Find a quiet, comfortable area. Sit, or lie down on your side. 2. Write down the date, the start time and stop time, and the number of movements that you felt between those two times. Take this information with you to your health care visits. 3. For 2 hours, count kicks, flutters, swishes, rolls, and jabs. You should feel at least 10 movements during 2 hours. 4. You may stop counting after you have felt 10 movements. 5. If you do not feel 10 movements in 2 hours, have something to eat and drink. Then, keep resting and counting for 1 hour. If you feel at least 4 movements during that hour, you may stop counting. Contact a health care provider if:  You feel fewer than 4 movements in 2 hours.  Your baby is not moving like he or she usually does. Date: ____________ Start time: ____________ Stop time: ____________ Movements: ____________ Date: ____________ Start time: ____________ Stop time: ____________ Movements: ____________ Date: ____________ Start time: ____________ Stop time: ____________ Movements: ____________ Date: ____________ Start time:  ____________ Stop time: ____________ Movements: ____________ Date: ____________ Start time: ____________ Stop time: ____________ Movements: ____________ Date: ____________ Start time: ____________ Stop time: ____________ Movements: ____________ Date: ____________ Start time: ____________ Stop time: ____________ Movements: ____________ Date: ____________ Start time: ____________ Stop time: ____________ Movements: ____________ Date: ____________ Start time: ____________ Stop time: ____________ Movements: ____________ This information is not intended to replace advice given to you by your health care provider. Make sure you discuss any questions you have with your health care provider. Document Released: 07/30/2006 Document Revised: 02/27/2016 Document Reviewed: 08/09/2015 Elsevier Interactive Patient Education  2019 [employer] Back Pain in Pregnancy Back pain during pregnancy is common. Back pain may be caused by several factors that are related to changes during your pregnancy. Follow these instructions at home: Managing pain, stiffness, and swelling      If directed, for sudden (acute) back pain, put ice on the painful area. ? Put ice in a plastic bag. ? Place a towel between your skin and the bag. ? Leave the ice on for 20 minutes, 2-3 times per day.  If directed, apply heat to the affected area before you exercise. Use the heat source that your health care provider recommends, such as a moist heat pack or a heating pad. ? Place a towel between your skin and the heat source. ? Leave the heat on for 20-30 minutes. ? Remove the heat if your skin turns bright red. This is especially important if you are unable to feel pain, heat, or cold. You may have a greater risk of getting burned.  If  source.  ? Leave the heat on for 20-30 minutes.  ? Remove the heat if your skin turns bright red. This is especially important if you are unable to feel pain, heat, or cold. You may have a greater risk of getting burned.  · If directed, massage the affected area.  Activity  · Exercise as told by your health care provider. Gentle exercise is the best way to prevent or manage back pain.  · Listen to your body when lifting. If  lifting hurts, ask for help or bend your knees. This uses your leg muscles instead of your back muscles.  · Squat down when picking up something from the floor. Do not bend over.  · Only use bed rest for short periods as told by your health care provider. Bed rest should only be used for the most severe episodes of back pain.  Standing, sitting, and lying down  · Do not stand in one place for long periods of time.  · Use good posture when sitting. Make sure your head rests over your shoulders and is not hanging forward. Use a pillow on your lower back if necessary.  · Try sleeping on your side, preferably the left side, with a pregnancy support pillow or 1-2 regular pillows between your legs.  ? If you have back pain after a night's rest, your bed may be too soft.  ? A firm mattress may provide more support for your back during pregnancy.  General instructions  · Do not wear high heels.  · Eat a healthy diet. Try to gain weight within your health care provider's recommendations.  · Use a maternity girdle, elastic sling, or back brace as told by your health care provider.  · Take over-the-counter and prescription medicines only as told by your health care provider.  · Work with a physical therapist or massage therapist to find ways to manage back pain. Acupuncture or massage therapy may be helpful.  · Keep all follow-up visits as told by your health care provider. This is important.  Contact a health care provider if:  · Your back pain interferes with your daily activities.  · You have increasing pain in other parts of your body.  Get help right away if:  · You develop numbness, tingling, weakness, or problems with the use of your arms or legs.  · You develop severe back pain that is not controlled with medicine.  · You have a change in bowel or bladder control.  · You develop shortness of breath, dizziness, or you faint.  · You develop nausea, vomiting, or sweating.  · You have back pain that is a rhythmic, cramping  pain similar to labor pains. Labor pain is usually 1-2 minutes apart, lasts for about 1 minute, and involves a bearing down feeling or pressure in your pelvis.  · You have back pain and your water breaks or you have vaginal bleeding.  · You have back pain or numbness that travels down your leg.  · Your back pain developed after you fell.  · You develop pain on one side of your back.  · You see blood in your urine.  · You develop skin blisters in the area of your back pain.  Summary  · Back pain may be caused by several factors that are related to changes during your pregnancy.  · Follow instructions as told by your health care provider for managing pain, stiffness, and swelling.  · Exercise as told by your health   2019 [employer]vier Inc[employer] ° °

## 2018-11-29 ENCOUNTER — Telehealth: Payer: Self-pay | Admitting: Family Medicine

## 2018-11-29 NOTE — Telephone Encounter (Signed)
Patient called in stating that she was seen in MAU for contractions and high blood pressure and she wants to know if she can be seen today because her blood pressures are still high. Spoke with Addison Naegeli about this matter and she spoke with the patient on what to going forward and that her blood pressures were normal. Patient verbalized understanding and had no further questions.

## 2018-12-01 ENCOUNTER — Telehealth: Payer: Self-pay

## 2018-12-01 NOTE — Telephone Encounter (Signed)
Pt called Nurse Line regarding ear pain today and what she can take for it, she called at 1:12p.Called pt and advised her to take Tylenol & I will see if we can push her next appt up from 12/09/2018. No answer, left VM.

## 2018-12-02 ENCOUNTER — Other Ambulatory Visit: Payer: Self-pay

## 2018-12-02 ENCOUNTER — Encounter (HOSPITAL_COMMUNITY): Payer: Self-pay

## 2018-12-02 ENCOUNTER — Inpatient Hospital Stay (HOSPITAL_COMMUNITY)
Admission: AD | Admit: 2018-12-02 | Discharge: 2018-12-02 | Disposition: A | Discharge status: Home / Self Care | Payer: Medicaid Other | Attending: Obstetrics and Gynecology | Admitting: Obstetrics and Gynecology

## 2018-12-02 DIAGNOSIS — Z0371 Encounter for suspected problem with amniotic cavity and membrane ruled out: Secondary | ICD-10-CM | POA: Diagnosis not present

## 2018-12-02 DIAGNOSIS — O99513 Diseases of the respiratory system complicating pregnancy, third trimester: Secondary | ICD-10-CM | POA: Diagnosis not present

## 2018-12-02 DIAGNOSIS — E669 Obesity, unspecified: Secondary | ICD-10-CM | POA: Diagnosis not present

## 2018-12-02 DIAGNOSIS — Z733 Stress, not elsewhere classified: Secondary | ICD-10-CM | POA: Diagnosis not present

## 2018-12-02 DIAGNOSIS — J45909 Unspecified asthma, uncomplicated: Secondary | ICD-10-CM | POA: Diagnosis not present

## 2018-12-02 DIAGNOSIS — Z809 Family history of malignant neoplasm, unspecified: Secondary | ICD-10-CM | POA: Insufficient documentation

## 2018-12-02 DIAGNOSIS — O99343 Other mental disorders complicating pregnancy, third trimester: Secondary | ICD-10-CM | POA: Diagnosis not present

## 2018-12-02 DIAGNOSIS — O99213 Obesity complicating pregnancy, third trimester: Secondary | ICD-10-CM | POA: Diagnosis not present

## 2018-12-02 DIAGNOSIS — F419 Anxiety disorder, unspecified: Secondary | ICD-10-CM | POA: Diagnosis not present

## 2018-12-02 DIAGNOSIS — F329 Major depressive disorder, single episode, unspecified: Secondary | ICD-10-CM | POA: Insufficient documentation

## 2018-12-02 DIAGNOSIS — O34219 Maternal care for unspecified type scar from previous cesarean delivery: Secondary | ICD-10-CM | POA: Insufficient documentation

## 2018-12-02 DIAGNOSIS — Z3A36 36 weeks gestation of pregnancy: Secondary | ICD-10-CM | POA: Diagnosis not present

## 2018-12-02 DIAGNOSIS — Z87891 Personal history of nicotine dependence: Secondary | ICD-10-CM | POA: Insufficient documentation

## 2018-12-02 LAB — POCT FERN TEST: POCT Fern Test: NEGATIVE

## 2018-12-02 NOTE — MAU Note (Signed)
States she woke up around 2200 from a nap and felt like her underwear was wet-not feeling as much leak out now.  Clear fluid.  No VB.  Not feeling any ctx.  + FM.
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
## 2018-12-02 NOTE — Discharge Instructions (Signed)
Braxton Hicks Contractions Contractions of the uterus can occur throughout pregnancy, but they are not always a sign that you are in labor. You may have practice contractions called Braxton Hicks contractions. These false labor contractions are sometimes confused with true labor. What are Braxton Hicks contractions? Braxton Hicks contractions are tightening movements that occur in the muscles of the uterus before labor. Unlike true labor contractions, these contractions do not result in opening (dilation) and thinning of the cervix. Toward the end of pregnancy (32-34 weeks), Braxton Hicks contractions can happen more often and may become stronger. These contractions are sometimes difficult to tell apart from true labor because they can be very uncomfortable. You should not feel embarrassed if you go to the hospital with false labor. Sometimes, the only way to tell if you are in true labor is for your health care provider to look for changes in the cervix. The health care provider will do a physical exam and may monitor your contractions. If you are not in true labor, the exam should show that your cervix is not dilating and your water has not broken. If there are no other health problems associated with your pregnancy, it is completely safe for you to be sent home with false labor. You may continue to have Braxton Hicks contractions until you go into true labor. How to tell the difference between true labor and false labor True labor  Contractions last 30-70 seconds.  Contractions become very regular.  Discomfort is usually felt in the top of the uterus, and it spreads to the lower abdomen and low back.  Contractions do not go away with walking.  Contractions usually become more intense and increase in frequency.  The cervix dilates and gets thinner. False labor  Contractions are usually shorter and not as strong as true labor contractions.  Contractions are usually irregular.  Contractions  are often felt in the front of the lower abdomen and in the groin.  Contractions may go away when you walk around or change positions while lying down.  Contractions get weaker and are shorter-lasting as time goes on.  The cervix usually does not dilate or become thin. Follow these instructions at home:   Take over-the-counter and prescription medicines only as told by your health care provider.  Keep up with your usual exercises and follow other instructions from your health care provider.  Eat and drink lightly if you think you are going into labor.  If Braxton Hicks contractions are making you uncomfortable: ? Change your position from lying down or resting to walking, or change from walking to resting. ? Sit and rest in a tub of warm water. ? Drink enough fluid to keep your urine pale yellow. Dehydration may cause these contractions. ? Do slow and deep breathing several times an hour.  Keep all follow-up prenatal visits as told by your health care provider. This is important. Contact a health care provider if:  You have a fever.  You have continuous pain in your abdomen. Get help right away if:  Your contractions become stronger, more regular, and closer together.  You have fluid leaking or gushing from your vagina.  You pass blood-tinged mucus (bloody show).  You have bleeding from your vagina.  You have low back pain that you never had before.  You feel your baby's head pushing down and causing pelvic pressure.  Your baby is not moving inside you as much as it used to. Summary  Contractions that occur before labor are   called Braxton Hicks contractions, false labor, or practice contractions.  Braxton Hicks contractions are usually shorter, weaker, farther apart, and less regular than true labor contractions. True labor contractions usually become progressively stronger and regular, and they become more frequent.  Manage discomfort from Braxton Hicks contractions  by changing position, resting in a warm bath, drinking plenty of water, or practicing deep breathing. This information is not intended to replace advice given to you by your health care provider. Make sure you discuss any questions you have with your health care provider. Document Released: 11/13/2016 Document Revised: 04/14/2017 Document Reviewed: 11/13/2016 Elsevier Interactive Patient Education  2019 [employer]  

## 2018-12-07 ENCOUNTER — Telehealth: Payer: Self-pay | Admitting: Obstetrics and Gynecology

## 2018-12-07 NOTE — Telephone Encounter (Signed)
Attempted to call patient to discuss moving her appointment up per requested. Did not get an answer. Voicemail was left to call us back.

## 2018-12-09 ENCOUNTER — Other Ambulatory Visit: Payer: Self-pay

## 2018-12-09 ENCOUNTER — Other Ambulatory Visit (HOSPITAL_COMMUNITY)
Admission: RE | Admit: 2018-12-09 | Discharge: 2018-12-09 | Disposition: A | Discharge status: Home / Self Care | Payer: Medicaid Other | Source: Ambulatory Visit | Attending: Obstetrics and Gynecology | Admitting: Obstetrics and Gynecology

## 2018-12-09 ENCOUNTER — Ambulatory Visit (INDEPENDENT_AMBULATORY_CARE_PROVIDER_SITE_OTHER): Payer: Medicaid Other | Admitting: Obstetrics and Gynecology

## 2018-12-09 VITALS — BP 119/74 | HR 99 | Temp 98.1°F | Wt 236.6 lb

## 2018-12-09 DIAGNOSIS — O0990 Supervision of high risk pregnancy, unspecified, unspecified trimester: Secondary | ICD-10-CM | POA: Diagnosis not present

## 2018-12-09 DIAGNOSIS — Z3A37 37 weeks gestation of pregnancy: Secondary | ICD-10-CM

## 2018-12-09 DIAGNOSIS — O0993 Supervision of high risk pregnancy, unspecified, third trimester: Secondary | ICD-10-CM

## 2018-12-09 DIAGNOSIS — Z98891 History of uterine scar from previous surgery: Secondary | ICD-10-CM
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
## 2018-12-09 NOTE — Progress Notes (Signed)
Pt states is still having headaches, takes Tylenol, help a little.Also having dizziness &lightheadiness

## 2018-12-09 NOTE — Patient Instructions (Signed)

## 2018-12-10 LAB — GC/CHLAMYDIA PROBE AMP (~~LOC~~) NOT AT ARMC
Chlamydia: NEGATIVE
Neisseria Gonorrhea: NEGATIVE

## 2018-12-13 LAB — CULTURE, BETA STREP (GROUP B ONLY): Strep Gp B Culture: NEGATIVE

## 2018-12-14 ENCOUNTER — Other Ambulatory Visit: Payer: Self-pay

## 2018-12-14 ENCOUNTER — Inpatient Hospital Stay (HOSPITAL_COMMUNITY)
Admission: AD | Admit: 2018-12-14 | Discharge: 2018-12-14 | Disposition: A | Discharge status: Home / Self Care | Payer: Medicaid Other | Attending: Obstetrics & Gynecology | Admitting: Obstetrics & Gynecology

## 2018-12-14 ENCOUNTER — Encounter (HOSPITAL_COMMUNITY): Payer: Self-pay | Admitting: *Deleted

## 2018-12-14 DIAGNOSIS — O471 False labor at or after 37 completed weeks of gestation: Secondary | ICD-10-CM | POA: Diagnosis not present

## 2018-12-14 DIAGNOSIS — Z3A38 38 weeks gestation of pregnancy: Secondary | ICD-10-CM | POA: Insufficient documentation

## 2018-12-14 DIAGNOSIS — O479 False labor, unspecified: Secondary | ICD-10-CM

## 2018-12-14 NOTE — Progress Notes (Signed)
I have communicated with D. Simpson, Student NM and reviewed vital signs:   BP: 124/76 R: 20 P: 86 T: 97.6  Vaginal exam:  Dilation: 2.5 Effacement (%): 70 Cervical Position: Posterior Station: -2 Presentation: Vertex Exam by:: n druebbisch rn,   Also reviewed contraction pattern and that non-stress test is reactive.  It has been documented that patient is contracting every 3 minutes with minimal cervical change over 1.5 hours not indicating active labor.  Patient denies any other complaints.  Based on this report provider has given order for discharge.  A discharge order and diagnosis entered by a provider.   Labor discharge instructions reviewed with patient.

## 2018-12-14 NOTE — MAU Note (Signed)
CTX since 0240.  Also had 3 episodes of watery diarrhea.  Now occurring every 20 mins.  No LOF/VB.  + FM.

## 2018-12-14 NOTE — Progress Notes (Signed)
Pt in BR from 0744 to 0800, EFM tracing sketchy @ times.

## 2018-12-14 NOTE — Discharge Instructions (Signed)

## 2018-12-16 ENCOUNTER — Telehealth: Payer: Self-pay | Admitting: *Deleted

## 2018-12-16 ENCOUNTER — Telehealth: Payer: Self-pay

## 2018-12-16 NOTE — Telephone Encounter (Signed)
Pt left VM message stating that she would like to speak with a nurse about her pain. She has questions.

## 2018-12-16 NOTE — Telephone Encounter (Signed)
Returned Pts call from Nurse line regarding having pain, Pt states was having pain in lower pelvic area but thinks baby was just moving downward, Mucous D/C.Advised if gets worse & have contractions back to back and rush of fluid, to go to MAU at [facility].Pt verbalized understanding.

## 2018-12-22 ENCOUNTER — Telehealth: Payer: Self-pay | Admitting: Internal Medicine

## 2018-12-22 NOTE — Telephone Encounter (Signed)
Patient has been and instructed to wear a face mask, and she is aware no visitors are not allowed at this time and patient do not have any symptoms.

## 2018-12-23 ENCOUNTER — Inpatient Hospital Stay (HOSPITAL_COMMUNITY)
Admission: AD | Admit: 2018-12-23 | Discharge: 2018-12-25 | DRG: 806 | Disposition: A | Discharge status: Home / Self Care | Payer: Medicaid Other | Attending: Obstetrics and Gynecology | Admitting: Obstetrics and Gynecology

## 2018-12-23 ENCOUNTER — Other Ambulatory Visit: Payer: Self-pay

## 2018-12-23 ENCOUNTER — Encounter: Payer: Self-pay | Admitting: Obstetrics & Gynecology

## 2018-12-23 ENCOUNTER — Ambulatory Visit (INDEPENDENT_AMBULATORY_CARE_PROVIDER_SITE_OTHER): Payer: Medicaid Other | Admitting: Obstetrics & Gynecology

## 2018-12-23 ENCOUNTER — Encounter (HOSPITAL_COMMUNITY): Payer: Self-pay

## 2018-12-23 VITALS — BP 139/84 | HR 95 | Temp 98.1°F | Wt 237.5 lb

## 2018-12-23 DIAGNOSIS — J45909 Unspecified asthma, uncomplicated: Secondary | ICD-10-CM | POA: Diagnosis present

## 2018-12-23 DIAGNOSIS — O9952 Diseases of the respiratory system complicating childbirth: Principal | ICD-10-CM | POA: Diagnosis present

## 2018-12-23 DIAGNOSIS — O0993 Supervision of high risk pregnancy, unspecified, third trimester: Secondary | ICD-10-CM

## 2018-12-23 DIAGNOSIS — Z87891 Personal history of nicotine dependence: Secondary | ICD-10-CM

## 2018-12-23 DIAGNOSIS — O26892 Other specified pregnancy related conditions, second trimester: Secondary | ICD-10-CM | POA: Diagnosis present

## 2018-12-23 DIAGNOSIS — F331 Major depressive disorder, recurrent, moderate: Secondary | ICD-10-CM | POA: Diagnosis present

## 2018-12-23 DIAGNOSIS — Z3490 Encounter for supervision of normal pregnancy, unspecified, unspecified trimester: Secondary | ICD-10-CM

## 2018-12-23 DIAGNOSIS — O0990 Supervision of high risk pregnancy, unspecified, unspecified trimester: Secondary | ICD-10-CM

## 2018-12-23 DIAGNOSIS — O99512 Diseases of the respiratory system complicating pregnancy, second trimester: Secondary | ICD-10-CM | POA: Diagnosis present

## 2018-12-23 DIAGNOSIS — N96 Recurrent pregnancy loss: Secondary | ICD-10-CM | POA: Diagnosis present

## 2018-12-23 DIAGNOSIS — Z98891 History of uterine scar from previous surgery: Secondary | ICD-10-CM

## 2018-12-23 DIAGNOSIS — O99344 Other mental disorders complicating childbirth: Secondary | ICD-10-CM | POA: Diagnosis present

## 2018-12-23 DIAGNOSIS — Z8759 Personal history of other complications of pregnancy, childbirth and the puerperium: Secondary | ICD-10-CM

## 2018-12-23 DIAGNOSIS — F339 Major depressive disorder, recurrent, unspecified: Secondary | ICD-10-CM | POA: Diagnosis present

## 2018-12-23 DIAGNOSIS — Z1159 Encounter for screening for other viral diseases: Secondary | ICD-10-CM | POA: Diagnosis not present

## 2018-12-23 DIAGNOSIS — Z3A39 39 weeks gestation of pregnancy: Secondary | ICD-10-CM

## 2018-12-23 LAB — CBC
HCT: 33.6 % — ABNORMAL LOW (ref 36.0–46.0)
Hemoglobin: 10.8 g/dL — ABNORMAL LOW (ref 12.0–15.0)
MCH: 29.1 pg (ref 26.0–34.0)
MCHC: 32.1 g/dL (ref 30.0–36.0)
MCV: 90.6 fL (ref 80.0–100.0)
Platelets: 213 10*3/uL (ref 150–400)
RBC: 3.71 MIL/uL — ABNORMAL LOW (ref 3.87–5.11)
RDW: 12.3 % (ref 11.5–15.5)
WBC: 11 10*3/uL — ABNORMAL HIGH (ref 4.0–10.5)
nRBC: 0 % (ref 0.0–0.2)

## 2018-12-23 MED ORDER — OXYCODONE-ACETAMINOPHEN 5-325 MG PO TABS: 2.0000 | ORAL_TABLET | ORAL | Status: DC | PRN

## 2018-12-23 MED ORDER — EPHEDRINE 5 MG/ML INJ: 10.0000 mg | INTRAVENOUS | Status: DC | PRN

## 2018-12-23 MED ORDER — LACTATED RINGERS IV SOLN
500.0000 mL | Freq: Once | INTRAVENOUS | Status: AC
  Administered 2018-12-23: 500 mL via INTRAVENOUS

## 2018-12-23 MED ORDER — LACTATED RINGERS IV SOLN: 500.0000 mL | INTRAVENOUS | Status: DC | PRN

## 2018-12-23 MED ORDER — FENTANYL CITRATE (PF) 100 MCG/2ML IJ SOLN: 50.0000 ug | INTRAMUSCULAR | Status: DC | PRN

## 2018-12-23 MED ORDER — OXYTOCIN 40 UNITS IN NORMAL SALINE INFUSION - SIMPLE MED
2.5000 [IU]/h | INTRAVENOUS | Status: DC
  Administered 2018-12-24: 2.5 [IU]/h via INTRAVENOUS
  Filled 2018-12-23: qty 1000

## 2018-12-23 MED ORDER — DIPHENHYDRAMINE HCL 50 MG/ML IJ SOLN
12.5000 mg | INTRAMUSCULAR | Status: DC | PRN
  Administered 2018-12-24: 12.5 mg via INTRAVENOUS
  Filled 2018-12-23: qty 1

## 2018-12-23 MED ORDER — ACETAMINOPHEN 325 MG PO TABS: 650.0000 mg | ORAL_TABLET | ORAL | Status: DC | PRN

## 2018-12-23 MED ORDER — SOD CITRATE-CITRIC ACID 500-334 MG/5ML PO SOLN: 30.0000 mL | ORAL | Status: DC | PRN

## 2018-12-23 MED ORDER — LIDOCAINE HCL (PF) 1 % IJ SOLN: 30.0000 mL | INTRAMUSCULAR | Status: DC | PRN

## 2018-12-23 MED ORDER — FENTANYL-BUPIVACAINE-NACL 0.5-0.125-0.9 MG/250ML-% EP SOLN
12.0000 mL/h | EPIDURAL | Status: DC | PRN
  Filled 2018-12-23: qty 250

## 2018-12-23 MED ORDER — LACTATED RINGERS IV SOLN
INTRAVENOUS | Status: DC
  Administered 2018-12-23 – 2018-12-24 (×2): via INTRAVENOUS

## 2018-12-23 MED ORDER — LACTATED RINGERS IV SOLN: 500.0000 mL | Freq: Once | INTRAVENOUS | Status: DC

## 2018-12-23 MED ORDER — OXYCODONE-ACETAMINOPHEN 5-325 MG PO TABS: 1.0000 | ORAL_TABLET | ORAL | Status: DC | PRN

## 2018-12-23 MED ORDER — ONDANSETRON HCL 4 MG/2ML IJ SOLN
4.0000 mg | Freq: Four times a day (QID) | INTRAMUSCULAR | Status: DC | PRN
  Administered 2018-12-24: 4 mg via INTRAVENOUS
  Filled 2018-12-23: qty 2

## 2018-12-23 MED ORDER — PHENYLEPHRINE 40 MCG/ML (10ML) SYRINGE FOR IV PUSH (FOR BLOOD PRESSURE SUPPORT)
80.0000 ug | PREFILLED_SYRINGE | INTRAVENOUS | Status: DC | PRN
  Filled 2018-12-23: qty 10

## 2018-12-23 MED ORDER — OXYTOCIN BOLUS FROM INFUSION
500.0000 mL | Freq: Once | INTRAVENOUS | Status: AC
  Administered 2018-12-24: 500 mL via INTRAVENOUS

## 2018-12-23 MED ORDER — PHENYLEPHRINE 40 MCG/ML (10ML) SYRINGE FOR IV PUSH (FOR BLOOD PRESSURE SUPPORT): 80.0000 ug | PREFILLED_SYRINGE | INTRAVENOUS | Status: DC | PRN

## 2018-12-23 NOTE — MAU Note (Signed)
Pt reports contractions that started 2 hours ago, now every 3 mins. Reports a small amount pink spotting. Pt reports cervix was checked today and she was almost 4cm. Pt denies LOF. Reports good fetal movement.
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
## 2018-12-23 NOTE — Anesthesia Preprocedure Evaluation (Signed)
Anesthesia Evaluation  Patient identified by MRN, date of birth, ID band Patient awake    Reviewed: Allergy & Precautions, Patient's Chart, lab work & pertinent test results  Airway Mallampati: III  TM Distance: >3 FB Neck ROM: Full    Dental no notable dental hx. (+) Teeth Intact   Pulmonary asthma , former smoker,    Pulmonary exam normal breath sounds clear to auscultation       Cardiovascular negative cardio ROS Normal cardiovascular exam Rhythm:Regular Rate:Normal     Neuro/Psych  Headaches, PSYCHIATRIC DISORDERS Anxiety Depression Hx/o Panic attacks   GI/Hepatic Neg liver ROS, GERD  ,  Endo/Other  Obesity  Renal/GU negative Renal ROS  negative genitourinary   Musculoskeletal negative musculoskeletal ROS (+)   Abdominal (+) + obese,   Peds  Hematology  (+) anemia ,   Anesthesia Other Findings   Reproductive/Obstetrics (+) Pregnancy                             Anesthesia Physical Anesthesia Plan  ASA: II  Anesthesia Plan: Epidural   Post-op Pain Management:    Induction:   PONV Risk Score and Plan:   Airway Management Planned: Natural Airway  Additional Equipment:   Intra-op Plan:   Post-operative Plan:   Informed Consent: I have reviewed the patients History and Physical, chart, labs and discussed the procedure including the risks, benefits and alternatives for the proposed anesthesia with the patient or authorized representative who has indicated his/her understanding and acceptance.       Plan Discussed with: Anesthesiologist  Anesthesia Plan Comments:         Anesthesia Quick Evaluation

## 2018-12-23 NOTE — Patient Instructions (Signed)
Vaginal Birth After Cesarean Delivery    Vaginal birth after cesarean delivery (VBAC) is giving birth vaginally after previously delivering a baby through a cesarean section (C-section). A VBAC may be a safe option for you, depending on your health and other factors.  It is important to discuss VBAC with your health care provider early in your pregnancy so you can understand the risks, benefits, and options. Having these discussions early will give you time to make your birth plan.  Who are the best candidates for VBAC?  The best candidates for VBAC are women who:   Have had one or two prior cesarean deliveries, and the incision made during the delivery was horizontal (low transverse).   Do not have a vertical (classical) scar on their uterus.   Have not had a tear in the wall of their uterus (uterine rupture).   Plan to have more pregnancies.  A VBAC is also more likely to be successful:   In women who have previously given birth vaginally.   When labor starts by itself (spontaneously) before the due date.  What are the benefits of VBAC?  The benefits of delivering your baby vaginally instead of by a cesarean delivery include:   A shorter hospital stay.   A faster recovery time.   Less pain.   Avoiding risks associated with major surgery, such as infection and blood clots.   Less blood loss and less need for donated blood (transfusions).  What are the risks of VBAC?  The main risk of attempting a VBAC is that it may fail, forcing your health care provider to deliver your baby by a C-section.  Other risks are rare and include:   Tearing (rupture) of the scar from a past cesarean delivery.   Other risks associated with vaginal deliveries.  If a repeat cesarean delivery is needed, the risks include:   Blood loss.   Infection.   Blood clot.   Damage to surrounding organs.   Removal of the uterus (hysterectomy), if it is damaged.   Placenta problems in future pregnancies.  What else should I know  about my options?  Delivering a baby through a VBAC is similar to having a normal spontaneous vaginal delivery. Therefore, it is safe:   To try with twins.   For your health care provider to try to turn the baby from a breech position (external cephalic version) during labor.   With epidural analgesia for pain relief.  Consider where you would like to deliver your baby. VBAC should be attempted in facilities where an emergency cesarean delivery can be performed. VBAC is not recommended for home births.  Any changes in your health or your baby's health during your pregnancy may make it necessary to change your initial decision about VBAC. Your health care provider may recommend that you do not attempt a VBAC if:   Your baby's suspected weight is 8.8 lb (4 kg) or more.   You have preeclampsia. This is a condition that causes high blood pressure along with other symptoms, such as swelling and headaches.   You will have VBAC less than 19 months after your cesarean delivery.   You are past your due date.   You need to have labor started (induced) because your cervix is not ready for labor (unfavorable).  Where to find more information   American Pregnancy Association: americanpregnancy.org   American Congress of Obstetricians and Gynecologists: acog.org  Summary   Vaginal birth after cesarean delivery (VBAC) is giving birth   vaginally after previously delivering a baby through a cesarean section (C-section). A VBAC may be a safe option for you, depending on your health and other factors.   Discuss VBAC with your health care provider early in your pregnancy so you can understand the risks, benefits, options, and have plenty of time to make your birth plan.   The main risk of attempting a VBAC is that it may fail, forcing your health care provider to deliver your baby by a C-section. Other risks are rare.  This information is not intended to replace advice given to you by your health care provider. Make sure  you discuss any questions you have with your health care provider.  Document Released: 12/21/2006 Document Revised: 10/07/2016 Document Reviewed: 10/07/2016  Elsevier Interactive Patient Education  2019 Elsevier Inc.

## 2018-12-24 ENCOUNTER — Inpatient Hospital Stay (HOSPITAL_COMMUNITY): Payer: Medicaid Other | Admitting: Anesthesiology

## 2018-12-24 ENCOUNTER — Other Ambulatory Visit: Payer: Self-pay | Admitting: Obstetrics & Gynecology

## 2018-12-24 ENCOUNTER — Encounter (HOSPITAL_COMMUNITY): Payer: Self-pay

## 2018-12-24 DIAGNOSIS — Z3A39 39 weeks gestation of pregnancy: Secondary | ICD-10-CM

## 2018-12-24 LAB — TYPE AND SCREEN
ABO/RH(D): B POS
Antibody Screen: NEGATIVE

## 2018-12-24 LAB — ABO/RH: ABO/RH(D): B POS

## 2018-12-24 LAB — SARS CORONAVIRUS 2: SARS Coronavirus 2: NOT DETECTED

## 2018-12-24 LAB — RPR: RPR Ser Ql: NONREACTIVE

## 2018-12-24 MED ORDER — ACETAMINOPHEN 325 MG PO TABS
650.0000 mg | ORAL_TABLET | ORAL | Status: DC | PRN
  Administered 2018-12-24 – 2018-12-25 (×2): 650 mg via ORAL
  Filled 2018-12-24 (×2): qty 2

## 2018-12-24 MED ORDER — OXYTOCIN 10 UNIT/ML IJ SOLN
INTRAMUSCULAR | Status: AC
  Filled 2018-12-24: qty 1

## 2018-12-24 MED ORDER — TETANUS-DIPHTH-ACELL PERTUSSIS 5-2.5-18.5 LF-MCG/0.5 IM SUSP: 0.5000 mL | Freq: Once | INTRAMUSCULAR | Status: DC

## 2018-12-24 MED ORDER — DIPHENHYDRAMINE HCL 25 MG PO CAPS: 25.0000 mg | ORAL_CAPSULE | Freq: Four times a day (QID) | ORAL | Status: DC | PRN

## 2018-12-24 MED ORDER — ONDANSETRON HCL 4 MG PO TABS: 4.0000 mg | ORAL_TABLET | ORAL | Status: DC | PRN

## 2018-12-24 MED ORDER — DIBUCAINE (PERIANAL) 1 % EX OINT
1.0000 "application " | TOPICAL_OINTMENT | CUTANEOUS | Status: DC | PRN
  Administered 2018-12-24: 1 via RECTAL
  Filled 2018-12-24: qty 28

## 2018-12-24 MED ORDER — ONDANSETRON HCL 4 MG/2ML IJ SOLN: 4.0000 mg | INTRAMUSCULAR | Status: DC | PRN

## 2018-12-24 MED ORDER — SODIUM CHLORIDE (PF) 0.9 % IJ SOLN
INTRAMUSCULAR | Status: DC | PRN
  Administered 2018-12-24: 14 mL/h via EPIDURAL

## 2018-12-24 MED ORDER — WITCH HAZEL-GLYCERIN EX PADS
1.0000 "application " | MEDICATED_PAD | CUTANEOUS | Status: DC | PRN
  Administered 2018-12-24: 1 via TOPICAL

## 2018-12-24 MED ORDER — DOCUSATE SODIUM 100 MG PO CAPS
100.0000 mg | ORAL_CAPSULE | Freq: Two times a day (BID) | ORAL | Status: DC
  Administered 2018-12-24: 100 mg via ORAL
  Filled 2018-12-24: qty 1

## 2018-12-24 MED ORDER — LIDOCAINE HCL (PF) 1 % IJ SOLN
INTRAMUSCULAR | Status: DC | PRN
  Administered 2018-12-24: 5 mL via EPIDURAL
  Administered 2018-12-24: 4 mL via EPIDURAL

## 2018-12-24 MED ORDER — BENZOCAINE-MENTHOL 20-0.5 % EX AERO
1.0000 "application " | INHALATION_SPRAY | CUTANEOUS | Status: DC | PRN
  Administered 2018-12-24: 1 via TOPICAL
  Filled 2018-12-24: qty 56

## 2018-12-24 MED ORDER — PRENATAL MULTIVITAMIN CH
1.0000 | ORAL_TABLET | Freq: Every day | ORAL | Status: DC
  Administered 2018-12-24: 1 via ORAL
  Filled 2018-12-24: qty 1

## 2018-12-24 MED ORDER — MEASLES, MUMPS & RUBELLA VAC IJ SOLR: 0.5000 mL | Freq: Once | INTRAMUSCULAR | Status: DC

## 2018-12-24 MED ORDER — SIMETHICONE 80 MG PO CHEW: 80.0000 mg | CHEWABLE_TABLET | ORAL | Status: DC | PRN

## 2018-12-24 MED ORDER — COCONUT OIL OIL: 1.0000 "application " | TOPICAL_OIL | Status: DC | PRN

## 2018-12-24 MED ORDER — IBUPROFEN 600 MG PO TABS
600.0000 mg | ORAL_TABLET | Freq: Four times a day (QID) | ORAL | Status: DC
  Administered 2018-12-24 – 2018-12-25 (×4): 600 mg via ORAL
  Filled 2018-12-24 (×4): qty 1

## 2018-12-24 NOTE — Anesthesia Procedure Notes (Signed)
Epidural Patient location during procedure: OB Start time: 12/24/2018 12:12 AM End time: 12/24/2018 12:21 AM  Staffing Anesthesiologist: Josephine Igo, MD Performed: anesthesiologist   Preanesthetic Checklist Completed: patient identified, site marked, surgical consent, pre-op evaluation, timeout performed, IV checked, risks and benefits discussed and monitors and equipment checked  Epidural Patient position: sitting Prep: site prepped and draped and DuraPrep Patient monitoring: continuous pulse ox and blood pressure Approach: midline Location: L4-L5 Injection technique: LOR air  Needle:  Needle type: Tuohy  Needle gauge: 17 G Needle length: 9 cm and 9 Needle insertion depth: 8 cm Catheter type: closed end flexible Catheter size: 19 Gauge Catheter at skin depth: 13 cm Test dose: negative and Other  Assessment Events: blood not aspirated, injection not painful, no injection resistance, negative IV test and no paresthesia  Additional Notes Patient identified. Risks and benefits discussed including failed block, incomplete  Pain control, post dural puncture headache, nerve damage, paralysis, blood pressure Changes, nausea, vomiting, reactions to medications-both toxic and allergic and post Partum back pain. All questions were answered. Patient expressed understanding and wished to proceed. Sterile technique was used throughout procedure. Epidural site was Dressed with sterile barrier dressing. No paresthesias, signs of intravascular injection Or signs of intrathecal spread were encountered. Attempt x 2. Difficult due to poor positioning. Patient was more comfortable after the epidural was dosed. Please see RN's note for documentation of vital signs and FHR which are stable. Reason for block:procedure for pain

## 2018-12-24 NOTE — Anesthesia Postprocedure Evaluation (Signed)
Anesthesia Post Note  Patient: Jodi Daniel  Procedure(s) Performed: AN AD [place]     Patient location during evaluation: Mother Baby Anesthesia Type: Epidural Level of consciousness: awake and alert and oriented Pain management: satisfactory to patient Vital Signs Assessment: post-procedure vital signs reviewed and stable Respiratory status: spontaneous breathing and nonlabored ventilation Cardiovascular status: stable Postop Assessment: no headache, no backache, no signs of nausea or vomiting, adequate PO intake, patient able to bend at knees and able to ambulate (patient up walking) Anesthetic complications: no    Last Vitals:  Vitals:   12/24/18 0613 12/24/18 0732  BP: 130/70 126/75  Pulse: 77 75  Resp: 16 17  Temp: 37.1 C 36.8 C  SpO2: 99%     Last Pain:  Vitals:   12/24/18 0732  TempSrc: Oral  PainSc:    Pain Goal:                Epidural/Spinal Function Cutaneous sensation: Normal sensation (12/24/18 0730), Patient able to flex knees: Yes (12/24/18 0730), Patient able to lift hips off bed: Yes (12/24/18 0730), Back pain beyond tenderness at insertion site: No (12/24/18 0730), Progressively worsening motor and/or sensory loss: No (12/24/18 0730), Bowel and/or bladder incontinence post epidural: No (12/24/18 0730)  Willa Rough
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
## 2018-12-24 NOTE — Lactation Note (Signed)
This note was copied from a baby's chart. Lactation Consultation Note  Patient Name: Jodi Daniel TKPTW'S Date: 12/24/2018 Reason for consult: Initial assessment;Term  53 hours old FT female who is being exclusively BF by her mother, she's a P2 and experienced BF. Mom BF her first child for 6 months and the only reason why she stopped it's because baby started biting her. She participated in the [facility] program at [facility] (she lives with her mom).   LC revised hand expression with mom since she said she didn't know how to do it. Noticed that mom has really long acrylic nails about 1 5/6-8 inches long. Mom also has very short shafted nipples but her tissue is very compressible, she was able to get big drops of colostrum, LC rubbed them on baby's mouth, she was asleep. Mom has a DEBP and a hand pump at home.  Offered assistance with latch but mom politely declined, she said baby wasn't ready for another feeding. Asked mom to call for assistance when needed. Reviewed normal newborn behavior and feeding cues.  Feeding plan:  1. Encouraged mom to feed baby STS 8-12 times/24 hours or sooner if feeding cues are present 2. Hand expression and spoon feeding were also encouraged  BF brochure, BF resources and feeding diary were reviewed. Mom reported all questions and concerns were answered, she's aware of [facility] OP services and will call PRN.  Maternal Data Formula Feeding for Exclusion: No Has patient been taught Hand Expression?: Yes Does the patient have breastfeeding experience prior to this delivery?: Yes  Feeding    Interventions Interventions: Breast feeding basics reviewed;Hand express;Breast compression;Breast massage  Lactation Tools Discussed/Used WIC Program: Yes   Consult Status Consult Status: Follow-up Date: 12/25/18 Follow-up type: In-patient    Meghna Hagmann Francene Boyers 12/24/2018, 1:01 PM

## 2018-12-25 LAB — CBC
HCT: 28.4 % — ABNORMAL LOW (ref 36.0–46.0)
Hemoglobin: 9 g/dL — ABNORMAL LOW (ref 12.0–15.0)
MCH: 28.8 pg (ref 26.0–34.0)
MCHC: 31.7 g/dL (ref 30.0–36.0)
MCV: 90.7 fL (ref 80.0–100.0)
Platelets: 176 10*3/uL (ref 150–400)
RBC: 3.13 MIL/uL — ABNORMAL LOW (ref 3.87–5.11)
RDW: 12.4 % (ref 11.5–15.5)
WBC: 8.7 10*3/uL (ref 4.0–10.5)
nRBC: 0 % (ref 0.0–0.2)

## 2018-12-25 MED ORDER — IBUPROFEN 600 MG PO TABS: 600.0000 mg | ORAL_TABLET | Freq: Four times a day (QID) | ORAL | 0 refills | Status: DC | PRN

## 2018-12-25 NOTE — Discharge Instructions (Signed)
Vaginal Delivery, Care After °Refer to this sheet in the next few weeks. These instructions provide you with information about caring for yourself after vaginal delivery. Your health care provider may also give you more specific instructions. Your treatment has been planned according to current medical practices, but problems sometimes occur. Call your health care provider if you have any problems or questions. °What can I expect after the procedure? °After vaginal delivery, it is common to have: °· Some bleeding from your vagina. °· Soreness in your abdomen, your vagina, and the area of skin between your vaginal opening and your anus (perineum). °· Pelvic cramps. °· Fatigue. °Follow these instructions at home: °Medicines °· Take over-the-counter and prescription medicines only as told by your health care provider. °· If you were prescribed an antibiotic medicine, take it as told by your health care provider. Do not stop taking the antibiotic until it is finished. °Driving ° °· Do not drive or operate heavy machinery while taking prescription pain medicine. °· Do not drive for 24 hours if you received a sedative. °Lifestyle °· Do not drink alcohol. This is especially important if you are breastfeeding or taking medicine to relieve pain. °· Do not use tobacco products, including cigarettes, chewing tobacco, or e-cigarettes. If you need help quitting, ask your health care provider. °Eating and drinking °· Drink at least 8 eight-ounce glasses of water every day unless you are told not to by your health care provider. If you choose to breastfeed your baby, you may need to drink more water than this. °· Eat high-fiber foods every day. These foods may help prevent or relieve constipation. High-fiber foods include: °? Whole grain cereals and breads. °? Brown rice. °? Beans. °? Fresh fruits and vegetables. °Activity °· Return to your normal activities as told by your health care provider. Ask your health care provider what  activities are safe for you. °· Rest as much as possible. Try to rest or take a nap when your baby is sleeping. °· Do not lift anything that is heavier than your baby or 10 lb (4.5 kg) until your health care provider says that it is safe. °· Talk with your health care provider about when you can engage in sexual activity. This may depend on your: °? Risk of infection. °? Rate of healing. °? Comfort and desire to engage in sexual activity. °Vaginal Care °· If you have an episiotomy or a vaginal tear, check the area every day for signs of infection. Check for: °? More redness, swelling, or pain. °? More fluid or blood. °? Warmth. °? Pus or a bad smell. °· Do not use tampons or douches until your health care provider says this is safe. °· Watch for any blood clots that may pass from your vagina. These may look like clumps of dark red, brown, or black discharge. °General instructions °· Keep your perineum clean and dry as told by your health care provider. °· Wear loose, comfortable clothing. °· Wipe from front to back when you use the toilet. °· Ask your health care provider if you can shower or take a bath. If you had an episiotomy or a perineal tear during labor and delivery, your health care provider may tell you not to take baths for a certain length of time. °· Wear a bra that supports your breasts and fits you well. °· If possible, have someone help you with household activities and help care for your baby for at least a few days after you   leave the hospital. °· Keep all follow-up visits for you and your baby as told by your health care provider. This is important. °Contact a health care provider if: °· You have: °? Vaginal discharge that has a bad smell. °? Difficulty urinating. °? Pain when urinating. °? A sudden increase or decrease in the frequency of your bowel movements. °? More redness, swelling, or pain around your episiotomy or vaginal tear. °? More fluid or blood coming from your episiotomy or vaginal  tear. °? Pus or a bad smell coming from your episiotomy or vaginal tear. °? A fever. °? A rash. °? Little or no interest in activities you used to enjoy. °? Questions about caring for yourself or your baby. °· Your episiotomy or vaginal tear feels warm to the touch. °· Your episiotomy or vaginal tear is separating or does not appear to be healing. °· Your breasts are painful, hard, or turn red. °· You feel unusually sad or worried. °· You feel nauseous or you vomit. °· You pass large blood clots from your vagina. If you pass a blood clot from your vagina, save it to show to your health care provider. Do not flush blood clots down the toilet without having your health care provider look at them. °· You urinate more than usual. °· You are dizzy or light-headed. °· You have not breastfed at all and you have not had a menstrual period for 12 weeks after delivery. °· You have stopped breastfeeding and you have not had a menstrual period for 12 weeks after you stopped breastfeeding. °Get help right away if: °· You have: °? Pain that does not go away or does not get better with medicine. °? Chest pain. °? Difficulty breathing. °? Blurred vision or spots in your vision. °? Thoughts about hurting yourself or your baby. °· You develop pain in your abdomen or in one of your legs. °· You develop a severe headache. °· You faint. °· You bleed from your vagina so much that you fill two sanitary pads in one hour. °This information is not intended to replace advice given to you by your health care provider. Make sure you discuss any questions you have with your health care provider. °Document Released: 06/27/2000 Document Revised: 12/12/2015 Document Reviewed: 07/15/2015 °Elsevier Interactive Patient Education © 2019 [employer] ° °
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
## 2018-12-30 ENCOUNTER — Encounter: Payer: Medicaid Other | Admitting: Family Medicine

## 2019-01-06 ENCOUNTER — Inpatient Hospital Stay (HOSPITAL_COMMUNITY): Payer: Medicaid Other

## 2019-01-06 NOTE — Telephone Encounter (Signed)
Opened in error

## 2019-01-20 ENCOUNTER — Telehealth: Payer: Self-pay

## 2019-01-20 ENCOUNTER — Other Ambulatory Visit: Payer: Self-pay

## 2019-01-20 DIAGNOSIS — B9689 Other specified bacterial agents as the cause of diseases classified elsewhere: Secondary | ICD-10-CM

## 2019-01-20 MED ORDER — CLINDAMYCIN PHOSPHATE 2 % VA CREA: 1.0000 | TOPICAL_CREAM | Freq: Every day | VAGINAL | 0 refills | Status: DC

## 2019-01-20 NOTE — Telephone Encounter (Signed)
Patient called in a stated that she had BV and her PCP stated that she needed to take the medication and "Pump and Dump" Patient is requesting to get something she doesn't have to do that with. Per Dr.Pratt send patient Cleocin Vaginal cream. Verified patients pharmacy and advised patient to pick up her rx and to call us if she needs anything else. Pt verbalized understanding and had no questions.

## 2019-01-21 ENCOUNTER — Telehealth: Payer: Self-pay | Admitting: Advanced Practice Midwife

## 2019-01-21 NOTE — Telephone Encounter (Signed)
Appointment on 7/13 @ 10:15. No answer, left voicemail instructing patient that the visit is a mychart appointment. Patient instructed to download the mychart app before the appointment. Advised to give the office a call back if needing assistance

## 2019-01-24 ENCOUNTER — Telehealth (INDEPENDENT_AMBULATORY_CARE_PROVIDER_SITE_OTHER): Payer: Medicaid Other | Admitting: Advanced Practice Midwife

## 2019-01-24 ENCOUNTER — Other Ambulatory Visit: Payer: Self-pay

## 2019-01-24 MED ORDER — METRONIDAZOLE 500 MG PO TABS: 500.0000 mg | ORAL_TABLET | Freq: Two times a day (BID) | ORAL | 0 refills | Status: DC

## 2019-01-24 NOTE — Progress Notes (Signed)
   Jodi Daniel is a 23 y.o. female who presents for a postpartum visit. She is 5 weeks postpartum following a SVD. I have fully reviewed the prenatal and intrapartum course. The delivery was at 39.1  gestational weeks. Outcome: vaginal birth after cesarean (VBAC). Anesthesia: epidural. Postpartum course has been unremarkable. Baby's course has been unremarkable. Baby is feeding by both breast and bottle - gerber good start. Bleeding no bleeding. Bowel function is normal. Bladder function is normal. Patient is not sexually active. Contraception method is abstinence. Postpartum depression screening: Negative  Pt could not complete appointment because she was conflicted with her child's appointment so advised will call back at 1125pm.
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
## 2019-01-24 NOTE — Progress Notes (Signed)
10:14 I called Jodi Daniel for virtual visit- left message calling for virtual visit and will call again in a few minutes- please be available by phone.  Linda,RN

## 2019-02-26 ENCOUNTER — Encounter: Payer: Self-pay | Admitting: Obstetrics and Gynecology

## 2019-05-18 ENCOUNTER — Other Ambulatory Visit: Payer: Self-pay

## 2019-05-18 DIAGNOSIS — Z20822 Contact with and (suspected) exposure to covid-19: Secondary | ICD-10-CM

## 2019-05-19 LAB — NOVEL CORONAVIRUS, NAA: SARS-CoV-2, NAA: NOT DETECTED

## 2019-12-05 ENCOUNTER — Emergency Department
Admission: EM | Admit: 2019-12-05 | Discharge: 2019-12-05 | Disposition: A | Discharge status: Home / Self Care | Payer: Medicaid Other | Attending: Emergency Medicine | Admitting: Emergency Medicine

## 2019-12-05 ENCOUNTER — Other Ambulatory Visit: Payer: Self-pay

## 2019-12-05 DIAGNOSIS — Z20822 Contact with and (suspected) exposure to covid-19: Secondary | ICD-10-CM | POA: Diagnosis not present

## 2019-12-05 DIAGNOSIS — R350 Frequency of micturition: Secondary | ICD-10-CM | POA: Diagnosis present

## 2019-12-05 DIAGNOSIS — N3001 Acute cystitis with hematuria: Secondary | ICD-10-CM | POA: Diagnosis not present

## 2019-12-05 DIAGNOSIS — Z87891 Personal history of nicotine dependence: Secondary | ICD-10-CM | POA: Diagnosis not present

## 2019-12-05 DIAGNOSIS — Z79899 Other long term (current) drug therapy: Secondary | ICD-10-CM | POA: Insufficient documentation

## 2019-12-05 LAB — URINALYSIS, COMPLETE (UACMP) WITH MICROSCOPIC
Bilirubin Urine: NEGATIVE
Glucose, UA: NEGATIVE mg/dL
Ketones, ur: NEGATIVE mg/dL
Nitrite: NEGATIVE
Protein, ur: NEGATIVE mg/dL
Specific Gravity, Urine: 1.027 (ref 1.005–1.030)
pH: 5 (ref 5.0–8.0)

## 2019-12-05 LAB — CHLAMYDIA/NGC RT PCR (ARMC ONLY)
Chlamydia Tr: NOT DETECTED
N gonorrhoeae: NOT DETECTED

## 2019-12-05 LAB — SARS CORONAVIRUS 2 (TAT 6-24 HRS): SARS Coronavirus 2: NEGATIVE

## 2019-12-05 LAB — POCT PREGNANCY, URINE: Preg Test, Ur: NEGATIVE

## 2019-12-05 MED ORDER — CEPHALEXIN 500 MG PO CAPS: 500.0000 mg | ORAL_CAPSULE | Freq: Three times a day (TID) | ORAL | 0 refills | Status: AC

## 2019-12-05 MED ORDER — CEPHALEXIN 500 MG PO CAPS
500.0000 mg | ORAL_CAPSULE | Freq: Three times a day (TID) | ORAL | 0 refills | Status: DC
Start: 2019-12-05 — End: 2019-12-05
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
## 2019-12-05 NOTE — ED Triage Notes (Signed)
Pt states she wants to be checked for covid because she is having cough and congestion, pt also request to be checked for UTI and STD, states she is having painful urination and thinks she has a UTI.
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
## 2020-05-15 ENCOUNTER — Other Ambulatory Visit: Payer: Self-pay

## 2020-05-15 ENCOUNTER — Emergency Department: Payer: Medicaid Other

## 2020-05-15 ENCOUNTER — Emergency Department
Admission: EM | Admit: 2020-05-15 | Discharge: 2020-05-15 | Disposition: A | Discharge status: Home / Self Care | Payer: Medicaid Other | Attending: Emergency Medicine | Admitting: Emergency Medicine

## 2020-05-15 ENCOUNTER — Encounter: Payer: Self-pay | Admitting: *Deleted

## 2020-05-15 DIAGNOSIS — R109 Unspecified abdominal pain: Secondary | ICD-10-CM | POA: Insufficient documentation

## 2020-05-15 DIAGNOSIS — O468X1 Other antepartum hemorrhage, first trimester: Secondary | ICD-10-CM | POA: Diagnosis not present

## 2020-05-15 DIAGNOSIS — O26891 Other specified pregnancy related conditions, first trimester: Secondary | ICD-10-CM | POA: Insufficient documentation

## 2020-05-15 DIAGNOSIS — Z5321 Procedure and treatment not carried out due to patient leaving prior to being seen by health care provider: Secondary | ICD-10-CM | POA: Diagnosis not present

## 2020-05-15 DIAGNOSIS — O26851 Spotting complicating pregnancy, first trimester: Secondary | ICD-10-CM | POA: Diagnosis present

## 2020-05-15 DIAGNOSIS — O219 Vomiting of pregnancy, unspecified: Secondary | ICD-10-CM | POA: Diagnosis not present

## 2020-05-15 DIAGNOSIS — O209 Hemorrhage in early pregnancy, unspecified: Secondary | ICD-10-CM

## 2020-05-15 DIAGNOSIS — O30041 Twin pregnancy, dichorionic/diamniotic, first trimester: Secondary | ICD-10-CM | POA: Diagnosis not present

## 2020-05-15 DIAGNOSIS — Z3A01 Less than 8 weeks gestation of pregnancy: Secondary | ICD-10-CM | POA: Diagnosis not present

## 2020-05-15 DIAGNOSIS — N939 Abnormal uterine and vaginal bleeding, unspecified: Secondary | ICD-10-CM | POA: Insufficient documentation

## 2020-05-15 LAB — COMPREHENSIVE METABOLIC PANEL
ALT: 10 U/L (ref 0–44)
AST: 14 U/L — ABNORMAL LOW (ref 15–41)
Albumin: 3.6 g/dL (ref 3.5–5.0)
Alkaline Phosphatase: 48 U/L (ref 38–126)
Anion gap: 8 (ref 5–15)
BUN: 10 mg/dL (ref 6–20)
CO2: 23 mmol/L (ref 22–32)
Calcium: 8.6 mg/dL — ABNORMAL LOW (ref 8.9–10.3)
Chloride: 103 mmol/L (ref 98–111)
Creatinine, Ser: 0.69 mg/dL (ref 0.44–1.00)
GFR, Estimated: >60 mL/min (ref >60)
Glucose, Bld: 106 mg/dL — ABNORMAL HIGH (ref 70–99)
Potassium: 3.8 mmol/L (ref 3.5–5.1)
Sodium: 134 mmol/L — ABNORMAL LOW (ref 135–145)
Total Bilirubin: 0.7 mg/dL (ref 0.3–1.2)
Total Protein: 6.8 g/dL (ref 6.5–8.1)

## 2020-05-15 LAB — URINALYSIS, COMPLETE (UACMP) WITH MICROSCOPIC
Bacteria, UA: NONE SEEN
Bilirubin Urine: NEGATIVE
Glucose, UA: NEGATIVE mg/dL
Ketones, ur: NEGATIVE mg/dL
Leukocytes,Ua: NEGATIVE
Nitrite: NEGATIVE
Protein, ur: NEGATIVE mg/dL
RBC / HPF: >50 RBC/hpf — ABNORMAL HIGH (ref 0–5)
Specific Gravity, Urine: 1.023 (ref 1.005–1.030)
pH: 6 (ref 5.0–8.0)

## 2020-05-15 LAB — CBC WITH DIFFERENTIAL/PLATELET
Abs Immature Granulocytes: 0.02 10*3/uL (ref 0.00–0.07)
Basophils Absolute: 0 10*3/uL (ref 0.0–0.1)
Basophils Relative: 0 %
Eosinophils Absolute: 0 10*3/uL (ref 0.0–0.5)
Eosinophils Relative: 0 %
HCT: 35.5 % — ABNORMAL LOW (ref 36.0–46.0)
Hemoglobin: 11.7 g/dL — ABNORMAL LOW (ref 12.0–15.0)
Immature Granulocytes: 0 %
Lymphocytes Relative: 17 %
Lymphs Abs: 1.1 10*3/uL (ref 0.7–4.0)
MCH: 30.6 pg (ref 26.0–34.0)
MCHC: 33 g/dL (ref 30.0–36.0)
MCV: 92.9 fL (ref 80.0–100.0)
Monocytes Absolute: 0.4 10*3/uL (ref 0.1–1.0)
Monocytes Relative: 6 %
Neutro Abs: 5.3 10*3/uL (ref 1.7–7.7)
Neutrophils Relative %: 77 %
Platelets: 206 10*3/uL (ref 150–400)
RBC: 3.82 MIL/uL — ABNORMAL LOW (ref 3.87–5.11)
RDW: 12.5 % (ref 11.5–15.5)
WBC: 6.9 10*3/uL (ref 4.0–10.5)
nRBC: 0 % (ref 0.0–0.2)

## 2020-05-15 LAB — POC URINE PREG, ED: Preg Test, Ur: POSITIVE — AB

## 2020-05-15 LAB — HCG, QUANTITATIVE, PREGNANCY: hCG, Beta Chain, Quant, S: 189915 m[IU]/mL — ABNORMAL HIGH (ref <5)

## 2020-05-15 NOTE — ED Notes (Signed)
poct pregnancy Positive 

## 2020-05-15 NOTE — ED Triage Notes (Signed)
Pt ambulatory to triage.  Pt reports vaginal bleeding since this afternoon.  Pt had a positive home pregnancy test.    Pt has right side pain.  No urinary sx.  Pt alert.

## 2020-05-16 ENCOUNTER — Encounter (HOSPITAL_COMMUNITY): Payer: Self-pay | Admitting: Obstetrics and Gynecology

## 2020-05-16 ENCOUNTER — Inpatient Hospital Stay (HOSPITAL_COMMUNITY)
Admission: AD | Admit: 2020-05-16 | Discharge: 2020-05-16 | Disposition: A | Discharge status: Home / Self Care | Payer: Medicaid Other | Attending: Obstetrics and Gynecology | Admitting: Obstetrics and Gynecology

## 2020-05-16 ENCOUNTER — Other Ambulatory Visit: Payer: Self-pay

## 2020-05-16 DIAGNOSIS — O30049 Twin pregnancy, dichorionic/diamniotic, unspecified trimester: Secondary | ICD-10-CM

## 2020-05-16 DIAGNOSIS — O219 Vomiting of pregnancy, unspecified: Secondary | ICD-10-CM

## 2020-05-16 DIAGNOSIS — O26851 Spotting complicating pregnancy, first trimester: Secondary | ICD-10-CM | POA: Insufficient documentation

## 2020-05-16 DIAGNOSIS — Z87891 Personal history of nicotine dependence: Secondary | ICD-10-CM | POA: Diagnosis not present

## 2020-05-16 DIAGNOSIS — O468X1 Other antepartum hemorrhage, first trimester: Secondary | ICD-10-CM

## 2020-05-16 DIAGNOSIS — O418X11 Other specified disorders of amniotic fluid and membranes, first trimester, fetus 1: Secondary | ICD-10-CM | POA: Diagnosis not present

## 2020-05-16 DIAGNOSIS — Z3A01 Less than 8 weeks gestation of pregnancy: Secondary | ICD-10-CM | POA: Insufficient documentation

## 2020-05-16 DIAGNOSIS — O30041 Twin pregnancy, dichorionic/diamniotic, first trimester: Secondary | ICD-10-CM | POA: Diagnosis not present

## 2020-05-16 HISTORY — DX: Twin pregnancy, dichorionic/diamniotic, unspecified trimester: O30.049

## 2020-05-16 LAB — URINALYSIS, ROUTINE W REFLEX MICROSCOPIC
Bacteria, UA: NONE SEEN
Bilirubin Urine: NEGATIVE
Glucose, UA: NEGATIVE mg/dL
Ketones, ur: 5 mg/dL — AB
Leukocytes,Ua: NEGATIVE
Nitrite: NEGATIVE
Protein, ur: NEGATIVE mg/dL
Specific Gravity, Urine: 1.023 (ref 1.005–1.030)
pH: 6 (ref 5.0–8.0)

## 2020-05-16 LAB — WET PREP, GENITAL
Clue Cells Wet Prep HPF POC: NONE SEEN
Sperm: NONE SEEN
Trich, Wet Prep: NONE SEEN
Yeast Wet Prep HPF POC: NONE SEEN

## 2020-05-16 MED ORDER — PROCHLORPERAZINE MALEATE 10 MG PO TABS: 10.0000 mg | ORAL_TABLET | Freq: Two times a day (BID) | ORAL | 0 refills | Status: DC | PRN

## 2020-05-16 MED ORDER — PROCHLORPERAZINE MALEATE 10 MG PO TABS
10.0000 mg | ORAL_TABLET | Freq: Once | ORAL | Status: AC
  Administered 2020-05-16: 10 mg via ORAL
  Filled 2020-05-16: qty 1

## 2020-05-16 NOTE — MAU Note (Signed)
Presents with c/o VB, states VB same amount as yesterday, requires sanitary napkin, changing pad once every 3 hours.  Left hospital prior to completing visit d/t wait time.  Also reports inability to keep anything down since yesterday.
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
## 2020-05-17 LAB — GC/CHLAMYDIA PROBE AMP (~~LOC~~) NOT AT ARMC
Chlamydia: NEGATIVE
Comment: NEGATIVE
Comment: NORMAL
Neisseria Gonorrhea: NEGATIVE

## 2020-05-29 ENCOUNTER — Encounter (HOSPITAL_COMMUNITY): Payer: Self-pay | Admitting: Obstetrics and Gynecology

## 2020-05-29 ENCOUNTER — Inpatient Hospital Stay (HOSPITAL_COMMUNITY)
Admission: AD | Admit: 2020-05-29 | Discharge: 2020-05-29 | Disposition: A | Discharge status: Home / Self Care | Payer: Medicaid Other | Attending: Obstetrics and Gynecology | Admitting: Obstetrics and Gynecology

## 2020-05-29 ENCOUNTER — Other Ambulatory Visit: Payer: Self-pay

## 2020-05-29 DIAGNOSIS — Z87891 Personal history of nicotine dependence: Secondary | ICD-10-CM | POA: Insufficient documentation

## 2020-05-29 DIAGNOSIS — Z3A09 9 weeks gestation of pregnancy: Secondary | ICD-10-CM | POA: Diagnosis not present

## 2020-05-29 DIAGNOSIS — O208 Other hemorrhage in early pregnancy: Secondary | ICD-10-CM | POA: Diagnosis present

## 2020-05-29 DIAGNOSIS — Z7951 Long term (current) use of inhaled steroids: Secondary | ICD-10-CM | POA: Insufficient documentation

## 2020-05-29 DIAGNOSIS — Z6790 Unspecified blood type, Rh positive: Secondary | ICD-10-CM

## 2020-05-29 DIAGNOSIS — O99511 Diseases of the respiratory system complicating pregnancy, first trimester: Secondary | ICD-10-CM | POA: Insufficient documentation

## 2020-05-29 DIAGNOSIS — O30041 Twin pregnancy, dichorionic/diamniotic, first trimester: Secondary | ICD-10-CM | POA: Insufficient documentation

## 2020-05-29 DIAGNOSIS — J45909 Unspecified asthma, uncomplicated: Secondary | ICD-10-CM | POA: Diagnosis not present

## 2020-05-29 DIAGNOSIS — O468X1 Other antepartum hemorrhage, first trimester: Secondary | ICD-10-CM

## 2020-05-29 DIAGNOSIS — O418X10 Other specified disorders of amniotic fluid and membranes, first trimester, not applicable or unspecified: Secondary | ICD-10-CM | POA: Diagnosis not present

## 2020-05-29 LAB — URINALYSIS, ROUTINE W REFLEX MICROSCOPIC
Bilirubin Urine: NEGATIVE
Glucose, UA: NEGATIVE mg/dL
Hgb urine dipstick: NEGATIVE
Ketones, ur: NEGATIVE mg/dL
Leukocytes,Ua: NEGATIVE
Nitrite: NEGATIVE
Protein, ur: NEGATIVE mg/dL
Specific Gravity, Urine: 1.019 (ref 1.005–1.030)
pH: 6 (ref 5.0–8.0)

## 2020-05-29 NOTE — Discharge Instructions (Signed)
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
## 2020-05-29 NOTE — MAU Provider Note (Addendum)
First Provider Initiated Contact with Patient 05/29/20 1715     S [name] is a 24 y.o. @ [redacted]w[redacted]d [phone] pregnant female who presents to MAU today with complaint of complaints of vaginal bleeding. Patient was seen for similar complaint on 11/03 and found to have moderate subchorionic hemorrhage. Since then, she states the bleeding has become more dark in appearance and less in quantity. Does state she has noticed a different odor to the bleeding. Describes the odor as "machine like", not fishy. Denies any vaginal discharge, recent sexual activity, vaginal itching or vaginal pain. Is having intermittent lower abdominal cramping that does seem to improve with tylenol.   ROS: Denies SOB, Headache, CP, dysuria  O BP 119/65 (BP Location: Right Arm)    Pulse 86    Temp 97.7 F (36.5 C) (Oral)    Resp 16    LMP 03/22/2020 (Approximate)    SpO2 100%    Physical Exam Constitutional:      Appearance: Normal appearance.  HENT:     Head: Normocephalic.  Eyes:     Conjunctiva/sclera: Conjunctivae normal.  Cardiovascular:     Rate and Rhythm: Normal rate.  Pulmonary:     Effort: Pulmonary effort is normal.  Abdominal:     Palpations: Abdomen is soft.     Tenderness: There is abdominal tenderness. There is no guarding or rebound.     Comments: Diffuse abdominal tenderness throughout all four quadrants.  Musculoskeletal:        General: Normal range of motion.     Cervical back: Normal range of motion and neck supple.  Skin:    General: Skin is warm.     Findings: No rash.  Neurological:     General: No focal deficit present.     Mental Status: She is alert.  Psychiatric:        Mood and Affect: Mood normal.    A 24 yo G2I9485 @ [redacted]w[redacted]d presenting for vaginal bleeding. Diagnosed with moderate volume subchorionic hemorrhage 2 weeks ago. TVUS done on 11/02 otherwise indicating a live twin (dichorionic-diamniotic) IUP. Bleeding is less and has become darker since, which is reassuring for  likely resolving subchorionic hemorrhage. FHT today with reassuring cardiac activity x2. Will obtain UA today to r/o infection. Did have negative G/C two weeks ago.   Medical screening exam complete.  P Discharge from MAU in stable condition Reassured patient. UA pending, will f/u and notify patient of results. Has prenatal visit f/u at beginning of December.  Warning signs for worsening condition that would warrant emergency follow-up discussed Patient may return to MAU as needed for emergent OB/GYN related complaints  Trula Slade, MD PGY-1 05/29/2020 5:33 PM    Attestation of Supervision of Student:  I confirm that I have verified the information documented in the medical students note and that I have also personally reperformed the history, physical exam and all medical decision making activities.  I have verified that all services and findings are accurately documented in this student's note; and I agree with management and plan as outlined in the documentation. I have also made any necessary editorial changes.  Please see provider note for billing purposes. Entire visit was reperformed by provider.  Marylen Ponto, NP [facility] [employer], [facility] 06/01/2020 8:24 AM

## 2020-05-29 NOTE — MAU Note (Signed)
Pt reports back cramps that started yesterday. Pt reports dark blood with a different odor and is having clots.

## 2020-05-30 LAB — CULTURE, OB URINE: Culture: NO GROWTH

## 2020-06-11 ENCOUNTER — Other Ambulatory Visit: Payer: Self-pay

## 2020-06-11 ENCOUNTER — Telehealth (INDEPENDENT_AMBULATORY_CARE_PROVIDER_SITE_OTHER): Payer: Medicaid Other | Admitting: *Deleted

## 2020-06-11 DIAGNOSIS — O0990 Supervision of high risk pregnancy, unspecified, unspecified trimester: Secondary | ICD-10-CM

## 2020-06-11 DIAGNOSIS — O30049 Twin pregnancy, dichorionic/diamniotic, unspecified trimester: Secondary | ICD-10-CM

## 2020-06-11 DIAGNOSIS — O09899 Supervision of other high risk pregnancies, unspecified trimester: Secondary | ICD-10-CM

## 2020-06-11 DIAGNOSIS — Z98891 History of uterine scar from previous surgery: Secondary | ICD-10-CM

## 2020-06-11 HISTORY — DX: Supervision of other high risk pregnancies, unspecified trimester: O09.899

## 2020-06-11 NOTE — Progress Notes (Signed)
1:15 Kenyanna not connected virtually for her appointment. I called and asked her to join me for her virtual appointments. I gave her instructions on how to join. Jaymari Cromie,RN  New OB Intake  I connected with  Truett Perna on 06/11/20 at 1:20 by MyChart and verified that I am speaking with the correct person using two identifiers. Nurse is located at [facility] and pt is located at home.  I discussed the limitations, risks, security and privacy concerns of performing an evaluation and management service by telephone and the availability of in person appointments. I also discussed with the patient that there may be a patient responsible charge related to this service. The patient expressed understanding and agreed to proceed.  I explained I am completing New OB Intake today. We discussed her EDD of 12/27/2020 that is based on LMP of 03/22/20. Pt is G5/P1122. I reviewed her allergies, medications, Medical/Surgical/OB history, and appropriate screenings. I informed her of [facility] services. Based on history, this is a/an complicated by Di/di twin pregnancy and history of Preterm delivery at 35 weeks from abruptio placenta  Concerns addressed today  MyChart/Babyscripts MyChart access verified. I explained pt will have some visits in office and some virtually. Babyscripts instructions given.   Blood Pressure Cuff  Patient received blood pressure cuff with 2020 Pregnancy. She will look to see if she still has it; if not- she will let us know at her new ob appointment. I Explained after first prenatal appt pt will check weekly and document in Babyscripts.  Anatomy US Explained first scheduled [place] will be around 19 weeks. Anatomy US scheduled for 08/02/20 at 1015. Pt notified to arrive at 1000.  WIC Patient already has WIC.   COVID Vaccine Patient has not had vaccine.   Labs Discussed Avelina Laine genetic screening with patient. Would like  Panorama drawn at new OB visit. Routine prenatal labs needed.Has  already had GC and Urine culture done in MAU. Had pap in 2019.   First visit review I reviewed new OB appt with pt. I explained she will have a pelvic exam, ob bloodwork with genetic screening. Explained pt will be seen by Dr. Macon Large at first visit; encounter routed to appropriate provider.  Kiron Osmun,RN 06/11/2020  1:18 PM

## 2020-06-11 NOTE — Progress Notes (Signed)
Patient was assessed and managed by nursing staff during this encounter. I have reviewed the chart and agree with the documentation and plan. I have also made any necessary editorial changes.  Jaynie Collins, MD 06/11/2020 3:27 PM

## 2020-06-11 NOTE — Patient Instructions (Signed)
°  At [facility] at [facility], we work as an integrated team, providing care to address both physical and emotional health. Your medical provider may refer you to see our Behavioral Health Clinician [facility]) on the same day you see your medical provider, as availability permits.  Our [facility] is available to all patients, visits generally last between 20-30 minutes, but can be longer or shorter, depending on patient need. The [facility] offers help with stress management, coping with symptoms of depression and anxiety, major life changes , sleep issues, changing risky behavior, grief and loss, life stress, working on personal life goals, and  behavioral health issues, as these all affect your overall health and wellness.  The [facility] is NOT available for the following: FMLA paperwork, court-ordered evaluations, specialty assessments (custody or disability), letters to employers, or obtaining certification for an emotional support animal. The [facility] does not provide long-term therapy. You have the right to refuse integrated behavioral health services, or to reschedule to see the [facility] at a later date.  Exception: If you are having thoughts of suicide, we require that you either see the [facility] for further assessment, or contract for safety with your medical provider. Confidentiality exception: If it is suspected that a child or disabled adult is being abused or neglected, we are required by law to report that to either Child Protective Services or Adult Management consultant.  If you have a diagnosis of Bipolar affective disorder, Schizophrenia, or recurrent Major depressive disorder, we will recommend that you establish care with a psychiatrist, as these are lifelong, chronic conditions, and we want your overall emotional health and medications to be more closely monitored. If you anticipate needing extended maternity leave due to mental illness, it it recommended you inform your medical provider, so  we can put in a referral to a  psychiatrist as soon as possible. The [facility] is unable to recommend an extended maternity leave for mental health issues. Your medical provider or [facility] may refer you to a therapist for ongoing, traditional therapy, or to a psychiatrist, for medication management, if it would benefit your overall health. Depending on your insurance, you may have a copay to see the [facility]. If you are uninsured, it is recommended that you apply for financial assistance. (Forms may be requested at the front desk for in-person visits, via MyChart, or request a form during a virtual visit).  If you see the [facility] more than 6 times, you will have to complete a comprehensive clinical assessment interview with the [facility] to resume integrated services.  For virtual visits with the [facility], you must be physically in the state of [place] at the time of the visit. For example, if you live in [place], you will have to do an in-person visit with the [facility]. If you are going out of the state or country for any reason, the [facility] may see you virtually when you return to [place], but not while you are physically outside of [place].

## 2020-06-14 ENCOUNTER — Ambulatory Visit (INDEPENDENT_AMBULATORY_CARE_PROVIDER_SITE_OTHER): Payer: Medicaid Other | Admitting: Obstetrics & Gynecology

## 2020-06-14 ENCOUNTER — Other Ambulatory Visit: Payer: Self-pay

## 2020-06-14 ENCOUNTER — Other Ambulatory Visit (HOSPITAL_COMMUNITY)
Admission: RE | Admit: 2020-06-14 | Discharge: 2020-06-14 | Disposition: A | Discharge status: Home / Self Care | Payer: Medicaid Other | Source: Ambulatory Visit | Attending: Obstetrics & Gynecology | Admitting: Obstetrics & Gynecology

## 2020-06-14 ENCOUNTER — Encounter: Payer: Self-pay | Admitting: Obstetrics & Gynecology

## 2020-06-14 VITALS — BP 114/73 | HR 79 | Wt 208.0 lb

## 2020-06-14 DIAGNOSIS — Z98891 History of uterine scar from previous surgery: Secondary | ICD-10-CM

## 2020-06-14 DIAGNOSIS — O26899 Other specified pregnancy related conditions, unspecified trimester: Secondary | ICD-10-CM

## 2020-06-14 DIAGNOSIS — O09899 Supervision of other high risk pregnancies, unspecified trimester: Secondary | ICD-10-CM

## 2020-06-14 DIAGNOSIS — O0990 Supervision of high risk pregnancy, unspecified, unspecified trimester: Secondary | ICD-10-CM | POA: Insufficient documentation

## 2020-06-14 DIAGNOSIS — O418X10 Other specified disorders of amniotic fluid and membranes, first trimester, not applicable or unspecified: Secondary | ICD-10-CM

## 2020-06-14 DIAGNOSIS — F419 Anxiety disorder, unspecified: Secondary | ICD-10-CM

## 2020-06-14 DIAGNOSIS — Z3A12 12 weeks gestation of pregnancy: Secondary | ICD-10-CM

## 2020-06-14 DIAGNOSIS — O468X1 Other antepartum hemorrhage, first trimester: Secondary | ICD-10-CM

## 2020-06-14 DIAGNOSIS — R519 Headache, unspecified: Secondary | ICD-10-CM

## 2020-06-14 DIAGNOSIS — Z8759 Personal history of other complications of pregnancy, childbirth and the puerperium: Secondary | ICD-10-CM

## 2020-06-14 DIAGNOSIS — O30041 Twin pregnancy, dichorionic/diamniotic, first trimester: Secondary | ICD-10-CM

## 2020-06-14 DIAGNOSIS — F32A Depression, unspecified: Secondary | ICD-10-CM

## 2020-06-14 DIAGNOSIS — O23591 Infection of other part of genital tract in pregnancy, first trimester: Secondary | ICD-10-CM

## 2020-06-14 DIAGNOSIS — O219 Vomiting of pregnancy, unspecified: Secondary | ICD-10-CM

## 2020-06-14 MED ORDER — PROMETHAZINE HCL 25 MG PO TABS: 25.0000 mg | ORAL_TABLET | Freq: Four times a day (QID) | ORAL | 2 refills | Status: DC | PRN

## 2020-06-14 MED ORDER — METRONIDAZOLE 500 MG PO TABS: 500.0000 mg | ORAL_TABLET | Freq: Two times a day (BID) | ORAL | 0 refills | Status: AC

## 2020-06-14 MED ORDER — ONDANSETRON 4 MG PO TBDP: 4.0000 mg | ORAL_TABLET | Freq: Four times a day (QID) | ORAL | 0 refills | Status: DC | PRN

## 2020-06-14 NOTE — Patient Instructions (Signed)
First Trimester of Pregnancy The first trimester of pregnancy is from week 1 until the end of week 13 (months 1 through 3). A week after a sperm fertilizes an egg, the egg will implant on the wall of the uterus. This embryo will begin to develop into a baby. Genes from you and your partner will form the baby. The female genes will determine whether the baby will be a boy or a girl. At 6-8 weeks, the eyes and face will be formed, and the heartbeat can be seen on ultrasound. At the end of 12 weeks, all the baby's organs will be formed. Now that you are pregnant, you will want to do everything you can to have a healthy baby. Two of the most important things are to get good prenatal care and to follow your health care provider's instructions. Prenatal care is all the medical care you receive before the baby's birth. This care will help prevent, find, and treat any problems during the pregnancy and childbirth. Body changes during your first trimester Your body goes through many changes during pregnancy. The changes vary from woman to woman.  You may gain or lose a couple of pounds at first.  You may feel sick to your stomach (nauseous) and you may throw up (vomit). If the vomiting is uncontrollable, call your health care provider.  You may tire easily.  You may develop headaches that can be relieved by medicines. All medicines should be approved by your health care provider.  You may urinate more often. Painful urination may mean you have a bladder infection.  You may develop heartburn as a result of your pregnancy.  You may develop constipation because certain hormones are causing the muscles that push stool through your intestines to slow down.  You may develop hemorrhoids or swollen veins (varicose veins).  Your breasts may begin to grow larger and become tender. Your nipples may stick out more, and the tissue that surrounds them (areola) may become darker.  Your gums may bleed and may be  sensitive to brushing and flossing.  Dark spots or blotches (chloasma, mask of pregnancy) may develop on your face. This will likely fade after the baby is born.  Your menstrual periods will stop.  You may have a loss of appetite.  You may develop cravings for certain kinds of food.  You may have changes in your emotions from day to day, such as being excited to be pregnant or being concerned that something may go wrong with the pregnancy and baby.  You may have more vivid and strange dreams.  You may have changes in your hair. These can include thickening of your hair, rapid growth, and changes in texture. Some women also have hair loss during or after pregnancy, or hair that feels dry or thin. Your hair will most likely return to normal after your baby is born. What to expect at prenatal visits During a routine prenatal visit:  You will be weighed to make sure you and the baby are growing normally.  Your blood pressure will be taken.  Your abdomen will be measured to track your baby's growth.  The fetal heartbeat will be listened to between weeks 10 and 14 of your pregnancy.  Test results from any previous visits will be discussed. Your health care provider may ask you:  How you are feeling.  If you are feeling the baby move.  If you have had any abnormal symptoms, such as leaking fluid, bleeding, severe headaches, or abdominal   cramping.  If you are using any tobacco products, including cigarettes, chewing tobacco, and electronic cigarettes.  If you have any questions. Other tests that may be performed during your first trimester include:  Blood tests to find your blood type and to check for the presence of any previous infections. The tests will also be used to check for low iron levels (anemia) and protein on red blood cells (Rh antibodies). Depending on your risk factors, or if you previously had diabetes during pregnancy, you may have tests to check for high blood sugar  that affects pregnant women (gestational diabetes).  Urine tests to check for infections, diabetes, or protein in the urine.  An ultrasound to confirm the proper growth and development of the baby.  Fetal screens for spinal cord problems (spina bifida) and Down syndrome.  HIV (human immunodeficiency virus) testing. Routine prenatal testing includes screening for HIV, unless you choose not to have this test.  You may need other tests to make sure you and the baby are doing well. Follow these instructions at home: Medicines  Follow your health care provider's instructions regarding medicine use. Specific medicines may be either safe or unsafe to take during pregnancy.  Take a prenatal vitamin that contains at least 600 micrograms (mcg) of folic acid.  If you develop constipation, try taking a stool softener if your health care provider approves. Eating and drinking   Eat a balanced diet that includes fresh fruits and vegetables, whole grains, good sources of protein such as meat, eggs, or tofu, and low-fat dairy. Your health care provider will help you determine the amount of weight gain that is right for you.  Avoid raw meat and uncooked cheese. These carry germs that can cause birth defects in the baby.  Eating four or five small meals rather than three large meals a day may help relieve nausea and vomiting. If you start to feel nauseous, eating a few soda crackers can be helpful. Drinking liquids between meals, instead of during meals, also seems to help ease nausea and vomiting.  Limit foods that are high in fat and processed sugars, such as fried and sweet foods.  To prevent constipation: ? Eat foods that are high in fiber, such as fresh fruits and vegetables, whole grains, and beans. ? Drink enough fluid to keep your urine clear or pale yellow. Activity  Exercise only as directed by your health care provider. Most women can continue their usual exercise routine during  pregnancy. Try to exercise for 30 minutes at least 5 days a week. Exercising will help you: ? Control your weight. ? Stay in shape. ? Be prepared for labor and delivery.  Experiencing pain or cramping in the lower abdomen or lower back is a good sign that you should stop exercising. Check with your health care provider before continuing with normal exercises.  Try to avoid standing for long periods of time. Move your legs often if you must stand in one place for a long time.  Avoid heavy lifting.  Wear low-heeled shoes and practice good posture.  You may continue to have sex unless your health care provider tells you not to. Relieving pain and discomfort  Wear a good support bra to relieve breast tenderness.  Take warm sitz baths to soothe any pain or discomfort caused by hemorrhoids. Use hemorrhoid cream if your health care provider approves.  Rest with your legs elevated if you have leg cramps or low back pain.  If you develop varicose veins in   your legs, wear support hose. Elevate your feet for 15 minutes, 3-4 times a day. Limit salt in your diet. Prenatal care  Schedule your prenatal visits by the twelfth week of pregnancy. They are usually scheduled monthly at first, then more often in the last 2 months before delivery.  Write down your questions. Take them to your prenatal visits.  Keep all your prenatal visits as told by your health care provider. This is important. Safety  Wear your seat belt at all times when driving.  Make a list of emergency phone numbers, including numbers for family, friends, the hospital, and police and fire departments. General instructions  Ask your health care provider for a referral to a local prenatal education class. Begin classes no later than the beginning of month 6 of your pregnancy.  Ask for help if you have counseling or nutritional needs during pregnancy. Your health care provider can offer advice or refer you to specialists for help  with various needs.  Do not use hot tubs, steam rooms, or saunas.  Do not douche or use tampons or scented sanitary pads.  Do not cross your legs for long periods of time.  Avoid cat litter boxes and soil used by cats. These carry germs that can cause birth defects in the baby and possibly loss of the fetus by miscarriage or stillbirth.  Avoid all smoking, herbs, alcohol, and medicines not prescribed by your health care provider. Chemicals in these products affect the formation and growth of the baby.  Do not use any products that contain nicotine or tobacco, such as cigarettes and e-cigarettes. If you need help quitting, ask your health care provider. You may receive counseling support and other resources to help you quit.  Schedule a dentist appointment. At home, brush your teeth with a soft toothbrush and be gentle when you floss. Contact a health care provider if:  You have dizziness.  You have mild pelvic cramps, pelvic pressure, or nagging pain in the abdominal area.  You have persistent nausea, vomiting, or diarrhea.  You have a bad smelling vaginal discharge.  You have pain when you urinate.  You notice increased swelling in your face, hands, legs, or ankles.  You are exposed to fifth disease or chickenpox.  You are exposed to German measles (rubella) and have never had it. Get help right away if:  You have a fever.  You are leaking fluid from your vagina.  You have spotting or bleeding from your vagina.  You have severe abdominal cramping or pain.  You have rapid weight gain or loss.  You vomit blood or material that looks like coffee grounds.  You develop a severe headache.  You have shortness of breath.  You have any kind of trauma, such as from a fall or a car accident. Summary  The first trimester of pregnancy is from week 1 until the end of week 13 (months 1 through 3).  Your body goes through many changes during pregnancy. The changes vary from  woman to woman.  You will have routine prenatal visits. During those visits, your health care provider will examine you, discuss any test results you may have, and talk with you about how you are feeling. This information is not intended to replace advice given to you by your health care provider. Make sure you discuss any questions you have with your health care provider. Document Revised: 06/12/2017 Document Reviewed: 06/11/2016 [employer] Patient Education  2020 [employer]    Second Trimester of   Pregnancy The second trimester is from week 14 through week 27 (months 4 through 6). The second trimester is often a time when you feel your best. Your body has adjusted to being pregnant, and you begin to feel better physically. Usually, morning sickness has lessened or quit completely, you may have more energy, and you may have an increase in appetite. The second trimester is also a time when the fetus is growing rapidly. At the end of the sixth month, the fetus is about 9 inches long and weighs about 1 pounds. You will likely begin to feel the baby move (quickening) between 16 and 20 weeks of pregnancy. Body changes during your second trimester Your body continues to go through many changes during your second trimester. The changes vary from woman to woman.  Your weight will continue to increase. You will notice your lower abdomen bulging out.  You may begin to get stretch marks on your hips, abdomen, and breasts.  You may develop headaches that can be relieved by medicines. The medicines should be approved by your health care provider.  You may urinate more often because the fetus is pressing on your bladder.  You may develop or continue to have heartburn as a result of your pregnancy.  You may develop constipation because certain hormones are causing the muscles that push waste through your intestines to slow down.  You may develop hemorrhoids or swollen, bulging veins (varicose  veins).  You may have back pain. This is caused by: ? Weight gain. ? Pregnancy hormones that are relaxing the joints in your pelvis. ? A shift in weight and the muscles that support your balance.  Your breasts will continue to grow and they will continue to become tender.  Your gums may bleed and may be sensitive to brushing and flossing.  Dark spots or blotches (chloasma, mask of pregnancy) may develop on your face. This will likely fade after the baby is born.  A dark line from your belly button to the pubic area (linea nigra) may appear. This will likely fade after the baby is born.  You may have changes in your hair. These can include thickening of your hair, rapid growth, and changes in texture. Some women also have hair loss during or after pregnancy, or hair that feels dry or thin. Your hair will most likely return to normal after your baby is born. What to expect at prenatal visits During a routine prenatal visit:  You will be weighed to make sure you and the fetus are growing normally.  Your blood pressure will be taken.  Your abdomen will be measured to track your baby's growth.  The fetal heartbeat will be listened to.  Any test results from the previous visit will be discussed. Your health care provider may ask you:  How you are feeling.  If you are feeling the baby move.  If you have had any abnormal symptoms, such as leaking fluid, bleeding, severe headaches, or abdominal cramping.  If you are using any tobacco products, including cigarettes, chewing tobacco, and electronic cigarettes.  If you have any questions. Other tests that may be performed during your second trimester include:  Blood tests that check for: ? Low iron levels (anemia). ? High blood sugar that affects pregnant women (gestational diabetes) between 24 and 28 weeks. ? Rh antibodies. This is to check for a protein on red blood cells (Rh factor).  Urine tests to check for infections,  diabetes, or protein in the urine.    An ultrasound to confirm the proper growth and development of the baby.  An amniocentesis to check for possible genetic problems.  Fetal screens for spina bifida and Down syndrome.  HIV (human immunodeficiency virus) testing. Routine prenatal testing includes screening for HIV, unless you choose not to have this test. Follow these instructions at home: Medicines  Follow your health care provider's instructions regarding medicine use. Specific medicines may be either safe or unsafe to take during pregnancy.  Take a prenatal vitamin that contains at least 600 micrograms (mcg) of folic acid.  If you develop constipation, try taking a stool softener if your health care provider approves. Eating and drinking   Eat a balanced diet that includes fresh fruits and vegetables, whole grains, good sources of protein such as meat, eggs, or tofu, and low-fat dairy. Your health care provider will help you determine the amount of weight gain that is right for you.  Avoid raw meat and uncooked cheese. These carry germs that can cause birth defects in the baby.  If you have low calcium intake from food, talk to your health care provider about whether you should take a daily calcium supplement.  Limit foods that are high in fat and processed sugars, such as fried and sweet foods.  To prevent constipation: ? Drink enough fluid to keep your urine clear or pale yellow. ? Eat foods that are high in fiber, such as fresh fruits and vegetables, whole grains, and beans. Activity  Exercise only as directed by your health care provider. Most women can continue their usual exercise routine during pregnancy. Try to exercise for 30 minutes at least 5 days a week. Stop exercising if you experience uterine contractions.  Avoid heavy lifting, wear low heel shoes, and practice good posture.  A sexual relationship may be continued unless your health care provider directs you  otherwise. Relieving pain and discomfort  Wear a good support bra to prevent discomfort from breast tenderness.  Take warm sitz baths to soothe any pain or discomfort caused by hemorrhoids. Use hemorrhoid cream if your health care provider approves.  Rest with your legs elevated if you have leg cramps or low back pain.  If you develop varicose veins, wear support hose. Elevate your feet for 15 minutes, 3-4 times a day. Limit salt in your diet. Prenatal Care  Write down your questions. Take them to your prenatal visits.  Keep all your prenatal visits as told by your health care provider. This is important. Safety  Wear your seat belt at all times when driving.  Make a list of emergency phone numbers, including numbers for family, friends, the hospital, and police and fire departments. General instructions  Ask your health care provider for a referral to a local prenatal education class. Begin classes no later than the beginning of month 6 of your pregnancy.  Ask for help if you have counseling or nutritional needs during pregnancy. Your health care provider can offer advice or refer you to specialists for help with various needs.  Do not use hot tubs, steam rooms, or saunas.  Do not douche or use tampons or scented sanitary pads.  Do not cross your legs for long periods of time.  Avoid cat litter boxes and soil used by cats. These carry germs that can cause birth defects in the baby and possibly loss of the fetus by miscarriage or stillbirth.  Avoid all smoking, herbs, alcohol, and unprescribed drugs. Chemicals in these products can affect the formation and growth of   the baby.  Do not use any products that contain nicotine or tobacco, such as cigarettes and e-cigarettes. If you need help quitting, ask your health care provider.  Visit your dentist if you have not gone yet during your pregnancy. Use a soft toothbrush to brush your teeth and be gentle when you floss. Contact a  health care provider if:  You have dizziness.  You have mild pelvic cramps, pelvic pressure, or nagging pain in the abdominal area.  You have persistent nausea, vomiting, or diarrhea.  You have a bad smelling vaginal discharge.  You have pain when you urinate. Get help right away if:  You have a fever.  You are leaking fluid from your vagina.  You have spotting or bleeding from your vagina.  You have severe abdominal cramping or pain.  You have rapid weight gain or weight loss.  You have shortness of breath with chest pain.  You notice sudden or extreme swelling of your face, hands, ankles, feet, or legs.  You have not felt your baby move in over an hour.  You have severe headaches that do not go away when you take medicine.  You have vision changes. Summary  The second trimester is from week 14 through week 27 (months 4 through 6). It is also a time when the fetus is growing rapidly.  Your body goes through many changes during pregnancy. The changes vary from woman to woman.  Avoid all smoking, herbs, alcohol, and unprescribed drugs. These chemicals affect the formation and growth your baby.  Do not use any tobacco products, such as cigarettes, chewing tobacco, and e-cigarettes. If you need help quitting, ask your health care provider.  Contact your health care provider if you have any questions. Keep all prenatal visits as told by your health care provider. This is important. This information is not intended to replace advice given to you by your health care provider. Make sure you discuss any questions you have with your health care provider. Document Revised: 10/22/2018 Document Reviewed: 08/05/2016 [employer] Patient Education  2020 [employer]   
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
## 2020-06-15 ENCOUNTER — Telehealth: Payer: Self-pay | Admitting: Clinical

## 2020-06-15 ENCOUNTER — Encounter: Payer: Self-pay | Admitting: *Deleted

## 2020-06-15 LAB — CBC/D/PLT+RPR+RH+ABO+RUB AB...
Antibody Screen: NEGATIVE
Basophils Absolute: 0 10*3/uL (ref 0.0–0.2)
Basos: 1 %
EOS (ABSOLUTE): 0.1 10*3/uL (ref 0.0–0.4)
Eos: 1 %
HCV Ab: 0.2 s/co ratio (ref 0.0–0.9)
HIV Screen 4th Generation wRfx: NONREACTIVE
Hematocrit: 33.5 % — ABNORMAL LOW (ref 34.0–46.6)
Hemoglobin: 11.3 g/dL (ref 11.1–15.9)
Hepatitis B Surface Ag: NEGATIVE
Immature Grans (Abs): 0 10*3/uL (ref 0.0–0.1)
Immature Granulocytes: 0 %
Lymphocytes Absolute: 2.1 10*3/uL (ref 0.7–3.1)
Lymphs: 26 %
MCH: 30.6 pg (ref 26.6–33.0)
MCHC: 33.7 g/dL (ref 31.5–35.7)
MCV: 91 fL (ref 79–97)
Monocytes Absolute: 0.5 10*3/uL (ref 0.1–0.9)
Monocytes: 6 %
Neutrophils Absolute: 5.3 10*3/uL (ref 1.4–7.0)
Neutrophils: 66 %
Platelets: 250 10*3/uL (ref 150–450)
RBC: 3.69 x10E6/uL — ABNORMAL LOW (ref 3.77–5.28)
RDW: 12.5 % (ref 11.7–15.4)
RPR Ser Ql: NONREACTIVE
Rh Factor: POSITIVE
Rubella Antibodies, IGG: 3.87 index (ref >0.99)
WBC: 8 10*3/uL (ref 3.4–10.8)

## 2020-06-15 LAB — COMPREHENSIVE METABOLIC PANEL
ALT: 8 IU/L (ref 0–32)
AST: 11 IU/L (ref 0–40)
Albumin/Globulin Ratio: 1.4 (ref 1.2–2.2)
Albumin: 3.8 g/dL — ABNORMAL LOW (ref 3.9–5.0)
Alkaline Phosphatase: 57 IU/L (ref 44–121)
BUN/Creatinine Ratio: 14 (ref 9–23)
BUN: 10 mg/dL (ref 6–20)
Bilirubin Total: <0.2 mg/dL (ref 0.0–1.2)
CO2: 18 mmol/L — ABNORMAL LOW (ref 20–29)
Calcium: 9.1 mg/dL (ref 8.7–10.2)
Chloride: 104 mmol/L (ref 96–106)
Creatinine, Ser: 0.7 mg/dL (ref 0.57–1.00)
GFR calc Af Amer: 138 mL/min/{1.73_m2} (ref >59)
GFR calc non Af Amer: 120 mL/min/{1.73_m2} (ref >59)
Globulin, Total: 2.7 g/dL (ref 1.5–4.5)
Glucose: 96 mg/dL (ref 65–99)
Potassium: 4 mmol/L (ref 3.5–5.2)
Sodium: 136 mmol/L (ref 134–144)
Total Protein: 6.5 g/dL (ref 6.0–8.5)

## 2020-06-15 LAB — CERVICOVAGINAL ANCILLARY ONLY
Bacterial Vaginitis (gardnerella): POSITIVE — AB
Candida Glabrata: NEGATIVE
Candida Vaginitis: NEGATIVE
Comment: NEGATIVE
Comment: NEGATIVE
Comment: NEGATIVE

## 2020-06-15 LAB — HCV INTERPRETATION

## 2020-06-15 LAB — HEMOGLOBIN A1C
Est. average glucose Bld gHb Est-mCnc: 108 mg/dL
Hgb A1c MFr Bld: 5.4 % (ref 4.8–5.6)

## 2020-06-15 NOTE — Telephone Encounter (Signed)
Attempt to call pt, as requested; Left HIPPA-compliant message to call back Asher Muir from [employer] for [employer] at [facility] at  [phone] [facility] office).

## 2020-06-16 LAB — URINE CULTURE, OB REFLEX

## 2020-06-16 LAB — CULTURE, OB URINE

## 2020-06-18 ENCOUNTER — Encounter: Payer: Self-pay | Admitting: *Deleted

## 2020-06-21 ENCOUNTER — Other Ambulatory Visit: Payer: Medicaid Other

## 2020-06-21 ENCOUNTER — Ambulatory Visit: Payer: Medicaid Other

## 2020-06-28 ENCOUNTER — Encounter: Payer: Self-pay | Admitting: *Deleted

## 2020-07-11 ENCOUNTER — Ambulatory Visit: Payer: Medicaid Other | Admitting: *Deleted

## 2020-07-11 ENCOUNTER — Encounter: Payer: Self-pay | Admitting: *Deleted

## 2020-07-11 ENCOUNTER — Other Ambulatory Visit: Payer: Self-pay

## 2020-07-11 ENCOUNTER — Other Ambulatory Visit (HOSPITAL_COMMUNITY): Payer: Self-pay | Admitting: Obstetrics & Gynecology

## 2020-07-11 ENCOUNTER — Ambulatory Visit: Payer: Medicaid Other | Attending: Obstetrics & Gynecology

## 2020-07-11 DIAGNOSIS — O09899 Supervision of other high risk pregnancies, unspecified trimester: Secondary | ICD-10-CM | POA: Diagnosis present

## 2020-07-11 DIAGNOSIS — O34219 Maternal care for unspecified type scar from previous cesarean delivery: Secondary | ICD-10-CM | POA: Diagnosis not present

## 2020-07-11 DIAGNOSIS — Z363 Encounter for antenatal screening for malformations: Secondary | ICD-10-CM

## 2020-07-11 DIAGNOSIS — O0990 Supervision of high risk pregnancy, unspecified, unspecified trimester: Secondary | ICD-10-CM | POA: Insufficient documentation

## 2020-07-11 DIAGNOSIS — O468X1 Other antepartum hemorrhage, first trimester: Secondary | ICD-10-CM | POA: Diagnosis present

## 2020-07-11 DIAGNOSIS — O30042 Twin pregnancy, dichorionic/diamniotic, second trimester: Secondary | ICD-10-CM

## 2020-07-11 DIAGNOSIS — O418X10 Other specified disorders of amniotic fluid and membranes, first trimester, not applicable or unspecified: Secondary | ICD-10-CM | POA: Diagnosis not present

## 2020-07-11 DIAGNOSIS — Z3A15 15 weeks gestation of pregnancy: Secondary | ICD-10-CM

## 2020-07-11 DIAGNOSIS — O09212 Supervision of pregnancy with history of pre-term labor, second trimester: Secondary | ICD-10-CM | POA: Diagnosis not present

## 2020-07-12 ENCOUNTER — Ambulatory Visit (INDEPENDENT_AMBULATORY_CARE_PROVIDER_SITE_OTHER): Payer: Medicaid Other | Admitting: Obstetrics and Gynecology

## 2020-07-12 ENCOUNTER — Other Ambulatory Visit: Payer: Self-pay | Admitting: *Deleted

## 2020-07-12 ENCOUNTER — Encounter: Payer: Self-pay | Admitting: Obstetrics and Gynecology

## 2020-07-12 DIAGNOSIS — O0990 Supervision of high risk pregnancy, unspecified, unspecified trimester: Secondary | ICD-10-CM

## 2020-07-12 DIAGNOSIS — Z3A15 15 weeks gestation of pregnancy: Secondary | ICD-10-CM

## 2020-07-12 DIAGNOSIS — O30042 Twin pregnancy, dichorionic/diamniotic, second trimester: Secondary | ICD-10-CM

## 2020-07-12 NOTE — Progress Notes (Signed)
Pt was not seen, stated she could not wait.  Told admitting she had back pain that was affecting her work.  Will send her to physical therapy.

## 2020-07-14 NOTE — L&D Delivery Note (Signed)
OB/GYN Faculty Practice Delivery Note  Jodi Daniel is a 25 y.o. [id number] s/p vaginal delivery at [redacted]w[redacted]d. She was admitted for preterm labor in setting of didi twins.  ROM: 3h 87m with clear fluid GBS Status: unknown; adequate PCN prior to delivery Maximum Maternal Temperature: 98.23F  Labor Progress: On admission, pt noted to be in latent labor. On arrival to L&D epidural was placed. She progressed to active labor and then AROM of Twin A was performed at 2200 for clear fluid. She then progressed to complete cervical dilation at 0059 and then delivered didi twins as noted below. Dr. Despina Hidden and Neonatology present in room at time of delivery.  Twin A Delivery Date/Time: 12/02/20 at 0103 Delivery: Called to room and patient was complete and pushing. Head delivered LOA. No nuchal cord present. Shoulder and body delivered in usual fashion. Infant with spontaneous cry, placed on mother's abdomen, dried and stimulated. Cord clamped x 2 after 1-minute delay, and cut by FOB under my direct supervision. Cord blood drawn. Infant then evaluated at the warmer.  Twin B Delivery Date/Time: 12/02/20 at 0116 Delivery: S/p delivery of Twin A as noted above, bedside ultrasound confirmed cephalic presentation for Twin B. AROM of Twin B for large volume clear fluid. Given persistent fetal terminal bradycardia to 80s, pt was consented for vacuum-assisted vaginal delivery as noted below.  Patient was examined and found to be fully dilated with fetal station of +1. Patient's bladder was noted to be empty, and there were no known fetal contraindications to operative vaginal delivery.  Risks of vacuum assistance were discussed in detail, including but not limited to, bleeding, infection, damage to maternal tissues, fetal cephalohematoma, inability to effect vaginal delivery of the head or shoulder dystocia that cannot be resolved by established maneuvers and need for emergency cesarean section.  Patient gave verbal  consent.  The soft vacuum cup was positioned over the sagittal suture 3 cm anterior to posterior fontanelle.  Pressure was then increased to 500 mmHg, and the patient was instructed to push.  Pulling was administered along the pelvic curve while patient was pushing; there were 1 contraction and 0 popoffs. The infant was then delivered atraumatically. Head delivered direct OA. No nuchal cord present. Shoulder and body delivered in usual fashion. Cord was immediately clamped x 2 and cut by author given initial Apgars. Infant passed to warmer where Neonatal team was waiting. Arterial cord gas and cord blood drawn.  Placenta delivered spontaneously with gentle cord traction. Fundus firm with massage and Pitocin. Labia, perineum, vagina, and cervix were inspected, without evidence of lacerations. Lower uterine sweep performed and TXA was administered.  Placenta: intact, 3-vessel cord x2, home with pt per request Complications: need for vacuum-assisted delivery for Twin B given fetal terminal bradycardia Lacerations: none EBL: 150 ml Analgesia: epidural  Twin A: viable female  APGARs 9 & 9  weight 2495 g Twin B: viable female  APGARs 8 & 9  weight 2750 g  Arterial cord gas pH 7.21  Lynnda Shields, MD OB/GYN Fellow, Faculty Practice

## 2020-07-17 ENCOUNTER — Telehealth: Payer: Self-pay | Admitting: Obstetrics and Gynecology

## 2020-07-17 DIAGNOSIS — O26892 Other specified pregnancy related conditions, second trimester: Secondary | ICD-10-CM

## 2020-07-17 NOTE — Addendum Note (Signed)
Addended by: Kathee Delton on: 07/17/2020 01:38 PM   Modules accepted: Orders

## 2020-07-17 NOTE — Telephone Encounter (Signed)
Called patient to confirm that she desires referral to PT and patient states yes, she has been having a lot of hip/pelvic pain. Patient describes uncomfortable feeling of pelvic grinding and hip joints popping in and out. Told patient I will place referral and PT will call her to set up an appt. Patient verbalized understanding.

## 2020-07-17 NOTE — Telephone Encounter (Signed)
Pt calling and states that she was told that she would need a therapist for the pelvis grinding that she is experiencing. Pt states she can walk but it is painful and popping noise in her bone when standing walking or sitting down, also causes vagina pain. Pt request a call ASAP. thanks

## 2020-07-20 ENCOUNTER — Encounter: Payer: Medicaid Other | Admitting: Family Medicine

## 2020-07-25 ENCOUNTER — Other Ambulatory Visit: Payer: Self-pay

## 2020-07-25 ENCOUNTER — Ambulatory Visit (INDEPENDENT_AMBULATORY_CARE_PROVIDER_SITE_OTHER): Payer: Medicaid Other | Admitting: Obstetrics and Gynecology

## 2020-07-25 VITALS — BP 137/70 | HR 88 | Wt 207.3 lb

## 2020-07-25 DIAGNOSIS — Z3A17 17 weeks gestation of pregnancy: Secondary | ICD-10-CM

## 2020-07-25 DIAGNOSIS — O30042 Twin pregnancy, dichorionic/diamniotic, second trimester: Secondary | ICD-10-CM

## 2020-07-25 DIAGNOSIS — Z98891 History of uterine scar from previous surgery: Secondary | ICD-10-CM

## 2020-07-25 DIAGNOSIS — O09899 Supervision of other high risk pregnancies, unspecified trimester: Secondary | ICD-10-CM

## 2020-07-25 DIAGNOSIS — O0990 Supervision of high risk pregnancy, unspecified, unspecified trimester: Secondary | ICD-10-CM

## 2020-07-25 DIAGNOSIS — Z8759 Personal history of other complications of pregnancy, childbirth and the puerperium: Secondary | ICD-10-CM

## 2020-07-25 MED ORDER — QVAR REDIHALER 80 MCG/ACT IN AERB: 1.0000 | INHALATION_SPRAY | Freq: Two times a day (BID) | RESPIRATORY_TRACT | 3 refills | Status: DC

## 2020-07-25 MED ORDER — ASPIRIN EC 81 MG PO TBEC: 81.0000 mg | DELAYED_RELEASE_TABLET | Freq: Every day | ORAL | 1 refills | Status: DC

## 2020-07-25 MED ORDER — FOLIC ACID 1 MG PO TABS: 1.0000 mg | ORAL_TABLET | Freq: Every day | ORAL | 1 refills | Status: DC

## 2020-07-25 NOTE — Progress Notes (Signed)
Prenatal Visit Note Date: 07/25/2020 Clinic: [facility]  Subjective:  Jodi Daniel is a 25 y.o. [id number] at [redacted]w[redacted]d being seen today for ongoing prenatal care.  She is currently monitored for the following issues for this high-risk pregnancy and has History of recurrent miscarriages; History of cesarean section followed by successful vaginal birth (VBAC); History of placenta abruption; Anxiety and depression; Headache in pregnancy, antepartum; Asthma affecting pregnancy in second trimester; Dichorionic diamniotic twin gestation; Supervision of high risk pregnancy, antepartum; and History of preterm delivery, currently pregnant on their problem list.  Patient reports she continues to low belly discomfort and difficulty with walking at times.    Contractions: Not present. Vag. Bleeding: None.  Movement: Absent. Denies leaking of fluid.   The following portions of the patient's history were reviewed and updated as appropriate: allergies, current medications, past family history, past medical history, past social history, past surgical history and problem list. Problem list updated.  Objective:   Vitals:   07/25/20 1609  BP: 137/70  Pulse: 88  Weight: 207 lb 4.8 oz (94 kg)    Fetal Status: Fetal Heart Rate (bpm): 160/147   Movement: Absent     General:  Alert, oriented and cooperative. Patient is in no acute distress.  Skin: Skin is warm and dry. No rash noted.   Cardiovascular: Normal heart rate noted  Respiratory: Normal respiratory effort, no problems with respiration noted  Abdomen: Soft, gravid, appropriate for gestational age. Pain/Pressure: Absent     Pelvic:  Cervical exam deferred        Extremities: Normal range of motion.  Edema: None  Mental Status: Normal mood and affect. Normal behavior. Normal judgment and thought content.   Urinalysis:      Assessment and Plan:  Pregnancy: [id number] at [redacted]w[redacted]d  1. Supervision of high risk pregnancy, antepartum -  Ambulatory referral to Physical Therapy - AFP, Serum, Open Spina Bifida  2. [redacted] weeks gestation of pregnancy  3. History of cesarean section followed by successful vaginal birth (VBAC) Desires repeat  4. History of placenta abruption  5. Dichorionic diamniotic twin pregnancy in second trimester Followed by mfm. Normal cx length. Has anatomy next week  6. History of preterm delivery, currently pregnant Not on 17p.  Preterm labor symptoms and general obstetric precautions including but not limited to vaginal bleeding, contractions, leaking of fluid and fetal movement were reviewed in detail with the patient. Please refer to After Visit Summary for other counseling recommendations.  RTC 1 month.    [place] Bing, MD

## 2020-07-27 ENCOUNTER — Telehealth: Payer: Self-pay | Admitting: Family Medicine

## 2020-07-27 DIAGNOSIS — M25559 Pain in unspecified hip: Secondary | ICD-10-CM

## 2020-07-27 DIAGNOSIS — O0990 Supervision of high risk pregnancy, unspecified, unspecified trimester: Secondary | ICD-10-CM

## 2020-07-27 LAB — AFP, SERUM, OPEN SPINA BIFIDA
AFP MoM: 1.58
AFP Value: 58 ng/mL
Gest. Age on Collection Date: 17.4 weeks
Maternal Age At EDD: 27.3 yr
OSBR Risk 1 IN: 8808
Test Results:: NEGATIVE
Weight: 207 [lb_av]

## 2020-07-27 NOTE — Telephone Encounter (Signed)
Pt states she was told 07-12-20 that she would get a call for PT.the patient never received a call.  Pt was her 07-25-20 and request for PT in notes..notes: Please schedule for pelvic PT with Eulis Foster. .. Pt would like a call ASAP concerning this PT appt. thanks

## 2020-07-31 NOTE — Telephone Encounter (Signed)
Called pt. Explained there was an issue with referral order that has been resolved. Encouraged pt to contact our office if she has not been contacted by PT at the end of this week.

## 2020-08-02 ENCOUNTER — Other Ambulatory Visit: Payer: Self-pay | Admitting: *Deleted

## 2020-08-02 ENCOUNTER — Encounter: Payer: Self-pay | Admitting: *Deleted

## 2020-08-02 ENCOUNTER — Ambulatory Visit (HOSPITAL_BASED_OUTPATIENT_CLINIC_OR_DEPARTMENT_OTHER): Payer: Medicaid Other | Admitting: Obstetrics and Gynecology

## 2020-08-02 ENCOUNTER — Ambulatory Visit: Payer: Medicaid Other | Admitting: *Deleted

## 2020-08-02 ENCOUNTER — Other Ambulatory Visit: Payer: Self-pay

## 2020-08-02 ENCOUNTER — Ambulatory Visit: Payer: Medicaid Other | Attending: Obstetrics & Gynecology

## 2020-08-02 DIAGNOSIS — O34219 Maternal care for unspecified type scar from previous cesarean delivery: Secondary | ICD-10-CM | POA: Insufficient documentation

## 2020-08-02 DIAGNOSIS — O09899 Supervision of other high risk pregnancies, unspecified trimester: Secondary | ICD-10-CM

## 2020-08-02 DIAGNOSIS — O0990 Supervision of high risk pregnancy, unspecified, unspecified trimester: Secondary | ICD-10-CM

## 2020-08-02 DIAGNOSIS — O09892 Supervision of other high risk pregnancies, second trimester: Secondary | ICD-10-CM | POA: Diagnosis not present

## 2020-08-02 DIAGNOSIS — O30042 Twin pregnancy, dichorionic/diamniotic, second trimester: Secondary | ICD-10-CM | POA: Diagnosis not present

## 2020-08-02 DIAGNOSIS — Z98891 History of uterine scar from previous surgery: Secondary | ICD-10-CM | POA: Diagnosis not present

## 2020-08-02 DIAGNOSIS — O30049 Twin pregnancy, dichorionic/diamniotic, unspecified trimester: Secondary | ICD-10-CM | POA: Diagnosis not present

## 2020-08-02 DIAGNOSIS — O30009 Twin pregnancy, unspecified number of placenta and unspecified number of amniotic sacs, unspecified trimester: Secondary | ICD-10-CM

## 2020-08-02 NOTE — Progress Notes (Signed)
Maternal-Fetal Medicine   Name: Jodi Daniel DOB: [birth year] MRN: [id number] Referring Provider: [place] Bing, MD  I had the [facility] of [facility]  She is G5 P1122 at 18w 5d gestation and is here for ultrasound and consultation. Her high-risk pregnancy problems include: -Dichorionic-diamniotic twin pregnancy. -History of preterm delivery. -Previous cesarean delivery.  Obstetric history: 02/2013L: Preterm delivery at 92 weeks' gestation of a female infant weighing 5 pounds 7 ounces at birth.  Patient reports she delivered at the [facility] and emergency cesarean section was performed because of suspected placental abruption and nonreassuring fetal heart rates.  She also mentioned that throughout her pregnancy cervical shortening was seen and the patient was also on bedrest for a few weeks.  Her daughter is in good health In June 2020, she had a term VBAC of a female infant weighing 7 pounds 10 ounces at birth.  Patient took 42 alpha hydroxyprogesterone prophylaxis in that pregnancy.  No cervical shortening was seen. Patient also had 2 early spontaneous miscarriages  GYN history: No history of cervical surgeries. Past medical history: No history of hypertension or diabetes.  She is stable intermittent asthma Past surgical history: Umbilical hernia repair, facial fracture surgery, cesarean section. Medications: Prenatal vitamins, low-dose aspirin, folic acid, beclomethasone. Allergies: No known drug allergies. Social history: Denies tobacco or drug or alcohol use.  Partner is Tree surgeon and he is in good health.  He is a father of her second child.  Prenatal course: Her pregnancy was dated by 7-week ultrasound.  On cell free fetal DNA screening, the risks of fetal aneuploidies are not increased.  MSAFP screening showed low risk for neural tube defects.  Ultrasound We performed a fetal anatomical survey.   Dichorionic-diamniotic twin pregnancy was confirmed.  Twin A: Lower fetus, cephalic presentation, posterior placenta, female fetus.  Amniotic fluid is normal and good fetal activity seen.  No markers of aneuploidies or fetal structural defects are seen.  Twin B: Upper fetus, transverse lie and head to maternal left, anterior placenta, female fetus. Amniotic fluid is normal and good fetal activity seen.  No markers of aneuploidies or fetal structural defects are seen.  Growth discordancy: 1% (normal). There is no evidence of previa or placenta accreta spectrum (PAS).  After explaining, we performed transvaginal ultrasound to evaluate the cervical length.  The cervix measures 3.6 cm with no funneling or shortening on transfundal pressure.  Small area of echolucency is seen in the midportion of cervical canal which could represent fluid or nabothian cyst (less likely).  Our concerns include History of preterm delivery and current twin pregnancy -Patient took 17-OH progesterone prophylaxis in her previous pregnancy and had a successful term delivery. -Progesterone prophylaxis has not been shown to reduce preterm deliveries in twin pregnancies. -Some recommend 17-OH progesterone prophylaxis in women with twin pregnancies and history of preterm deliveries.  However, the benefit is not clear. -I counseled the patient that there is probably little or no benefit with progesterone prophylaxis.  However, because of a successful outcome in her previous pregnancy and a history of preterm delivery, we will prescribe progesterone if the patient opts for it. -Patient opted not to take progesterone prophylaxis. -I reassured her of normal cervical length measurement on today's ultrasound.  -Cerclage is not usually performed in twin pregnancies if cervical insufficiency is diagnosed. No improved outcomes with cerclage have been reported. However, if cervical length is below 1 cm, rescue cerclage may be  considered.  Twin pregnancy I explained the significance of chorionicity and its implications. I explained the possible complications associated with twin pregnancy that include preterm labor/delivery (most common), fetal growth restriction of one or both twins, miscarriage, malpresentations and increased cesarean delivery rate, postpartum hemorrhage. I also informed her that maternal complications including gestational diabetes and gestational hypertension/preeclampsia are more common. I discussed the mode of delivery that is based on the presentations.  If both have Vx/Vx or Vx/non-vertex presentations, vaginal delivery may be considered. In Vx/non-vx, vaginal delivery followed by internal podalic version of second twin will achieve vaginal delivery. In non-vx first twin presentation, cesarean delivery will be performed.  Twin pregnancy increases the likelihood of gestational hypertension/preeclampsia. Low-dose aspirin delays or prevents development of preeclampsia.  Previous cesarean delivery: Patient informed that she does not want to have VBAC even though the risk of uterine scar rupture is very small. She is planning to have repeat cesarean delivery.  Recommendations: -An appointment was made for her to return in 2 weeks and 4 weeks for cervical length measurement (transvaginal ultrasound). -Fetal growth assessments every 4 weeks. -BPP at 36 and 37 weeks. -Delivery at 38 weeks. -Patient opted not to take intramuscular progesterone injections.  Thank you for consultation. Please contact me if you have any questions.  Consultation including face-to-face counseling 45 minutes.

## 2020-08-06 ENCOUNTER — Inpatient Hospital Stay (HOSPITAL_COMMUNITY)
Admission: AD | Admit: 2020-08-06 | Discharge: 2020-08-06 | Disposition: A | Discharge status: Home / Self Care | Payer: Medicaid Other | Attending: Family Medicine | Admitting: Family Medicine

## 2020-08-06 ENCOUNTER — Other Ambulatory Visit: Payer: Self-pay

## 2020-08-06 ENCOUNTER — Encounter (HOSPITAL_COMMUNITY): Payer: Self-pay | Admitting: Family Medicine

## 2020-08-06 DIAGNOSIS — O26892 Other specified pregnancy related conditions, second trimester: Secondary | ICD-10-CM | POA: Diagnosis not present

## 2020-08-06 DIAGNOSIS — R109 Unspecified abdominal pain: Secondary | ICD-10-CM | POA: Diagnosis not present

## 2020-08-06 DIAGNOSIS — O30042 Twin pregnancy, dichorionic/diamniotic, second trimester: Secondary | ICD-10-CM | POA: Diagnosis not present

## 2020-08-06 DIAGNOSIS — O09212 Supervision of pregnancy with history of pre-term labor, second trimester: Secondary | ICD-10-CM | POA: Insufficient documentation

## 2020-08-06 DIAGNOSIS — Z87891 Personal history of nicotine dependence: Secondary | ICD-10-CM | POA: Insufficient documentation

## 2020-08-06 DIAGNOSIS — Z79899 Other long term (current) drug therapy: Secondary | ICD-10-CM | POA: Insufficient documentation

## 2020-08-06 DIAGNOSIS — Z3A19 19 weeks gestation of pregnancy: Secondary | ICD-10-CM | POA: Diagnosis not present

## 2020-08-06 DIAGNOSIS — O09899 Supervision of other high risk pregnancies, unspecified trimester: Secondary | ICD-10-CM

## 2020-08-06 DIAGNOSIS — Z7982 Long term (current) use of aspirin: Secondary | ICD-10-CM | POA: Diagnosis not present

## 2020-08-06 DIAGNOSIS — O0990 Supervision of high risk pregnancy, unspecified, unspecified trimester: Secondary | ICD-10-CM

## 2020-08-06 LAB — URINALYSIS, ROUTINE W REFLEX MICROSCOPIC
Bilirubin Urine: NEGATIVE
Glucose, UA: NEGATIVE mg/dL
Hgb urine dipstick: NEGATIVE
Ketones, ur: NEGATIVE mg/dL
Leukocytes,Ua: NEGATIVE
Nitrite: NEGATIVE
Protein, ur: NEGATIVE mg/dL
Specific Gravity, Urine: 1.019 (ref 1.005–1.030)
pH: 7 (ref 5.0–8.0)

## 2020-08-06 LAB — WET PREP, GENITAL
Clue Cells Wet Prep HPF POC: NONE SEEN
Sperm: NONE SEEN
Trich, Wet Prep: NONE SEEN
Yeast Wet Prep HPF POC: NONE SEEN
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
## 2020-08-06 NOTE — MAU Note (Signed)
Pt reports abd tightening since yesterday,denies bleeding.

## 2020-08-07 LAB — GC/CHLAMYDIA PROBE AMP (~~LOC~~) NOT AT ARMC
Chlamydia: NEGATIVE
Comment: NEGATIVE
Comment: NORMAL
Neisseria Gonorrhea: NEGATIVE

## 2020-08-09 DIAGNOSIS — J45909 Unspecified asthma, uncomplicated: Secondary | ICD-10-CM

## 2020-08-09 MED ORDER — FLOVENT HFA 110 MCG/ACT IN AERO: 1.0000 | INHALATION_SPRAY | Freq: Two times a day (BID) | RESPIRATORY_TRACT | 1 refills | Status: DC | PRN

## 2020-08-16 ENCOUNTER — Ambulatory Visit: Payer: Medicaid Other | Attending: Obstetrics and Gynecology

## 2020-08-16 ENCOUNTER — Ambulatory Visit: Payer: Medicaid Other | Admitting: *Deleted

## 2020-08-16 ENCOUNTER — Other Ambulatory Visit: Payer: Self-pay

## 2020-08-16 ENCOUNTER — Encounter: Payer: Self-pay | Admitting: *Deleted

## 2020-08-16 DIAGNOSIS — O322XX2 Maternal care for transverse and oblique lie, fetus 2: Secondary | ICD-10-CM

## 2020-08-16 DIAGNOSIS — O30042 Twin pregnancy, dichorionic/diamniotic, second trimester: Secondary | ICD-10-CM | POA: Diagnosis not present

## 2020-08-16 DIAGNOSIS — O30009 Twin pregnancy, unspecified number of placenta and unspecified number of amniotic sacs, unspecified trimester: Secondary | ICD-10-CM | POA: Insufficient documentation

## 2020-08-16 DIAGNOSIS — Z3A20 20 weeks gestation of pregnancy: Secondary | ICD-10-CM

## 2020-08-16 DIAGNOSIS — O09899 Supervision of other high risk pregnancies, unspecified trimester: Secondary | ICD-10-CM | POA: Diagnosis present

## 2020-08-16 DIAGNOSIS — Z363 Encounter for antenatal screening for malformations: Secondary | ICD-10-CM | POA: Diagnosis not present

## 2020-08-16 DIAGNOSIS — O09212 Supervision of pregnancy with history of pre-term labor, second trimester: Secondary | ICD-10-CM | POA: Diagnosis not present

## 2020-08-16 DIAGNOSIS — O0990 Supervision of high risk pregnancy, unspecified, unspecified trimester: Secondary | ICD-10-CM | POA: Insufficient documentation

## 2020-08-16 DIAGNOSIS — O34219 Maternal care for unspecified type scar from previous cesarean delivery: Secondary | ICD-10-CM

## 2020-08-20 ENCOUNTER — Other Ambulatory Visit: Payer: Self-pay

## 2020-08-20 MED ORDER — OMEPRAZOLE 20 MG PO CPDR
20.0000 mg | DELAYED_RELEASE_CAPSULE | Freq: Every day | ORAL | 0 refills | Status: DC
Start: 2020-08-20 — End: 2020-09-19

## 2020-08-23 ENCOUNTER — Ambulatory Visit: Payer: Medicaid Other

## 2020-08-27 ENCOUNTER — Encounter: Payer: Medicaid Other | Admitting: Obstetrics and Gynecology

## 2020-08-28 ENCOUNTER — Ambulatory Visit: Payer: Medicaid Other

## 2020-08-30 ENCOUNTER — Other Ambulatory Visit: Payer: Self-pay

## 2020-08-30 ENCOUNTER — Ambulatory Visit: Payer: Medicaid Other | Admitting: *Deleted

## 2020-08-30 ENCOUNTER — Other Ambulatory Visit: Payer: Self-pay | Admitting: *Deleted

## 2020-08-30 ENCOUNTER — Ambulatory Visit: Payer: Medicaid Other | Attending: Obstetrics and Gynecology

## 2020-08-30 ENCOUNTER — Encounter: Payer: Self-pay | Admitting: *Deleted

## 2020-08-30 DIAGNOSIS — O30042 Twin pregnancy, dichorionic/diamniotic, second trimester: Secondary | ICD-10-CM

## 2020-08-30 DIAGNOSIS — O0990 Supervision of high risk pregnancy, unspecified, unspecified trimester: Secondary | ICD-10-CM

## 2020-08-30 DIAGNOSIS — O30009 Twin pregnancy, unspecified number of placenta and unspecified number of amniotic sacs, unspecified trimester: Secondary | ICD-10-CM | POA: Insufficient documentation

## 2020-08-30 DIAGNOSIS — O30049 Twin pregnancy, dichorionic/diamniotic, unspecified trimester: Secondary | ICD-10-CM

## 2020-08-30 DIAGNOSIS — O09212 Supervision of pregnancy with history of pre-term labor, second trimester: Secondary | ICD-10-CM

## 2020-08-30 DIAGNOSIS — O34219 Maternal care for unspecified type scar from previous cesarean delivery: Secondary | ICD-10-CM

## 2020-08-30 DIAGNOSIS — O09899 Supervision of other high risk pregnancies, unspecified trimester: Secondary | ICD-10-CM | POA: Insufficient documentation

## 2020-08-30 DIAGNOSIS — Z3A22 22 weeks gestation of pregnancy: Secondary | ICD-10-CM

## 2020-09-13 ENCOUNTER — Other Ambulatory Visit: Payer: Self-pay

## 2020-09-13 ENCOUNTER — Ambulatory Visit: Payer: Medicaid Other | Attending: Obstetrics and Gynecology

## 2020-09-13 DIAGNOSIS — M62838 Other muscle spasm: Secondary | ICD-10-CM | POA: Insufficient documentation

## 2020-09-13 DIAGNOSIS — M533 Sacrococcygeal disorders, not elsewhere classified: Secondary | ICD-10-CM | POA: Diagnosis not present

## 2020-09-13 DIAGNOSIS — M6281 Muscle weakness (generalized): Secondary | ICD-10-CM | POA: Diagnosis present

## 2020-09-13 DIAGNOSIS — R293 Abnormal posture: Secondary | ICD-10-CM | POA: Insufficient documentation

## 2020-09-13 NOTE — Patient Instructions (Signed)
Stabilization: Diaphragmatic Breathing    Lie with knees bent, feet flat. Place one hand on stomach, other on chest. Breathe deeply through nose, lifting belly hand without any motion of hand on chest.  Do this for at least 5 min. Each night in a reclined position.
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
## 2020-09-19 ENCOUNTER — Ambulatory Visit (INDEPENDENT_AMBULATORY_CARE_PROVIDER_SITE_OTHER): Payer: Medicaid Other | Admitting: Obstetrics and Gynecology

## 2020-09-19 ENCOUNTER — Other Ambulatory Visit: Payer: Self-pay

## 2020-09-19 ENCOUNTER — Encounter: Payer: Self-pay | Admitting: Obstetrics and Gynecology

## 2020-09-19 VITALS — BP 121/77 | HR 88 | Wt 233.9 lb

## 2020-09-19 DIAGNOSIS — O09899 Supervision of other high risk pregnancies, unspecified trimester: Secondary | ICD-10-CM

## 2020-09-19 DIAGNOSIS — O99210 Obesity complicating pregnancy, unspecified trimester: Secondary | ICD-10-CM

## 2020-09-19 DIAGNOSIS — Z3A25 25 weeks gestation of pregnancy: Secondary | ICD-10-CM

## 2020-09-19 DIAGNOSIS — J45909 Unspecified asthma, uncomplicated: Secondary | ICD-10-CM

## 2020-09-19 DIAGNOSIS — E669 Obesity, unspecified: Secondary | ICD-10-CM

## 2020-09-19 DIAGNOSIS — Z98891 History of uterine scar from previous surgery: Secondary | ICD-10-CM

## 2020-09-19 DIAGNOSIS — O0990 Supervision of high risk pregnancy, unspecified, unspecified trimester: Secondary | ICD-10-CM

## 2020-09-19 DIAGNOSIS — O99512 Diseases of the respiratory system complicating pregnancy, second trimester: Secondary | ICD-10-CM

## 2020-09-19 DIAGNOSIS — O30042 Twin pregnancy, dichorionic/diamniotic, second trimester: Secondary | ICD-10-CM

## 2020-09-19 MED ORDER — FLUTICASONE-SALMETEROL 230-21 MCG/ACT IN AERO: 2.0000 | INHALATION_SPRAY | Freq: Two times a day (BID) | RESPIRATORY_TRACT | 12 refills | Status: AC

## 2020-09-19 MED ORDER — MONTELUKAST SODIUM 10 MG PO TABS: 10.0000 mg | ORAL_TABLET | Freq: Every day | ORAL | 1 refills | Status: DC

## 2020-09-19 MED ORDER — PANTOPRAZOLE SODIUM 20 MG PO TBEC: 20.0000 mg | DELAYED_RELEASE_TABLET | Freq: Two times a day (BID) | ORAL | 2 refills | Status: DC

## 2020-09-19 MED ORDER — CETIRIZINE HCL 10 MG PO TABS: 10.0000 mg | ORAL_TABLET | Freq: Every day | ORAL | 1 refills | Status: DC

## 2020-09-19 NOTE — Patient Instructions (Signed)
I strongly recommend you get the covid shot and boosters because of your asthma and pregnancy

## 2020-09-20 ENCOUNTER — Ambulatory Visit: Payer: Medicaid Other

## 2020-09-20 DIAGNOSIS — O99210 Obesity complicating pregnancy, unspecified trimester: Secondary | ICD-10-CM | POA: Insufficient documentation

## 2020-09-20 DIAGNOSIS — E669 Obesity, unspecified: Secondary | ICD-10-CM | POA: Insufficient documentation

## 2020-09-20 NOTE — Progress Notes (Signed)
Prenatal Visit Note Date: 09/19/2020 Clinic: [facility]  Subjective:  Jodi Daniel is a 25 y.o. [id number] at [redacted]w[redacted]d being seen today for ongoing prenatal care.  She is currently monitored for the following issues for this high-risk pregnancy and has History of cesarean section followed by successful vaginal birth (VBAC); History of placenta abruption; Anxiety and depression; Headache in pregnancy, antepartum; Asthma affecting pregnancy in second trimester; Dichorionic diamniotic twin gestation; Supervision of high risk pregnancy, antepartum; History of preterm delivery, currently pregnant; BMI 30s; and Obesity in pregnancy on their problem list.  Patient reports pregnancy aches and pains; asthma not controlled (see below)   Contractions: Not present. Vag. Bleeding: None.  Movement: Present. Denies leaking of fluid.   The following portions of the patient's history were reviewed and updated as appropriate: allergies, current medications, past family history, past medical history, past social history, past surgical history and problem list. Problem list updated.  Objective:   Vitals:   09/19/20 1513  BP: 121/77  Pulse: 88  Weight: 233 lb 14.4 oz (106.1 kg)    Fetal Status: Fetal Heart Rate (bpm): 132,140   Movement: Present     General:  Alert, oriented and cooperative. Patient is in no acute distress.  Skin: Skin is warm and dry. No rash noted.   Cardiovascular: Normal heart rate noted  Respiratory: Normal respiratory effort, no problems with respiration noted  Abdomen: Soft, gravid, appropriate for gestational age. Pain/Pressure: Present     Pelvic:  Cervical exam deferred        Extremities: Normal range of motion.  Edema: None  Mental Status: Normal mood and affect. Normal behavior. Normal judgment and thought content.   Urinalysis:      Assessment and Plan:  Pregnancy: [id number] at [redacted]w[redacted]d  1. Supervision of high risk pregnancy, antepartum -Routine  care -Desires BTL. She states she signed them but I don't see them in media. Sign BTL papers nv -28wk labs nv  2. Dichorionic diamniotic twin pregnancy in second trimester Patient desires rpt c/s. Routine care 2/17: 32%, 509gm, nl MVP and 56%, 551gm, nl MVP  3. Asthma affecting pregnancy in second trimester Patient on fluticasone 110 2 puffs bid and having daily s/s. covid shots advised.  Recommended switching to advair 230/21 and Rx for patient to start protonix and singulair. Work note written until nv   4. History of cesarean section followed by successful vaginal birth (VBAC) See above.  5. History of preterm delivery, currently pregnant Not on 17p  6. BMI 30s  7. Obesity in pregnancy  Preterm labor symptoms and general obstetric precautions including but not limited to vaginal bleeding, contractions, leaking of fluid and fetal movement were reviewed in detail with the patient. Please refer to After Visit Summary for other counseling recommendations.  Return in about 8 days (around 09/27/2020) for in person, 2hr GTT, md or app.   Morris Bing, MD

## 2020-09-21 ENCOUNTER — Other Ambulatory Visit: Payer: Self-pay

## 2020-09-21 ENCOUNTER — Encounter (HOSPITAL_COMMUNITY): Payer: Self-pay

## 2020-09-21 ENCOUNTER — Inpatient Hospital Stay (HOSPITAL_COMMUNITY)
Admission: AD | Admit: 2020-09-21 | Discharge: 2020-09-21 | Disposition: A | Discharge status: Home / Self Care | Payer: Medicaid Other | Attending: Obstetrics & Gynecology | Admitting: Obstetrics & Gynecology

## 2020-09-21 DIAGNOSIS — O4702 False labor before 37 completed weeks of gestation, second trimester: Secondary | ICD-10-CM | POA: Diagnosis not present

## 2020-09-21 DIAGNOSIS — R109 Unspecified abdominal pain: Secondary | ICD-10-CM | POA: Diagnosis not present

## 2020-09-21 DIAGNOSIS — Z87891 Personal history of nicotine dependence: Secondary | ICD-10-CM | POA: Insufficient documentation

## 2020-09-21 DIAGNOSIS — Z3A25 25 weeks gestation of pregnancy: Secondary | ICD-10-CM | POA: Diagnosis not present

## 2020-09-21 DIAGNOSIS — Z79899 Other long term (current) drug therapy: Secondary | ICD-10-CM | POA: Insufficient documentation

## 2020-09-21 DIAGNOSIS — O26892 Other specified pregnancy related conditions, second trimester: Secondary | ICD-10-CM | POA: Insufficient documentation

## 2020-09-21 DIAGNOSIS — Z7982 Long term (current) use of aspirin: Secondary | ICD-10-CM | POA: Diagnosis not present

## 2020-09-21 DIAGNOSIS — O30043 Twin pregnancy, dichorionic/diamniotic, third trimester: Secondary | ICD-10-CM

## 2020-09-21 DIAGNOSIS — Z3689 Encounter for other specified antenatal screening: Secondary | ICD-10-CM

## 2020-09-21 DIAGNOSIS — Z7951 Long term (current) use of inhaled steroids: Secondary | ICD-10-CM | POA: Diagnosis not present

## 2020-09-21 LAB — URINALYSIS, ROUTINE W REFLEX MICROSCOPIC
Bilirubin Urine: NEGATIVE
Glucose, UA: NEGATIVE mg/dL
Hgb urine dipstick: NEGATIVE
Ketones, ur: NEGATIVE mg/dL
Leukocytes,Ua: NEGATIVE
Nitrite: NEGATIVE
Protein, ur: NEGATIVE mg/dL
Specific Gravity, Urine: 1.01 (ref 1.005–1.030)
pH: 7 (ref 5.0–8.0)

## 2020-09-21 LAB — FETAL FIBRONECTIN: Fetal Fibronectin: NEGATIVE

## 2020-09-21 MED ORDER — ACETAMINOPHEN 500 MG PO TABS
1000.0000 mg | ORAL_TABLET | Freq: Once | ORAL | Status: AC
  Administered 2020-09-21: 1000 mg via ORAL
  Filled 2020-09-21: qty 2

## 2020-09-21 MED ORDER — NIFEDIPINE 10 MG PO CAPS
10.0000 mg | ORAL_CAPSULE | ORAL | Status: DC | PRN
  Administered 2020-09-21: 10 mg via ORAL
  Filled 2020-09-21 (×2): qty 1
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
## 2020-09-21 NOTE — MAU Note (Signed)
..  Jodi Daniel is a 25 y.o. at [redacted]w[redacted]d here in MAU reporting: contractions all day and has not been able to time them. +FM. Denies vaginal bleeding or leaking of fluid.   Pain score: 5/10  Lab orders placed from triage: UA

## 2020-09-23 ENCOUNTER — Other Ambulatory Visit: Payer: Self-pay

## 2020-09-23 ENCOUNTER — Encounter (HOSPITAL_COMMUNITY): Payer: Self-pay | Admitting: Obstetrics & Gynecology

## 2020-09-23 ENCOUNTER — Inpatient Hospital Stay (HOSPITAL_COMMUNITY)
Admission: AD | Admit: 2020-09-23 | Discharge: 2020-09-24 | Disposition: A | Discharge status: Home / Self Care | Payer: Medicaid Other | Attending: Obstetrics & Gynecology | Admitting: Obstetrics & Gynecology

## 2020-09-23 DIAGNOSIS — O99512 Diseases of the respiratory system complicating pregnancy, second trimester: Secondary | ICD-10-CM | POA: Diagnosis not present

## 2020-09-23 DIAGNOSIS — Z3A26 26 weeks gestation of pregnancy: Secondary | ICD-10-CM

## 2020-09-23 DIAGNOSIS — Z7951 Long term (current) use of inhaled steroids: Secondary | ICD-10-CM | POA: Insufficient documentation

## 2020-09-23 DIAGNOSIS — O99282 Endocrine, nutritional and metabolic diseases complicating pregnancy, second trimester: Secondary | ICD-10-CM | POA: Diagnosis not present

## 2020-09-23 DIAGNOSIS — O99280 Endocrine, nutritional and metabolic diseases complicating pregnancy, unspecified trimester: Secondary | ICD-10-CM

## 2020-09-23 DIAGNOSIS — O4700 False labor before 37 completed weeks of gestation, unspecified trimester: Secondary | ICD-10-CM | POA: Diagnosis present

## 2020-09-23 DIAGNOSIS — Z7982 Long term (current) use of aspirin: Secondary | ICD-10-CM | POA: Insufficient documentation

## 2020-09-23 DIAGNOSIS — E860 Dehydration: Secondary | ICD-10-CM

## 2020-09-23 DIAGNOSIS — O211 Hyperemesis gravidarum with metabolic disturbance: Secondary | ICD-10-CM | POA: Diagnosis not present

## 2020-09-23 DIAGNOSIS — Z87891 Personal history of nicotine dependence: Secondary | ICD-10-CM | POA: Diagnosis not present

## 2020-09-23 DIAGNOSIS — O4702 False labor before 37 completed weeks of gestation, second trimester: Secondary | ICD-10-CM

## 2020-09-23 DIAGNOSIS — O30042 Twin pregnancy, dichorionic/diamniotic, second trimester: Secondary | ICD-10-CM | POA: Diagnosis not present

## 2020-09-23 DIAGNOSIS — O212 Late vomiting of pregnancy: Secondary | ICD-10-CM | POA: Diagnosis not present

## 2020-09-23 DIAGNOSIS — J45909 Unspecified asthma, uncomplicated: Secondary | ICD-10-CM | POA: Diagnosis not present

## 2020-09-23 DIAGNOSIS — O219 Vomiting of pregnancy, unspecified: Secondary | ICD-10-CM

## 2020-09-23 DIAGNOSIS — O30049 Twin pregnancy, dichorionic/diamniotic, unspecified trimester: Secondary | ICD-10-CM

## 2020-09-23 MED ORDER — ONDANSETRON HCL 4 MG/2ML IJ SOLN
4.0000 mg | Freq: Once | INTRAMUSCULAR | Status: AC
  Administered 2020-09-23: 4 mg via INTRAVENOUS
  Filled 2020-09-23: qty 2

## 2020-09-23 MED ORDER — FAMOTIDINE IN NACL 20-0.9 MG/50ML-% IV SOLN
20.0000 mg | Freq: Once | INTRAVENOUS | Status: AC
  Administered 2020-09-23: 20 mg via INTRAVENOUS
  Filled 2020-09-23: qty 50

## 2020-09-23 MED ORDER — NIFEDIPINE 10 MG PO CAPS
10.0000 mg | ORAL_CAPSULE | ORAL | Status: AC | PRN
  Administered 2020-09-23 – 2020-09-24 (×3): 10 mg via ORAL
  Filled 2020-09-23 (×3): qty 1

## 2020-09-23 MED ORDER — LACTATED RINGERS IV BOLUS
1000.0000 mL | Freq: Once | INTRAVENOUS | Status: AC
  Administered 2020-09-23: 1000 mL via INTRAVENOUS
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
## 2020-09-23 NOTE — Progress Notes (Signed)
Pt reports Ctx started a few days ago (visit here 3/11), intermittent 7/10, getting worse since then. Reports burning and tightening in lower pelvis. N/V started this morning. Feels stomach is very tight when she stands. Denies VB, LOF, and reports +FM for both babies.   Libertie Hausler M Gerado Nabers RNC-OB

## 2020-09-24 ENCOUNTER — Encounter: Payer: Self-pay | Admitting: *Deleted

## 2020-09-24 ENCOUNTER — Ambulatory Visit: Payer: Medicaid Other

## 2020-09-24 DIAGNOSIS — M6281 Muscle weakness (generalized): Secondary | ICD-10-CM

## 2020-09-24 DIAGNOSIS — M533 Sacrococcygeal disorders, not elsewhere classified: Secondary | ICD-10-CM

## 2020-09-24 DIAGNOSIS — R293 Abnormal posture: Secondary | ICD-10-CM

## 2020-09-24 DIAGNOSIS — M62838 Other muscle spasm: Secondary | ICD-10-CM

## 2020-09-24 MED ORDER — ONDANSETRON 4 MG PO TBDP: 4.0000 mg | ORAL_TABLET | Freq: Three times a day (TID) | ORAL | 0 refills | Status: DC | PRN

## 2020-09-24 MED ORDER — FAMOTIDINE 20 MG PO TABS: 20.0000 mg | ORAL_TABLET | Freq: Two times a day (BID) | ORAL | 1 refills | Status: DC

## 2020-09-24 MED ORDER — PROMETHAZINE HCL 25 MG PO TABS: 25.0000 mg | ORAL_TABLET | Freq: Four times a day (QID) | ORAL | 0 refills | Status: DC | PRN

## 2020-09-24 NOTE — Patient Instructions (Signed)
   Hold for 30 seconds (5 deep breaths) and repeat 2-3 times on each side once a day    Hold for 30 seconds (5 deep breaths) and repeat 2-3 times on each side once a day    Do 2 sets of 15 tilts per day. Breathe in when you tilt forward (A) and out when you tuck under (B).
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
## 2020-09-24 NOTE — Discharge Instructions (Signed)

## 2020-09-25 ENCOUNTER — Other Ambulatory Visit: Payer: Self-pay | Admitting: Lactation Services

## 2020-09-25 DIAGNOSIS — O30049 Twin pregnancy, dichorionic/diamniotic, unspecified trimester: Secondary | ICD-10-CM

## 2020-09-27 ENCOUNTER — Ambulatory Visit: Payer: Medicaid Other | Admitting: *Deleted

## 2020-09-27 ENCOUNTER — Ambulatory Visit: Payer: Medicaid Other | Attending: Obstetrics

## 2020-09-27 ENCOUNTER — Ambulatory Visit (INDEPENDENT_AMBULATORY_CARE_PROVIDER_SITE_OTHER): Payer: Medicaid Other | Admitting: Obstetrics and Gynecology

## 2020-09-27 ENCOUNTER — Encounter: Payer: Self-pay | Admitting: *Deleted

## 2020-09-27 ENCOUNTER — Other Ambulatory Visit: Payer: Self-pay | Admitting: *Deleted

## 2020-09-27 ENCOUNTER — Other Ambulatory Visit: Payer: Self-pay

## 2020-09-27 ENCOUNTER — Ambulatory Visit: Payer: Medicaid Other

## 2020-09-27 ENCOUNTER — Other Ambulatory Visit: Payer: Medicaid Other

## 2020-09-27 VITALS — BP 107/60 | HR 91 | Wt 236.0 lb

## 2020-09-27 DIAGNOSIS — O99512 Diseases of the respiratory system complicating pregnancy, second trimester: Secondary | ICD-10-CM

## 2020-09-27 DIAGNOSIS — O0990 Supervision of high risk pregnancy, unspecified, unspecified trimester: Secondary | ICD-10-CM

## 2020-09-27 DIAGNOSIS — Z3A26 26 weeks gestation of pregnancy: Secondary | ICD-10-CM

## 2020-09-27 DIAGNOSIS — Z23 Encounter for immunization: Secondary | ICD-10-CM | POA: Diagnosis not present

## 2020-09-27 DIAGNOSIS — O30049 Twin pregnancy, dichorionic/diamniotic, unspecified trimester: Secondary | ICD-10-CM | POA: Insufficient documentation

## 2020-09-27 DIAGNOSIS — O34219 Maternal care for unspecified type scar from previous cesarean delivery: Secondary | ICD-10-CM

## 2020-09-27 DIAGNOSIS — Z8759 Personal history of other complications of pregnancy, childbirth and the puerperium: Secondary | ICD-10-CM

## 2020-09-27 DIAGNOSIS — O09899 Supervision of other high risk pregnancies, unspecified trimester: Secondary | ICD-10-CM | POA: Diagnosis present

## 2020-09-27 DIAGNOSIS — O30042 Twin pregnancy, dichorionic/diamniotic, second trimester: Secondary | ICD-10-CM | POA: Diagnosis not present

## 2020-09-27 DIAGNOSIS — J45909 Unspecified asthma, uncomplicated: Secondary | ICD-10-CM | POA: Diagnosis not present

## 2020-09-27 DIAGNOSIS — Z98891 History of uterine scar from previous surgery: Secondary | ICD-10-CM

## 2020-09-27 DIAGNOSIS — O09212 Supervision of pregnancy with history of pre-term labor, second trimester: Secondary | ICD-10-CM | POA: Diagnosis not present

## 2020-09-27 NOTE — Progress Notes (Signed)
   PRENATAL VISIT NOTE  Subjective:  Jodi Daniel is a 25 y.o. [id number] at [redacted]w[redacted]d being seen today for ongoing prenatal care.  She is currently monitored for the following issues for this high-risk pregnancy and has History of cesarean section followed by successful vaginal birth (VBAC); History of placenta abruption; Anxiety and depression; Headache in pregnancy, antepartum; Asthma affecting pregnancy in second trimester; Dichorionic diamniotic twin gestation; Supervision of high risk pregnancy, antepartum; History of preterm delivery, currently pregnant; BMI 30s; Obesity in pregnancy; and [redacted] weeks gestation of pregnancy on their problem list.  Patient doing well with no acute concerns today. She reports occasional contractions and shortness of breath.  Contractions: Irregular. Vag. Bleeding: None.  Movement: Present. Denies leaking of fluid.   Pt complains about chronic shortness of breath and states it affects her work.  She would like to be out of work. She is currently taking Advair and singulair for treatment of asthma.  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  Pt advised to seek care with her PCP/internal medicine for validation of worsening asthma before she can be pulled from work or subjectively worsening symptoms.  Most symptoms likely from deconditioning and large gravid uterus.  The following portions of the patient's history were reviewed and updated as appropriate: allergies, current medications, past family history, past medical history, past social history, past surgical history and problem list. Problem list updated.  Objective:   Vitals:   09/27/20 0825  BP: 107/60  Pulse: 91  Weight: 236 lb (107 kg)    Fetal Status: Fetal Heart Rate (bpm): 143/142   Movement: Present     General:  Alert, oriented and cooperative. Patient is in no acute distress.  Skin: Skin is warm and dry. No rash noted.   Cardiovascular: Normal heart rate noted  Respiratory:  Normal respiratory effort, no problems with respiration noted, no wheezes crackles or rhonchi noted, good air movement  Abdomen: Soft, gravid, appropriate for gestational age.  Pain/Pressure: Present     Pelvic: Cervical exam deferred        Extremities: Normal range of motion.  Edema: None  Mental Status:  Normal mood and affect. Normal behavior. Normal judgment and thought content.   Assessment and Plan:  Pregnancy: [id number] at [redacted]w[redacted]d  1. Asthma affecting pregnancy in second trimester Continue meds, pt may need rescue inhaler, advised to follow up with PCP  2. Dichorionic diamniotic twin pregnancy, antepartum Pt has growth /BPP today - Tdap vaccine greater than or equal to 7yo IM  3. Supervision of high risk pregnancy, antepartum  - Tdap vaccine greater than or equal to 7yo IM  4. History of preterm delivery, currently pregnant Recent FFN neg, uterine irritability noted  5. History of placenta abruption   6. History of cesarean section followed by successful vaginal birth (VBAC) Pt desires repeat c/s and BTL, repeat c/s form signed  7. [redacted] weeks gestation of pregnancy   Preterm labor symptoms and general obstetric precautions including but not limited to vaginal bleeding, contractions, leaking of fluid and fetal movement were reviewed in detail with the patient.  Please refer to After Visit Summary for other counseling recommendations.   Return in about 2 weeks (around 10/11/2020) for [facility], in person.   Mariel Aloe, MD Faculty Attending Center for [facility]

## 2020-09-27 NOTE — Patient Instructions (Signed)
National Guideline Alliance ([place]).Twin and Triplet Pregnancy. [place]: National Institute for Health and Care Excellence (UK); 2019.">  Multiple Pregnancy Multiple pregnancy means that a woman is carrying more than one baby at a time. She may be pregnant with twins, triplets, or more. The majority of multiple pregnancies are twins. Naturally conceiving triplets or more (higher-order multiples) is rare. Multiple pregnancies are riskier than single pregnancies. A woman with a multiple pregnancy is more likely to have certain problems during her pregnancy. How does a multiple pregnancy happen? A multiple pregnancy happens when:  The woman's body releases more than one egg at a time, and then each egg gets fertilized by a different sperm. ? This is the most common type of multiple pregnancy. ? Twins or other multiples produced this way are called fraternal. They are no more alike than non-multiple siblings are.  One sperm fertilizes one egg, which then divides into more than one embryo. ? Twins or other multiples produced this way are called identical. Identical multiples are always the same gender, and they look very much alike.   Who is most likely to have a multiple pregnancy? A multiple pregnancy is more likely to develop in women who:  Have had fertility treatment, especially if the treatment included fertility medicines.  Are older than 27 years of age.  Have already had four or more children.  Have a family history of multiple pregnancy. How is a multiple pregnancy diagnosed? A multiple pregnancy may be diagnosed based on:  Symptoms such as: ? Rapid weight gain in the first 3 months of pregnancy (first trimester). ? More severe nausea and breast tenderness than what is typical of a single pregnancy. ? A larger uterus than what is normal for the stage of the pregnancy.  Blood tests that detect a higher-than-normal level of human chorionic gonadotropin (hCG). This is a hormone that your  body produces in early pregnancy.  An ultrasound exam. This is used to confirm that you are carrying multiples. What risks come with multiple pregnancy? A multiple pregnancy puts you at a higher risk for certain problems during or after your pregnancy. These include:  Delivering your babies before your due date (preterm birth). A full-term pregnancy lasts for at least 37 weeks. ? Babies born before 37 weeks may have a higher risk for breathing problems, feeding difficulties, cerebral palsy, and learning disabilities.  Diabetes.  Preeclampsia. This is a serious condition that causes high blood pressure and headaches during pregnancy.  Too much blood loss after childbirth (postpartum hemorrhage).  Postpartum depression.  Low birth weight of the babies. How will having a multiple pregnancy affect my care? Your health care team will monitor you more closely. You may need more frequent prenatal visits. This will ensure that you are healthy and that your babies are growing normally. Follow these instructions at home: Eating and drinking  Increase your nutrition. ? Follow your health care provider's recommendations for weight gain. You may need to gain a little extra weight when you are pregnant with multiples. ? Eat healthy snacks often throughout the day. This will add calories and reduce nausea.  Drink enough fluid to keep your urine pale yellow.  Take prenatal vitamins. Ask your health care provider what vitamins are right for you. Activity Limit your activities by 20-24 weeks of pregnancy.  Rest often.  Avoid activities, exercise, and work that take a lot of effort.  Ask your health care provider when you should stop having sex. General instructions  Do not   use any products that contain nicotine or tobacco, such as cigarettes, e-cigarettes, and chewing tobacco. If you need help quitting, ask your health care provider.  Do not drink alcohol or use illegal drugs.  Take  over-the-counter and prescription medicines only as told by your health care provider.  Arrange for extra help around the house.  Keep all follow-up visits and all prenatal visits as told by your health care provider. This is important. Where to find more information  American College of Obstetricians and Gynecology: www.acog.org Contact a health care provider if:  You have dizziness.  You have nausea, vomiting, or diarrhea that does not go away.  You have depression or other emotions that are interfering with your normal activities.  You have a fever.  You have pain with urination.  You have a bad-smelling vaginal discharge.  You notice increased swelling in your face, hands, legs, or ankles. Get help right away if:  You have fluid leaking from your vagina.  You have bleeding from your vagina.  You have pelvic cramps, pelvic pressure, or nagging pain in your abdomen or lower back.  You are having regular contractions.  You have a severe headache, with or without changes in how you see.  You have chest pain or shortness of breath.  You notice that your babies move less often, or do not move at all. Summary  Having a multiple pregnancy means that a woman is carrying more than one baby at a time.  A multiple pregnancy puts you at a higher risk for delivering your babies before your due date, having diabetes, preeclampsia, too much blood loss after childbirth, or low birth weight of the babies.  Your health care provider will monitor you more closely during your pregnancy.  You may need to make some lifestyle changes during pregnancy. This includes eating more, limiting your activities after 20-24 weeks of pregnancy, and arranging for extra help around the house.  Follow up with your health care provider as instructed if you experience any complications. This information is not intended to replace advice given to you by your health care provider. Make sure you discuss  any questions you have with your health care provider. Document Revised: 02/21/2019 Document Reviewed: 02/21/2019 Elsevier Patient Education  2021 Elsevier Inc.  

## 2020-09-27 NOTE — Progress Notes (Signed)
C/o of uc's , worse at night-when she is awake. States has about 2 per hour.  States went to hospital and gave her medicine .

## 2020-09-28 LAB — CBC
Hematocrit: 31.5 % — ABNORMAL LOW (ref 34.0–46.6)
Hemoglobin: 10.4 g/dL — ABNORMAL LOW (ref 11.1–15.9)
MCH: 30.5 pg (ref 26.6–33.0)
MCHC: 33 g/dL (ref 31.5–35.7)
MCV: 92 fL (ref 79–97)
Platelets: 231 10*3/uL (ref 150–450)
RBC: 3.41 x10E6/uL — ABNORMAL LOW (ref 3.77–5.28)
RDW: 12.3 % (ref 11.7–15.4)
WBC: 7.2 10*3/uL (ref 3.4–10.8)

## 2020-09-28 LAB — HIV ANTIBODY (ROUTINE TESTING W REFLEX): HIV Screen 4th Generation wRfx: NONREACTIVE

## 2020-09-28 LAB — GLUCOSE TOLERANCE, 2 HOURS W/ 1HR
Glucose, 1 hour: 146 mg/dL (ref 65–179)
Glucose, 2 hour: 89 mg/dL (ref 65–152)
Glucose, Fasting: 74 mg/dL (ref 65–91)

## 2020-09-28 LAB — RPR: RPR Ser Ql: NONREACTIVE

## 2020-10-03 ENCOUNTER — Inpatient Hospital Stay (HOSPITAL_COMMUNITY)
Admission: AD | Admit: 2020-10-03 | Discharge: 2020-10-03 | Disposition: A | Discharge status: Home / Self Care | Payer: Medicaid Other | Attending: Obstetrics and Gynecology | Admitting: Obstetrics and Gynecology

## 2020-10-03 ENCOUNTER — Other Ambulatory Visit: Payer: Self-pay

## 2020-10-03 DIAGNOSIS — O26892 Other specified pregnancy related conditions, second trimester: Secondary | ICD-10-CM | POA: Insufficient documentation

## 2020-10-03 DIAGNOSIS — O99282 Endocrine, nutritional and metabolic diseases complicating pregnancy, second trimester: Secondary | ICD-10-CM | POA: Diagnosis not present

## 2020-10-03 DIAGNOSIS — Z87891 Personal history of nicotine dependence: Secondary | ICD-10-CM | POA: Diagnosis not present

## 2020-10-03 DIAGNOSIS — Z3A27 27 weeks gestation of pregnancy: Secondary | ICD-10-CM

## 2020-10-03 DIAGNOSIS — E860 Dehydration: Secondary | ICD-10-CM | POA: Insufficient documentation

## 2020-10-03 DIAGNOSIS — O99891 Other specified diseases and conditions complicating pregnancy: Secondary | ICD-10-CM

## 2020-10-03 DIAGNOSIS — R682 Dry mouth, unspecified: Secondary | ICD-10-CM

## 2020-10-03 DIAGNOSIS — Z7982 Long term (current) use of aspirin: Secondary | ICD-10-CM | POA: Diagnosis not present

## 2020-10-03 DIAGNOSIS — O34219 Maternal care for unspecified type scar from previous cesarean delivery: Secondary | ICD-10-CM | POA: Diagnosis not present

## 2020-10-03 DIAGNOSIS — Z7951 Long term (current) use of inhaled steroids: Secondary | ICD-10-CM | POA: Diagnosis not present

## 2020-10-03 DIAGNOSIS — Z79899 Other long term (current) drug therapy: Secondary | ICD-10-CM | POA: Diagnosis not present

## 2020-10-03 DIAGNOSIS — R350 Frequency of micturition: Secondary | ICD-10-CM | POA: Diagnosis present

## 2020-10-03 DIAGNOSIS — O0990 Supervision of high risk pregnancy, unspecified, unspecified trimester: Secondary | ICD-10-CM

## 2020-10-03 DIAGNOSIS — O09899 Supervision of other high risk pregnancies, unspecified trimester: Secondary | ICD-10-CM

## 2020-10-03 LAB — URINALYSIS, ROUTINE W REFLEX MICROSCOPIC
Bilirubin Urine: NEGATIVE
Glucose, UA: NEGATIVE mg/dL
Hgb urine dipstick: NEGATIVE
Ketones, ur: NEGATIVE mg/dL
Nitrite: NEGATIVE
Protein, ur: NEGATIVE mg/dL
Specific Gravity, Urine: 1.01 (ref 1.005–1.030)
pH: 8 (ref 5.0–8.0)
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
## 2020-10-03 NOTE — Discharge Instructions (Signed)
Rehydration, Adult Rehydration is the replacement of body fluids, salts, and minerals (electrolytes) that are lost during dehydration. Dehydration is when there is not enough water or other fluids in the body. This happens when you lose more fluids than you take in. Common causes of dehydration include:  Not drinking enough fluids. This can occur when you are ill or doing activities that require a lot of energy, especially in hot weather.  Conditions that cause loss of water or other fluids, such as diarrhea, vomiting, sweating, or urinating a lot.  Other illnesses, such as fever or infection.  Certain medicines, such as those that remove excess fluid from the body (diuretics). Symptoms of mild or moderate dehydration may include thirst, dry lips and mouth, and dizziness. Symptoms of severe dehydration may include increased heart rate, confusion, fainting, and not urinating. For severe dehydration, you may need to get fluids through an IV at the hospital. For mild or moderate dehydration, you can usually rehydrate at home by drinking certain fluids as told by your health care provider. What are the risks? Generally, rehydration is safe. However, taking in too much fluid (overhydration) can be a problem. This is rare. Overhydration can cause an electrolyte imbalance, kidney failure, or a decrease in salt (sodium) levels in the body. Supplies needed You will need an oral rehydration solution (ORS) if your health care provider tells you to use one. This is a drink to treat dehydration. It can be found in pharmacies and retail stores. How to rehydrate Fluids Follow instructions from your health care provider for rehydration. The kind of fluid and the amount you should drink depend on your condition. In general, you should choose drinks that you prefer.  If told by your health care provider, drink an ORS. ? Make an ORS by following instructions on the package. ? Start by drinking small amounts,  about  cup (120 mL) every 5-10 minutes. ? Slowly increase how much you drink until you have taken the amount recommended by your health care provider.  Drink enough clear fluids to keep your urine pale yellow. If you were told to drink an ORS, finish it first, then start slowly drinking other clear fluids. Drink fluids such as: ? Water. This includes sparkling water and flavored water. Drinking only water can lead to having too little sodium in your body (hyponatremia). Follow the advice of your health care provider. ? Water from ice chips you suck on. ? Fruit juice with water you add to it (diluted). ? Sports drinks. ? Hot or cold herbal teas. ? Broth-based soups. ? Milk or milk products. Food Follow instructions from your health care provider about what to eat while you rehydrate. Your health care provider may recommend that you slowly begin eating regular foods in small amounts.  Eat foods that contain a healthy balance of electrolytes, such as bananas, oranges, potatoes, tomatoes, and spinach.  Avoid foods that are greasy or contain a lot of sugar. In some cases, you may get nutrition through a feeding tube that is passed through your nose and into your stomach (nasogastric tube, or NG tube). This may be done if you have uncontrolled vomiting or diarrhea.   Beverages to avoid Certain beverages may make dehydration worse. While you rehydrate, avoid drinking alcohol.   How to tell if you are recovering from dehydration You may be recovering from dehydration if:  You are urinating more often than before you started rehydrating.  Your urine is pale yellow.  Your energy level   improves.  You vomit less frequently.  You have diarrhea less frequently.  Your appetite improves or returns to normal.  You feel less dizzy or less light-headed.  Your skin tone and color start to look more normal. Follow these instructions at home:  Take over-the-counter and prescription medicines only  as told by your health care provider.  Do not take sodium tablets. Doing this can lead to having too much sodium in your body (hypernatremia). Contact a health care provider if:  You continue to have symptoms of mild or moderate dehydration, such as: ? Thirst. ? Dry lips. ? Slightly dry mouth. ? Dizziness. ? Dark urine or less urine than normal. ? Muscle cramps.  You continue to vomit or have diarrhea. Get help right away if you:  Have symptoms of dehydration that get worse.  Have a fever.  Have a severe headache.  Have been vomiting and the following happens: ? Your vomiting gets worse or does not go away. ? Your vomit includes blood or green matter (bile). ? You cannot eat or drink without vomiting.  Have problems with urination or bowel movements, such as: ? Diarrhea that gets worse or does not go away. ? Blood in your stool (feces). This may cause stool to look black and tarry. ? Not urinating, or urinating only a small amount of very dark urine, within 6-8 hours.  Have trouble breathing.  Have symptoms that get worse with treatment. These symptoms may represent a serious problem that is an emergency. Do not wait to see if the symptoms will go away. Get medical help right away. Call your local emergency services (911 in the [place] Do not drive yourself to the hospital. Summary  Rehydration is the replacement of body fluids and minerals (electrolytes) that are lost during dehydration.  Follow instructions from your health care provider for rehydration. The kind of fluid and amount you should drink depend on your condition.  Slowly increase how much you drink until you have taken the amount recommended by your health care provider.  Contact your health care provider if you continue to show signs of mild or moderate dehydration. This information is not intended to replace advice given to you by your health care provider. Make sure you discuss any questions you have with  your health care provider. Document Revised: 08/31/2019 Document Reviewed: 07/11/2019 Elsevier Patient Education  2021 Elsevier Inc.  

## 2020-10-03 NOTE — MAU Note (Signed)
...  Jodi Daniel is a 25 y.o. at [redacted]w[redacted]d with Di/Di twins here in MAU reporting: She feels as if she is dehydrated. She states she "cannot spit a majority of the time" and reports she has "cotton mouth and my lips are so dry." She further states "I feel like I can't keep anything in me because when I drink I pee soon after. The doctor even tells me it is hard to find my veins so they keep telling me to drink more water and I tell them there is no way I could drink more. I also usually swell in pregnancy and I ain't even doing that." I was also wondering if they could check my amniotic fluid because "hers is fine and for him they are going to look at it later and they are both different." She states she is having [employer] CTX periodically. Next OB appointment in April. No VB but states an increase in watery discharge that began two weeks ago.  Pain score: No pain  BP: 132/63 P: 99 T: 98.0 R: 17 O2: 100  Lab orders placed from triage:  UA

## 2020-10-04 ENCOUNTER — Ambulatory Visit: Payer: Medicaid Other

## 2020-10-04 LAB — CULTURE, OB URINE

## 2020-10-10 ENCOUNTER — Encounter: Payer: Self-pay | Admitting: *Deleted

## 2020-10-11 ENCOUNTER — Ambulatory Visit: Payer: Medicaid Other

## 2020-10-11 ENCOUNTER — Ambulatory Visit (INDEPENDENT_AMBULATORY_CARE_PROVIDER_SITE_OTHER): Payer: Medicaid Other | Admitting: Family Medicine

## 2020-10-11 ENCOUNTER — Other Ambulatory Visit: Payer: Self-pay

## 2020-10-11 ENCOUNTER — Encounter: Payer: Self-pay | Admitting: Family Medicine

## 2020-10-11 ENCOUNTER — Other Ambulatory Visit (HOSPITAL_COMMUNITY)
Admission: RE | Admit: 2020-10-11 | Discharge: 2020-10-11 | Disposition: A | Discharge status: Home / Self Care | Payer: Medicaid Other | Source: Ambulatory Visit | Attending: Family Medicine | Admitting: Family Medicine

## 2020-10-11 VITALS — BP 119/79 | HR 109 | Wt 238.4 lb

## 2020-10-11 DIAGNOSIS — O0990 Supervision of high risk pregnancy, unspecified, unspecified trimester: Secondary | ICD-10-CM | POA: Insufficient documentation

## 2020-10-11 DIAGNOSIS — O09899 Supervision of other high risk pregnancies, unspecified trimester: Secondary | ICD-10-CM

## 2020-10-11 DIAGNOSIS — O30043 Twin pregnancy, dichorionic/diamniotic, third trimester: Secondary | ICD-10-CM

## 2020-10-11 DIAGNOSIS — O99210 Obesity complicating pregnancy, unspecified trimester: Secondary | ICD-10-CM

## 2020-10-11 DIAGNOSIS — K219 Gastro-esophageal reflux disease without esophagitis: Secondary | ICD-10-CM

## 2020-10-11 DIAGNOSIS — Z98891 History of uterine scar from previous surgery: Secondary | ICD-10-CM

## 2020-10-11 DIAGNOSIS — R519 Headache, unspecified: Secondary | ICD-10-CM

## 2020-10-11 DIAGNOSIS — O26899 Other specified pregnancy related conditions, unspecified trimester: Secondary | ICD-10-CM

## 2020-10-11 MED ORDER — CYCLOBENZAPRINE HCL 10 MG PO TABS
10.0000 mg | ORAL_TABLET | Freq: Three times a day (TID) | ORAL | 1 refills | Status: DC | PRN
Start: 2020-10-11 — End: 2020-11-12

## 2020-10-11 MED ORDER — FOLIC ACID 1 MG PO TABS: 1.0000 mg | ORAL_TABLET | Freq: Every day | ORAL | 4 refills | Status: DC

## 2020-10-11 MED ORDER — SUCRALFATE 1 GM/10ML PO SUSP: 1.0000 g | Freq: Three times a day (TID) | ORAL | 0 refills | Status: DC

## 2020-10-11 NOTE — Progress Notes (Signed)
PRENATAL VISIT NOTE  Subjective:  Jodi Daniel is a 25 y.o. [id number] at [redacted]w[redacted]d being seen today for ongoing prenatal care.  She is currently monitored for the following issues for this low-risk pregnancy and has History of cesarean section followed by successful vaginal birth (VBAC); History of placenta abruption; Anxiety and depression; Headache in pregnancy, antepartum; Asthma affecting pregnancy in second trimester; Dichorionic diamniotic twin gestation; Supervision of high risk pregnancy, antepartum; History of preterm delivery, currently pregnant; BMI 30s; and Obesity in pregnancy on their problem list.  Patient reports no complaints.  Contractions: Irregular. Vag. Bleeding: None.  Movement: Present. Denies leaking of fluid.   The following portions of the patient's history were reviewed and updated as appropriate: allergies, current medications, past family history, past medical history, past social history, past surgical history and problem list.   Objective:   Vitals:   10/11/20 0934  BP: 119/79  Pulse: (!) 109  Weight: 238 lb 6.4 oz (108.1 kg)    Fetal Status:     Movement: Present     General:  Alert, oriented and cooperative. Patient is in no acute distress.  Skin: Skin is warm and dry. No rash noted.   Cardiovascular: Normal heart rate noted  Respiratory: Normal respiratory effort, no problems with respiration noted  Abdomen: Soft, gravid, appropriate for gestational age.  Pain/Pressure: Present     Pelvic: Cervical exam deferred        Extremities: Normal range of motion.  Edema: None  Mental Status: Normal mood and affect. Normal behavior. Normal judgment and thought content.   Assessment and Plan:  Pregnancy: [id number] at [redacted]w[redacted]d 1. History of cesarean section followed by successful vaginal birth (VBAC) Planning on RCS with BTL  2. History of preterm delivery, currently pregnant No 17 P this pregnancy  3. Supervision of high risk pregnancy, antepartum Up to  date Reviewed 3rd trimester labs Having back and hip pain. Recommended stretches, belly band.  Needed refill for folic acid - folic acid (FOLVITE) 1 MG tablet; Take 1 tablet (1 mg total) by mouth daily.  Dispense: 60 tablet; Refill: 4 - sucralfate (CARAFATE) 1 GM/10ML suspension; Take 10 mLs (1 g total) by mouth 4 (four) times daily -  with meals and at bedtime.  Dispense: 420 mL; Refill: 0  C/o vaginal discharge - Cervicovaginal ancillary only( [facility])  4. Obesity in pregnancy TWG=43 lb 6.4 oz (19.7 kg) which is above goal for starting weight.   5. Dichorionic diamniotic twin pregnancy in third trimester No discordance Girl/boy twins :) Mom very aware of the babies positions.  Has appropriate MFM follow up Planning on CS  6. Gastroesophageal reflux disease, unspecified whether esophagitis present Worsening, takes pepcid and uses TUMs - sucralfate (CARAFATE) 1 GM/10ML suspension; Take 10 mLs (1 g total) by mouth 4 (four) times daily -  with meals and at bedtime.  Dispense: 420 mL; Refill: 0  7. Headache in pregnancy, antepartum Reviewed as normal to increase, BP is not concerning today Reviewed trial of medication and patient accepted. Counseled on sedating effects.  - cyclobenzaprine (FLEXERIL) 10 MG tablet; Take 1 tablet (10 mg total) by mouth every 8 (eight) hours as needed for muscle spasms.  Dispense: 30 tablet; Refill: 1  Preterm labor symptoms and general obstetric precautions including but not limited to vaginal bleeding, contractions, leaking of fluid and fetal movement were reviewed in detail with the patient. Please refer to After Visit Summary for other counseling recommendations.   Return in about 2 weeks (  around 10/25/2020) for Routine prenatal care, MD only.  Future Appointments  Date Time Provider Department Center  10/25/2020  1:45 PM [facility] NURSE [facility]  10/25/2020  2:00 PM [facility] US1 [facility]  10/25/2020  3:55 PM Reva Bores, MD [facility]     Federico Flake, MD

## 2020-10-12 LAB — CERVICOVAGINAL ANCILLARY ONLY
Bacterial Vaginitis (gardnerella): NEGATIVE
Chlamydia: NEGATIVE
Comment: NEGATIVE
Comment: NEGATIVE
Comment: NEGATIVE
Comment: NORMAL
Neisseria Gonorrhea: NEGATIVE
Trichomonas: NEGATIVE

## 2020-10-15 ENCOUNTER — Ambulatory Visit: Payer: Medicaid Other

## 2020-10-18 ENCOUNTER — Ambulatory Visit: Payer: Medicaid Other

## 2020-10-21 ENCOUNTER — Inpatient Hospital Stay (HOSPITAL_COMMUNITY)
Admission: AD | Admit: 2020-10-21 | Discharge: 2020-10-21 | Disposition: A | Discharge status: Home / Self Care | Payer: Medicaid Other | Attending: Family Medicine | Admitting: Family Medicine

## 2020-10-21 ENCOUNTER — Inpatient Hospital Stay (HOSPITAL_BASED_OUTPATIENT_CLINIC_OR_DEPARTMENT_OTHER): Payer: Medicaid Other

## 2020-10-21 ENCOUNTER — Other Ambulatory Visit: Payer: Self-pay

## 2020-10-21 DIAGNOSIS — J45909 Unspecified asthma, uncomplicated: Secondary | ICD-10-CM | POA: Insufficient documentation

## 2020-10-21 DIAGNOSIS — O30043 Twin pregnancy, dichorionic/diamniotic, third trimester: Secondary | ICD-10-CM | POA: Diagnosis not present

## 2020-10-21 DIAGNOSIS — Z87891 Personal history of nicotine dependence: Secondary | ICD-10-CM | POA: Insufficient documentation

## 2020-10-21 DIAGNOSIS — O0990 Supervision of high risk pregnancy, unspecified, unspecified trimester: Secondary | ICD-10-CM

## 2020-10-21 DIAGNOSIS — O288 Other abnormal findings on antenatal screening of mother: Secondary | ICD-10-CM | POA: Diagnosis not present

## 2020-10-21 DIAGNOSIS — O09899 Supervision of other high risk pregnancies, unspecified trimester: Secondary | ICD-10-CM

## 2020-10-21 DIAGNOSIS — O30003 Twin pregnancy, unspecified number of placenta and unspecified number of amniotic sacs, third trimester: Secondary | ICD-10-CM | POA: Diagnosis not present

## 2020-10-21 DIAGNOSIS — R102 Pelvic and perineal pain: Secondary | ICD-10-CM

## 2020-10-21 DIAGNOSIS — Z3A30 30 weeks gestation of pregnancy: Secondary | ICD-10-CM | POA: Insufficient documentation

## 2020-10-21 DIAGNOSIS — O26893 Other specified pregnancy related conditions, third trimester: Secondary | ICD-10-CM

## 2020-10-21 DIAGNOSIS — O99513 Diseases of the respiratory system complicating pregnancy, third trimester: Secondary | ICD-10-CM | POA: Diagnosis not present

## 2020-10-21 DIAGNOSIS — Z79899 Other long term (current) drug therapy: Secondary | ICD-10-CM | POA: Insufficient documentation

## 2020-10-21 DIAGNOSIS — O09213 Supervision of pregnancy with history of pre-term labor, third trimester: Secondary | ICD-10-CM

## 2020-10-21 DIAGNOSIS — Z7951 Long term (current) use of inhaled steroids: Secondary | ICD-10-CM | POA: Diagnosis not present

## 2020-10-21 DIAGNOSIS — Z7982 Long term (current) use of aspirin: Secondary | ICD-10-CM | POA: Diagnosis not present

## 2020-10-21 DIAGNOSIS — O99213 Obesity complicating pregnancy, third trimester: Secondary | ICD-10-CM | POA: Insufficient documentation

## 2020-10-21 DIAGNOSIS — O322XX2 Maternal care for transverse and oblique lie, fetus 2: Secondary | ICD-10-CM

## 2020-10-21 DIAGNOSIS — M5431 Sciatica, right side: Secondary | ICD-10-CM | POA: Diagnosis not present

## 2020-10-21 DIAGNOSIS — O368332 Maternal care for abnormalities of the fetal heart rate or rhythm, third trimester, fetus 2: Secondary | ICD-10-CM

## 2020-10-21 MED ORDER — LACTATED RINGERS IV BOLUS
1000.0000 mL | Freq: Once | INTRAVENOUS | Status: AC
  Administered 2020-10-21: 1000 mL via INTRAVENOUS

## 2020-10-21 MED ORDER — OXYCODONE-ACETAMINOPHEN 5-325 MG PO TABS
2.0000 | ORAL_TABLET | Freq: Once | ORAL | Status: AC
  Administered 2020-10-21: 2 via ORAL
  Filled 2020-10-21: qty 2

## 2020-10-21 NOTE — Discharge Instructions (Signed)
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
## 2020-10-21 NOTE — MAU Note (Signed)
Pt presetns to MAU with c/o of sharp leg pain on her right side where she "cant bear weight." She states it radiates around to her butt. This pain started 4/9. Call on-call nurse and was told to come to MAU. Pt took muscle relaxer @ 2100 with no relief. Endorses +FM.

## 2020-10-22 ENCOUNTER — Encounter: Payer: Self-pay | Admitting: *Deleted

## 2020-10-25 ENCOUNTER — Ambulatory Visit: Payer: Medicaid Other | Admitting: *Deleted

## 2020-10-25 ENCOUNTER — Other Ambulatory Visit: Payer: Self-pay

## 2020-10-25 ENCOUNTER — Ambulatory Visit: Payer: Medicaid Other | Attending: Obstetrics

## 2020-10-25 ENCOUNTER — Encounter: Payer: Self-pay | Admitting: Family Medicine

## 2020-10-25 ENCOUNTER — Ambulatory Visit (INDEPENDENT_AMBULATORY_CARE_PROVIDER_SITE_OTHER): Payer: Medicaid Other | Admitting: Family Medicine

## 2020-10-25 ENCOUNTER — Encounter: Payer: Self-pay | Admitting: *Deleted

## 2020-10-25 VITALS — BP 115/66 | HR 87 | Wt 244.1 lb

## 2020-10-25 DIAGNOSIS — O09899 Supervision of other high risk pregnancies, unspecified trimester: Secondary | ICD-10-CM

## 2020-10-25 DIAGNOSIS — O09213 Supervision of pregnancy with history of pre-term labor, third trimester: Secondary | ICD-10-CM

## 2020-10-25 DIAGNOSIS — O30043 Twin pregnancy, dichorionic/diamniotic, third trimester: Secondary | ICD-10-CM

## 2020-10-25 DIAGNOSIS — O34219 Maternal care for unspecified type scar from previous cesarean delivery: Secondary | ICD-10-CM | POA: Diagnosis not present

## 2020-10-25 DIAGNOSIS — O0990 Supervision of high risk pregnancy, unspecified, unspecified trimester: Secondary | ICD-10-CM | POA: Diagnosis present

## 2020-10-25 DIAGNOSIS — O30042 Twin pregnancy, dichorionic/diamniotic, second trimester: Secondary | ICD-10-CM | POA: Insufficient documentation

## 2020-10-25 DIAGNOSIS — Z3A30 30 weeks gestation of pregnancy: Secondary | ICD-10-CM

## 2020-10-25 DIAGNOSIS — O321XX2 Maternal care for breech presentation, fetus 2: Secondary | ICD-10-CM | POA: Diagnosis not present

## 2020-10-25 DIAGNOSIS — Z98891 History of uterine scar from previous surgery: Secondary | ICD-10-CM

## 2020-10-25 NOTE — Patient Instructions (Signed)

## 2020-10-25 NOTE — Progress Notes (Signed)
   PRENATAL VISIT NOTE  Subjective:  Jodi Daniel is a 25 y.o. [id number] at [redacted]w[redacted]d being seen today for ongoing prenatal care.  She is currently monitored for the following issues for this high-risk pregnancy and has History of cesarean section followed by successful vaginal birth (VBAC); History of placenta abruption; Anxiety and depression; Headache in pregnancy, antepartum; Asthma affecting pregnancy in second trimester; Dichorionic diamniotic twin gestation; Supervision of high risk pregnancy, antepartum; History of preterm delivery, currently pregnant; BMI 30s; and Obesity in pregnancy on their problem list.  Patient reports no complaints.  Contractions: Irritability. Vag. Bleeding: None.  Movement: Present. Denies leaking of fluid.   The following portions of the patient's history were reviewed and updated as appropriate: allergies, current medications, past family history, past medical history, past social history, past surgical history and problem list.   Objective:   Vitals:   10/25/20 1603  BP: 115/66  Pulse: 87  Weight: 244 lb 1.6 oz (110.7 kg)    Fetal Status: Fetal Heart Rate (bpm): 144/134   Movement: Present     General:  Alert, oriented and cooperative. Patient is in no acute distress.  Skin: Skin is warm and dry. No rash noted.   Cardiovascular: Normal heart rate noted  Respiratory: Normal respiratory effort, no problems with respiration noted  Abdomen: Soft, gravid, appropriate for gestational age.  Pain/Pressure: Present     Pelvic: Cervical exam deferred        Extremities: Normal range of motion.  Edema: None  Mental Status: Normal mood and affect. Normal behavior. Normal judgment and thought content.   Assessment and Plan:  Pregnancy: [id number] at [redacted]w[redacted]d 1. Supervision of high risk pregnancy, antepartum Continue prenatal care.   2. History of preterm delivery, currently pregnant No Makena due to multifetal gestation  3. History of cesarean section followed  by successful vaginal birth (VBAC) Declines attempts at [facility] despite baby A being vtx. Desires BTL. Scheduled for 38 wks. EDC changed to LMP due to agreement with 7 wk u/s at that time.  4. Dichorionic diamniotic twin pregnancy in third trimester Concordant growth in MFM today. A vtx 3 lb 11 oz, B 3 lbs 8 oz breech 5% discordance  Preterm labor symptoms and general obstetric precautions including but not limited to vaginal bleeding, contractions, leaking of fluid and fetal movement were reviewed in detail with the patient. Please refer to After Visit Summary for other counseling recommendations.   Return in 2 weeks (on 11/08/2020).  Future Appointments  Date Time Provider Department Center  11/08/2020  1:15 PM Adam Phenix, MD [facility]  11/22/2020  1:30 PM [facility] NURSE [facility]  11/22/2020  1:45 PM [facility] US4 [facility]    Jodi Bores, MD

## 2020-10-29 ENCOUNTER — Telehealth: Payer: Self-pay | Admitting: *Deleted

## 2020-10-29 ENCOUNTER — Other Ambulatory Visit: Payer: Self-pay | Admitting: *Deleted

## 2020-10-29 ENCOUNTER — Encounter: Payer: Self-pay | Admitting: *Deleted

## 2020-10-29 DIAGNOSIS — O30043 Twin pregnancy, dichorionic/diamniotic, third trimester: Secondary | ICD-10-CM

## 2020-10-29 NOTE — Telephone Encounter (Signed)
Call to patient. Left message surgery scheduled for 12-13-20 at 0930, arrive 0730.  Left message letter will be mailed and available in My Chart.  [place] closed.

## 2020-11-08 ENCOUNTER — Encounter: Payer: Medicaid Other | Admitting: Obstetrics & Gynecology

## 2020-11-12 ENCOUNTER — Encounter: Payer: Self-pay | Admitting: Obstetrics and Gynecology

## 2020-11-12 ENCOUNTER — Other Ambulatory Visit: Payer: Self-pay

## 2020-11-12 ENCOUNTER — Ambulatory Visit (INDEPENDENT_AMBULATORY_CARE_PROVIDER_SITE_OTHER): Payer: Medicaid Other | Admitting: Obstetrics and Gynecology

## 2020-11-12 VITALS — BP 139/78 | HR 109 | Wt 251.0 lb

## 2020-11-12 DIAGNOSIS — J45909 Unspecified asthma, uncomplicated: Secondary | ICD-10-CM

## 2020-11-12 DIAGNOSIS — F419 Anxiety disorder, unspecified: Secondary | ICD-10-CM

## 2020-11-12 DIAGNOSIS — O26899 Other specified pregnancy related conditions, unspecified trimester: Secondary | ICD-10-CM

## 2020-11-12 DIAGNOSIS — O99512 Diseases of the respiratory system complicating pregnancy, second trimester: Secondary | ICD-10-CM

## 2020-11-12 DIAGNOSIS — Z98891 History of uterine scar from previous surgery: Secondary | ICD-10-CM

## 2020-11-12 DIAGNOSIS — R519 Headache, unspecified: Secondary | ICD-10-CM

## 2020-11-12 DIAGNOSIS — F32A Depression, unspecified: Secondary | ICD-10-CM

## 2020-11-12 DIAGNOSIS — O0990 Supervision of high risk pregnancy, unspecified, unspecified trimester: Secondary | ICD-10-CM

## 2020-11-12 DIAGNOSIS — Z3009 Encounter for other general counseling and advice on contraception: Secondary | ICD-10-CM

## 2020-11-12 DIAGNOSIS — O30043 Twin pregnancy, dichorionic/diamniotic, third trimester: Secondary | ICD-10-CM

## 2020-11-12 MED ORDER — CYCLOBENZAPRINE HCL 10 MG PO TABS
10.0000 mg | ORAL_TABLET | Freq: Three times a day (TID) | ORAL | 1 refills | Status: DC | PRN
Start: 2020-11-12 — End: 2020-11-17

## 2020-11-12 MED ORDER — CYCLOBENZAPRINE HCL 10 MG PO TABS: 10.0000 mg | ORAL_TABLET | Freq: Three times a day (TID) | ORAL | 1 refills | Status: DC | PRN

## 2020-11-12 NOTE — Patient Instructions (Signed)

## 2020-11-12 NOTE — Addendum Note (Signed)
Addended by: Hermina Staggers on: 11/12/2020 11:19 AM   Modules accepted: Orders

## 2020-11-12 NOTE — BH Specialist Note (Signed)
Pt did not arrive to video visit and did not answer the phone ; Left HIPPA-compliant message to call back Tesla Bochicchio from [facility] at [phone] (main office) or [phone] (Keiona Jenison's office).  ; left MyChart message for patient.      

## 2020-11-12 NOTE — Progress Notes (Signed)
Subjective:  Jodi Daniel is a 25 y.o. [id number] at [redacted]w[redacted]d being seen today for ongoing prenatal care.  She is currently monitored for the following issues for this high-risk pregnancy and has History of cesarean section followed by successful vaginal birth (VBAC); History of placenta abruption; Anxiety and depression; Headache in pregnancy, antepartum; Asthma affecting pregnancy in second trimester; Dichorionic diamniotic twin gestation; Supervision of high risk pregnancy, antepartum; History of preterm delivery, currently pregnant; BMI 30s; Obesity in pregnancy; and Unwanted fertility on their problem list.  Patient reports general discomforts of pregnancy.  Contractions: Irritability. Vag. Bleeding: None.  Movement: Present. Denies leaking of fluid.   The following portions of the patient's history were reviewed and updated as appropriate: allergies, current medications, past family history, past medical history, past social history, past surgical history and problem list. Problem list updated.  Objective:   Vitals:   11/12/20 1030  BP: 139/78  Pulse: (!) 109  Weight: 251 lb (113.9 kg)    Fetal Status: Fetal Heart Rate (bpm): 137/131   Movement: Present     General:  Alert, oriented and cooperative. Patient is in no acute distress.  Skin: Skin is warm and dry. No rash noted.   Cardiovascular: Normal heart rate noted  Respiratory: Normal respiratory effort, no problems with respiration noted  Abdomen: Soft, gravid, appropriate for gestational age. Pain/Pressure: Present     Pelvic:  Cervical exam performed        Extremities: Normal range of motion.  Edema: Trace  Mental Status: Normal mood and affect. Normal behavior. Normal judgment and thought content.   Urinalysis:      Assessment and Plan:  Pregnancy: [id number] at [redacted]w[redacted]d  1. Supervision of high risk pregnancy, antepartum Labor precautions  2. Dichorionic diamniotic twin pregnancy in third trimester Growth scan next week C/S  and BTL scheduled  3. History of cesarean section followed by successful vaginal birth (VBAC) See Above  4. Unwanted fertility See above  5. Asthma affecting pregnancy in second trimester Stable  6. Anxiety and depression  - Ambulatory referral to [facility]  Preterm labor symptoms and general obstetric precautions including but not limited to vaginal bleeding, contractions, leaking of fluid and fetal movement were reviewed in detail with the patient. Please refer to After Visit Summary for other counseling recommendations.  Return in about 2 weeks (around 11/26/2020) for OB visit, face to face, MD only.   Hermina Staggers, MD

## 2020-11-15 ENCOUNTER — Telehealth: Payer: Self-pay | Admitting: *Deleted

## 2020-11-15 ENCOUNTER — Encounter: Payer: Self-pay | Admitting: *Deleted

## 2020-11-15 ENCOUNTER — Encounter: Payer: Self-pay | Admitting: General Practice

## 2020-11-15 NOTE — Progress Notes (Signed)
Updated LOA forms faxed and scanned to Epic.

## 2020-11-15 NOTE — Telephone Encounter (Signed)
Call to patient to review LOA forms. Initially completed for delivery on 12-13-20.  Patient has been unable to work since 10-08-20 due to twin gestation, asthma and SOB. 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  Advised reviewed with Dr Shawnie Pons and may begin FMLA leave but is advised FMLA is not restarted with deliver. Patient agreeable and wants effective date of 10-08-20.  Updated forms faxed as requested.   Encounter closed.

## 2020-11-17 ENCOUNTER — Encounter (HOSPITAL_COMMUNITY): Payer: Self-pay | Admitting: Obstetrics & Gynecology

## 2020-11-17 ENCOUNTER — Inpatient Hospital Stay (HOSPITAL_COMMUNITY)
Admission: AD | Admit: 2020-11-17 | Discharge: 2020-11-17 | Disposition: A | Discharge status: Home / Self Care | Payer: Medicaid Other | Attending: Obstetrics & Gynecology | Admitting: Obstetrics & Gynecology

## 2020-11-17 ENCOUNTER — Other Ambulatory Visit: Payer: Self-pay

## 2020-11-17 DIAGNOSIS — O26853 Spotting complicating pregnancy, third trimester: Secondary | ICD-10-CM | POA: Diagnosis not present

## 2020-11-17 DIAGNOSIS — O0990 Supervision of high risk pregnancy, unspecified, unspecified trimester: Secondary | ICD-10-CM

## 2020-11-17 DIAGNOSIS — N939 Abnormal uterine and vaginal bleeding, unspecified: Secondary | ICD-10-CM

## 2020-11-17 DIAGNOSIS — O4703 False labor before 37 completed weeks of gestation, third trimester: Secondary | ICD-10-CM | POA: Insufficient documentation

## 2020-11-17 DIAGNOSIS — Z3A34 34 weeks gestation of pregnancy: Secondary | ICD-10-CM | POA: Diagnosis not present

## 2020-11-17 DIAGNOSIS — Z3689 Encounter for other specified antenatal screening: Secondary | ICD-10-CM | POA: Diagnosis not present

## 2020-11-17 LAB — URINALYSIS, ROUTINE W REFLEX MICROSCOPIC
Bilirubin Urine: NEGATIVE
Glucose, UA: NEGATIVE mg/dL
Hgb urine dipstick: NEGATIVE
Ketones, ur: NEGATIVE mg/dL
Leukocytes,Ua: NEGATIVE
Nitrite: NEGATIVE
Protein, ur: NEGATIVE mg/dL
Specific Gravity, Urine: 1.014 (ref 1.005–1.030)
pH: 8 (ref 5.0–8.0)

## 2020-11-17 LAB — WET PREP, GENITAL
Clue Cells Wet Prep HPF POC: NONE SEEN
Sperm: NONE SEEN
Trich, Wet Prep: NONE SEEN
Yeast Wet Prep HPF POC: NONE SEEN

## 2020-11-17 LAB — FETAL FIBRONECTIN: Fetal Fibronectin: NEGATIVE

## 2020-11-17 MED ORDER — CYCLOBENZAPRINE HCL 10 MG PO TABS: 10.0000 mg | ORAL_TABLET | Freq: Three times a day (TID) | ORAL | 1 refills | Status: DC | PRN

## 2020-11-17 MED ORDER — FOLIC ACID 1 MG PO TABS: 1.0000 mg | ORAL_TABLET | Freq: Every day | ORAL | 4 refills | Status: DC

## 2020-11-17 MED ORDER — FAMOTIDINE 20 MG PO TABS: 20.0000 mg | ORAL_TABLET | Freq: Two times a day (BID) | ORAL | 1 refills | Status: AC

## 2020-11-17 MED ORDER — ASPIRIN EC 81 MG PO TBEC: 81.0000 mg | DELAYED_RELEASE_TABLET | Freq: Every day | ORAL | 1 refills | Status: DC

## 2020-11-17 NOTE — MAU Note (Signed)
Pt reports to mau with c/o vag spotting that started today.  Pt reports spotting is only when she wipes.  Denies ctx, but reports a constant pressure in pelvis.  Denies LOF. +FM.  PT reports an overall feeling of fatigue and being "very tired"

## 2020-11-17 NOTE — MAU Provider Note (Signed)
Event Date/Time   First Provider Initiated Contact with Patient 11/17/20 1816     S [name] is a 25 y.o. [phone] pregnant female at [redacted]w[redacted]d with di/di twins who presents to MAU today with complaint of two episodes of pink spotting with wiping starting at 11am today. Pt states she has been having strong BH contractions most nights, went to sleep at 4am, got up at 11am and noted pink when she wiped twice over the course of the afternoon. Came in to get checked as she has a history of PTL and is planning a RCS. Other than fatigue and occasional contractions, denies any other physical symptoms.  Pertinent items noted in HPI and remainder of comprehensive ROS otherwise negative.  O BP 120/70 (BP Location: Right Arm)   Pulse (!) 111   Temp 97.8 F (36.6 C) (Oral)   Resp 19   LMP 03/22/2020 (Approximate)   SpO2 100%  Physical Exam Vitals and nursing note reviewed.  Constitutional:      General: She is not in acute distress.    Appearance: She is not ill-appearing.     Comments: Appears fatigued but says she has BH all night and usually sleeps 4a-1p, only slept 4a-11a today.  HENT:     Head: Normocephalic and atraumatic.     Mouth/Throat:     Mouth: Mucous membranes are moist.  Eyes:     Pupils: Pupils are equal, round, and reactive to light.  Cardiovascular:     Rate and Rhythm: Normal rate.  Pulmonary:     Effort: Pulmonary effort is normal.  Abdominal:     Palpations: Abdomen is soft.     Tenderness: There is no abdominal tenderness.  Genitourinary:    General: Normal vulva.     Vagina: Vaginal discharge (physiologic) present.     Comments: 1/thick/posterior/-3 Exam by:: Edd Arbour, CNM Musculoskeletal:        General: Normal range of motion.  Skin:    General: Skin is warm and dry.     Capillary Refill: Capillary refill takes less than 2 seconds.  Neurological:     Mental Status: She is alert and oriented to person, place, and time.  Psychiatric:         Mood and Affect: Mood normal.        Behavior: Behavior normal.        Thought Content: Thought content normal.        Judgment: Judgment normal.    Fetal Tracing: reactive Baseline: 130/140 Variability: moderate Accelerations: 15x15 Decelerations: none Toco: relaxed with occasional UI  Results for orders placed or performed during the hospital encounter of 11/17/20 (from the past 24 hour(s))  Urinalysis, Routine w reflex microscopic Urine, Clean Catch     Status: Abnormal   Collection Time: 11/17/20  5:53 PM  Result Value Ref Range   Color, Urine YELLOW YELLOW   APPearance CLOUDY (A) CLEAR   Specific Gravity, Urine 1.014 1.005 - 1.030   pH 8.0 5.0 - 8.0   Glucose, UA NEGATIVE NEGATIVE mg/dL   Hgb urine dipstick NEGATIVE NEGATIVE   Bilirubin Urine NEGATIVE NEGATIVE   Ketones, ur NEGATIVE NEGATIVE mg/dL   Protein, ur NEGATIVE NEGATIVE mg/dL   Nitrite NEGATIVE NEGATIVE   Leukocytes,Ua NEGATIVE NEGATIVE  Wet prep, genital     Status: Abnormal   Collection Time: 11/17/20  7:03 PM   Specimen: Vaginal  Result Value Ref Range   Yeast Wet Prep HPF POC NONE SEEN NONE SEEN   Trich,  Wet Prep NONE SEEN NONE SEEN   Clue Cells Wet Prep HPF POC NONE SEEN NONE SEEN   WBC, Wet Prep HPF POC MODERATE (A) NONE SEEN   Sperm NONE SEEN   Fetal fibronectin     Status: None   Collection Time: 11/17/20  7:03 PM  Result Value Ref Range   Fetal Fibronectin NEGATIVE NEGATIVE    A Vaginal spotting at [redacted] weeks gestation Preterm contractions NST reactive - pt requested pregnancy related prescriptions be sent to her new pharmacy (moved from [place])  P Discharge from MAU in stable condition with preterm labor precautions Follow up at [facility] as scheduled for ongoing prenatal care Prescriptions refilled and sent to new pharmacy  Bernerd Limbo, CNM 11/17/2020 8:41 PM

## 2020-11-17 NOTE — Discharge Instructions (Signed)

## 2020-11-19 LAB — GC/CHLAMYDIA PROBE AMP (~~LOC~~) NOT AT ARMC
Chlamydia: NEGATIVE
Comment: NEGATIVE
Comment: NORMAL
Neisseria Gonorrhea: NEGATIVE

## 2020-11-21 ENCOUNTER — Other Ambulatory Visit: Payer: Self-pay

## 2020-11-21 NOTE — Progress Notes (Signed)
Fax received stating folic acid order is not preferred by patient's insurance. Called pharmacy. Pharmacy staff states Medicaid will not pay for any Folic Acid rx. Discount code was applied to this medication to reduce cost. Available for pick up.

## 2020-11-22 ENCOUNTER — Ambulatory Visit: Payer: Medicaid Other | Admitting: *Deleted

## 2020-11-22 ENCOUNTER — Other Ambulatory Visit: Payer: Self-pay | Admitting: Maternal & Fetal Medicine

## 2020-11-22 ENCOUNTER — Ambulatory Visit: Payer: Medicaid Other | Attending: Maternal & Fetal Medicine

## 2020-11-22 ENCOUNTER — Other Ambulatory Visit: Payer: Self-pay

## 2020-11-22 DIAGNOSIS — Z362 Encounter for other antenatal screening follow-up: Secondary | ICD-10-CM | POA: Diagnosis not present

## 2020-11-22 DIAGNOSIS — O0990 Supervision of high risk pregnancy, unspecified, unspecified trimester: Secondary | ICD-10-CM

## 2020-11-22 DIAGNOSIS — O30043 Twin pregnancy, dichorionic/diamniotic, third trimester: Secondary | ICD-10-CM | POA: Diagnosis not present

## 2020-11-22 DIAGNOSIS — O34219 Maternal care for unspecified type scar from previous cesarean delivery: Secondary | ICD-10-CM

## 2020-11-22 DIAGNOSIS — O09899 Supervision of other high risk pregnancies, unspecified trimester: Secondary | ICD-10-CM

## 2020-11-22 DIAGNOSIS — Z3A34 34 weeks gestation of pregnancy: Secondary | ICD-10-CM

## 2020-11-22 DIAGNOSIS — O09213 Supervision of pregnancy with history of pre-term labor, third trimester: Secondary | ICD-10-CM

## 2020-11-23 ENCOUNTER — Ambulatory Visit: Payer: Self-pay | Admitting: Clinical

## 2020-11-23 DIAGNOSIS — Z91199 Patient's noncompliance with other medical treatment and regimen due to unspecified reason: Secondary | ICD-10-CM

## 2020-11-23 NOTE — Patient Instructions (Addendum)
Jodi Daniel  11/23/2020   Your procedure is scheduled on:  12/13/2020  Arrive at 0730 at Entrance C on [address] at [facility]  and [address]. You are invited to use the FREE valet parking or use the Visitor's parking deck.  Pick up the phone at the desk and dial [phone].  Call this number if you have problems the morning of surgery: [phone]  Remember:   Do not eat food:(After Midnight) Desps de medianoche.  Do not drink clear liquids: (After Midnight) Desps de medianoche.  Take these medicines the morning of surgery with A SIP OF WATER:  May take pepcid. Bring any inhalers with you   Do not wear jewelry, make-up or nail polish.  Do not wear lotions, powders, or perfumes. Do not wear deodorant.  Do not shave 48 hours prior to surgery.  Do not bring valuables to the hospital.  [facility] is not   responsible for any belongings or valuables brought to the hospital.  Contacts, dentures or bridgework may not be worn into surgery.  Leave suitcase in the car. After surgery it may be brought to your room.  For patients admitted to the hospital, checkout time is 11:00 AM the day of              discharge.      Please read over the following fact sheets that you were given:     Preparing for Surgery

## 2020-11-25 DIAGNOSIS — O30043 Twin pregnancy, dichorionic/diamniotic, third trimester: Secondary | ICD-10-CM | POA: Diagnosis not present

## 2020-11-25 DIAGNOSIS — O09293 Supervision of pregnancy with other poor reproductive or obstetric history, third trimester: Secondary | ICD-10-CM

## 2020-11-25 DIAGNOSIS — O34219 Maternal care for unspecified type scar from previous cesarean delivery: Secondary | ICD-10-CM | POA: Diagnosis not present

## 2020-11-25 DIAGNOSIS — Z362 Encounter for other antenatal screening follow-up: Secondary | ICD-10-CM | POA: Diagnosis not present

## 2020-11-26 ENCOUNTER — Other Ambulatory Visit: Payer: Self-pay

## 2020-11-26 ENCOUNTER — Ambulatory Visit (INDEPENDENT_AMBULATORY_CARE_PROVIDER_SITE_OTHER): Payer: Medicaid Other | Admitting: Obstetrics & Gynecology

## 2020-11-26 ENCOUNTER — Encounter (HOSPITAL_COMMUNITY): Payer: Self-pay

## 2020-11-26 ENCOUNTER — Encounter: Payer: Self-pay | Admitting: Obstetrics & Gynecology

## 2020-11-26 VITALS — BP 120/83 | HR 102 | Wt 259.0 lb

## 2020-11-26 DIAGNOSIS — O30043 Twin pregnancy, dichorionic/diamniotic, third trimester: Secondary | ICD-10-CM

## 2020-11-26 NOTE — Patient Instructions (Signed)
Cesarean Delivery Cesarean birth, or cesarean delivery, is the surgical delivery of a baby through an incision in the abdomen and the uterus. This may be referred to as a C-section. This procedure may be scheduled ahead of time, or it may be done in an emergency situation. Tell a health care provider about:  Any allergies you have.  All medicines you are taking, including vitamins, herbs, eye drops, creams, and over-the-counter medicines.  Any problems you or family members have had with anesthetic medicines.  Any blood disorders you have.  Any surgeries you have had.  Any medical conditions you have.  Whether you or any members of your family have a history of deep vein thrombosis (DVT) or pulmonary embolism (PE). What are the risks? Generally, this is a safe procedure. However, problems may occur, including:  Infection.  Bleeding.  Allergic reactions to medicines.  Damage to other structures or organs.  Blood clots.  Injury to your baby. What happens before the procedure? General instructions  Follow instructions from your health care provider about eating or drinking restrictions.  If you know that you are going to have a cesarean delivery, do not shave your pubic area. Shaving before the procedure may increase your risk of infection.  Plan to have someone take you home from the hospital.  Ask your health care provider what steps will be taken to prevent infection. These may include: ? Removing hair at the surgery site. ? Washing skin with a germ-killing soap. ? Taking antibiotic medicine.  Depending on the reason for your cesarean delivery, you may have a physical exam or additional testing, such as an ultrasound.  You may have your blood or urine tested. Questions for your health care provider  Ask your health care provider about: ? Changing or stopping your regular medicines. This is especially important if you are taking diabetes medicines or blood  thinners. ? Your pain management plan. This is especially important if you plan to breastfeed your baby. ? How long you will be in the hospital after the procedure. ? Any concerns you may have about receiving blood products, if you need them during the procedure. ? Cord blood banking, if you plan to collect your baby's umbilical cord blood.  You may also want to ask your health care provider: ? Whether you will be able to hold or breastfeed your baby while you are still in the operating room. ? Whether your baby can stay with you immediately after the procedure and during your recovery. ? Whether a family member or a person of your choice can go with you into the operating room and stay with you during the procedure, immediately after the procedure, and during your recovery. What happens during the procedure?  An IV will be inserted into one of your veins.  Fluid and medicines, such as antibiotics, will be given before the surgery.  Fetal monitors will be placed on your abdomen to check your baby's heart rate.  You may be given a special warming gown to wear to keep your temperature stable.  A catheter may be inserted into your bladder through your urethra. This drains your urine during the procedure.  You may be given one or more of the following: ? A medicine to numb the area (local anesthetic). ? A medicine to make you fall asleep (general anesthetic). ? A medicine (regional anesthetic) that is injected into your back or through a small thin tube placed in your back (spinal anesthetic or epidural anesthetic). This   numbs everything below the injection site and allows you to stay awake during your procedure. If this makes you feel nauseous, tell your health care provider. Medicines will be available to help reduce any nausea you may feel.  An incision will be made in your abdomen, and then in your uterus.  If you are awake during your procedure, you may feel tugging and pulling in your  abdomen, but you should not feel pain. If you feel pain, tell your health care provider immediately.  Your baby will be removed from your uterus. You may feel more pressure or pushing while this happens.  Immediately after birth, your baby will be dried and kept warm. You may be able to hold and breastfeed your baby.  The umbilical cord may be clamped and cut during this time. This usually occurs after waiting a period of 1-2 minutes after delivery.  Your placenta will be removed from your uterus.  Your incisions will be closed with stitches (sutures). Staples, skin glue, or adhesive strips may also be applied to the incision in your abdomen.  Bandages (dressings) may be placed over the incision in your abdomen. The procedure may vary among health care providers and hospitals.   What happens after the procedure?  Your blood pressure, heart rate, breathing rate, and blood oxygen level will be monitored until you are discharged from the hospital.  You may continue to receive fluids and medicines through an IV.  You will have some pain. Medicines will be available to help control your pain.  To help prevent blood clots: ? You may be given medicines. ? You may have to wear compression stockings or devices. ? You will be encouraged to walk around when you are able.  Hospital staff will encourage and support bonding with your baby. Your hospital may have you and your baby to stay in the same room (rooming in) during your hospital stay to encourage successful bonding and breastfeeding.  You may be encouraged to cough and breathe deeply often. This helps to prevent lung problems.  If you have a catheter draining your urine, it will be removed as soon as possible after your procedure. Summary  Cesarean birth, or cesarean delivery, is the surgical delivery of a baby through an incision in the abdomen and the uterus.  Follow instructions from your health care provider about eating or  drinking restrictions before the procedure.  You will have some pain after the procedure. Medicines will be available to help control your pain.  Hospital staff will encourage and support bonding with your baby after the procedure. Your hospital may have you and your baby to stay in the same room (rooming in) during your hospital stay to encourage successful bonding and breastfeeding. This information is not intended to replace advice given to you by your health care provider. Make sure you discuss any questions you have with your health care provider. Document Revised: 02/27/2020 Document Reviewed: 01/04/2018 Elsevier Patient Education  2021 [employer]vier Inc[employer]   

## 2020-11-26 NOTE — Progress Notes (Signed)
   PRENATAL VISIT NOTE  Subjective:  Jodi Daniel is a 25 y.o. [id number] at [redacted]w[redacted]d being seen today for ongoing prenatal care.  She is currently monitored for the following issues for this high-risk pregnancy and has History of cesarean section followed by successful vaginal birth (VBAC); History of placenta abruption; Anxiety and depression; Headache in pregnancy, antepartum; Asthma affecting pregnancy in second trimester; Dichorionic diamniotic twin gestation; Supervision of high risk pregnancy, antepartum; History of preterm delivery, currently pregnant; BMI 30s; Obesity in pregnancy; and Unwanted fertility on their problem list.  Patient reports occasional contractions.  Contractions: Irritability. Vag. Bleeding: None,Bloody Show.   . Denies leaking of fluid.   The following portions of the patient's history were reviewed and updated as appropriate: allergies, current medications, past family history, past medical history, past social history, past surgical history and problem list.   Objective:   Vitals:   11/26/20 1127  BP: 120/83  Pulse: (!) 102  Weight: 259 lb (117.5 kg)    Fetal Status: Fetal Heart Rate (bpm): 154/164         General:  Alert, oriented and cooperative. Patient is in no acute distress.  Skin: Skin is warm and dry. No rash noted.   Cardiovascular: Normal heart rate noted  Respiratory: Normal respiratory effort, no problems with respiration noted  Abdomen: Soft, gravid, appropriate for gestational age.  Pain/Pressure: Present     Pelvic: Cervical exam deferred        Extremities: Normal range of motion.  Edema: Trace  Mental Status: Normal mood and affect. Normal behavior. Normal judgment and thought content.   Assessment and Plan:  Pregnancy: [id number] at [redacted]w[redacted]d There are no diagnoses linked to this encounter. Preterm labor symptoms and general obstetric precautions including but not limited to vaginal bleeding, contractions, leaking of fluid and fetal movement  were reviewed in detail with the patient. Please refer to After Visit Summary for other counseling recommendations.   Return in about 1 week (around 12/03/2020).  Future Appointments  Date Time Provider Department Center  12/11/2020  9:30 AM [facility] PAT 1 [facility] None  12/11/2020 10:30 AM [facility] None    Scheryl Darter, MD

## 2020-12-01 ENCOUNTER — Encounter (HOSPITAL_COMMUNITY): Payer: Self-pay | Admitting: Obstetrics & Gynecology

## 2020-12-01 ENCOUNTER — Inpatient Hospital Stay (HOSPITAL_COMMUNITY): Payer: Medicaid Other | Admitting: Anesthesiology

## 2020-12-01 ENCOUNTER — Inpatient Hospital Stay (HOSPITAL_COMMUNITY)
Admission: AD | Admit: 2020-12-01 | Discharge: 2020-12-04 | DRG: 798 | Disposition: A | Discharge status: Home / Self Care | Payer: Medicaid Other | Attending: Obstetrics and Gynecology | Admitting: Obstetrics and Gynecology

## 2020-12-01 ENCOUNTER — Other Ambulatory Visit: Payer: Self-pay

## 2020-12-01 DIAGNOSIS — O99214 Obesity complicating childbirth: Secondary | ICD-10-CM | POA: Diagnosis present

## 2020-12-01 DIAGNOSIS — Z9851 Tubal ligation status: Secondary | ICD-10-CM

## 2020-12-01 DIAGNOSIS — Z87891 Personal history of nicotine dependence: Secondary | ICD-10-CM | POA: Diagnosis not present

## 2020-12-01 DIAGNOSIS — O30043 Twin pregnancy, dichorionic/diamniotic, third trimester: Secondary | ICD-10-CM | POA: Diagnosis present

## 2020-12-01 DIAGNOSIS — K5900 Constipation, unspecified: Secondary | ICD-10-CM | POA: Diagnosis not present

## 2020-12-01 DIAGNOSIS — O30009 Twin pregnancy, unspecified number of placenta and unspecified number of amniotic sacs, unspecified trimester: Secondary | ICD-10-CM

## 2020-12-01 DIAGNOSIS — O9952 Diseases of the respiratory system complicating childbirth: Secondary | ICD-10-CM | POA: Diagnosis present

## 2020-12-01 DIAGNOSIS — O34211 Maternal care for low transverse scar from previous cesarean delivery: Secondary | ICD-10-CM | POA: Diagnosis present

## 2020-12-01 DIAGNOSIS — Z3A36 36 weeks gestation of pregnancy: Secondary | ICD-10-CM

## 2020-12-01 DIAGNOSIS — Z302 Encounter for sterilization: Secondary | ICD-10-CM | POA: Diagnosis not present

## 2020-12-01 DIAGNOSIS — Z20822 Contact with and (suspected) exposure to covid-19: Secondary | ICD-10-CM | POA: Diagnosis present

## 2020-12-01 DIAGNOSIS — F32A Depression, unspecified: Secondary | ICD-10-CM | POA: Diagnosis present

## 2020-12-01 DIAGNOSIS — F419 Anxiety disorder, unspecified: Secondary | ICD-10-CM | POA: Diagnosis present

## 2020-12-01 DIAGNOSIS — O9902 Anemia complicating childbirth: Secondary | ICD-10-CM | POA: Diagnosis present

## 2020-12-01 DIAGNOSIS — O99893 Other specified diseases and conditions complicating puerperium: Secondary | ICD-10-CM | POA: Diagnosis not present

## 2020-12-01 DIAGNOSIS — Z8759 Personal history of other complications of pregnancy, childbirth and the puerperium: Secondary | ICD-10-CM

## 2020-12-01 DIAGNOSIS — O30049 Twin pregnancy, dichorionic/diamniotic, unspecified trimester: Secondary | ICD-10-CM | POA: Diagnosis present

## 2020-12-01 DIAGNOSIS — O0990 Supervision of high risk pregnancy, unspecified, unspecified trimester: Secondary | ICD-10-CM

## 2020-12-01 DIAGNOSIS — J45909 Unspecified asthma, uncomplicated: Secondary | ICD-10-CM | POA: Diagnosis present

## 2020-12-01 DIAGNOSIS — Z98891 History of uterine scar from previous surgery: Secondary | ICD-10-CM

## 2020-12-01 DIAGNOSIS — O09899 Supervision of other high risk pregnancies, unspecified trimester: Secondary | ICD-10-CM

## 2020-12-01 LAB — TYPE AND SCREEN
ABO/RH(D): B POS
Antibody Screen: NEGATIVE

## 2020-12-01 LAB — CBC
HCT: 31.4 % — ABNORMAL LOW (ref 36.0–46.0)
Hemoglobin: 9.9 g/dL — ABNORMAL LOW (ref 12.0–15.0)
MCH: 28.5 pg (ref 26.0–34.0)
MCHC: 31.5 g/dL (ref 30.0–36.0)
MCV: 90.5 fL (ref 80.0–100.0)
Platelets: 200 10*3/uL (ref 150–400)
RBC: 3.47 MIL/uL — ABNORMAL LOW (ref 3.87–5.11)
RDW: 13.2 % (ref 11.5–15.5)
WBC: 8.6 10*3/uL (ref 4.0–10.5)
nRBC: 0 % (ref 0.0–0.2)

## 2020-12-01 LAB — RESP PANEL BY RT-PCR (FLU A&B, COVID) ARPGX2
Influenza A by PCR: NEGATIVE
Influenza B by PCR: NEGATIVE
SARS Coronavirus 2 by RT PCR: NEGATIVE

## 2020-12-01 MED ORDER — LACTATED RINGERS IV BOLUS
1000.0000 mL | Freq: Once | INTRAVENOUS | Status: AC
  Administered 2020-12-01: 1000 mL via INTRAVENOUS

## 2020-12-01 MED ORDER — FENTANYL-BUPIVACAINE-NACL 0.5-0.125-0.9 MG/250ML-% EP SOLN
12.0000 mL/h | EPIDURAL | Status: DC | PRN
  Filled 2020-12-01: qty 250

## 2020-12-01 MED ORDER — PHENYLEPHRINE 40 MCG/ML (10ML) SYRINGE FOR IV PUSH (FOR BLOOD PRESSURE SUPPORT)
80.0000 ug | PREFILLED_SYRINGE | INTRAVENOUS | Status: DC | PRN
  Administered 2020-12-01: 80 ug via INTRAVENOUS

## 2020-12-01 MED ORDER — EPHEDRINE 5 MG/ML INJ: 10.0000 mg | INTRAVENOUS | Status: DC | PRN

## 2020-12-01 MED ORDER — FENTANYL CITRATE (PF) 100 MCG/2ML IJ SOLN: 100.0000 ug | INTRAMUSCULAR | Status: DC | PRN

## 2020-12-01 MED ORDER — SODIUM CHLORIDE 0.9 % IV SOLN
5.0000 10*6.[IU] | Freq: Once | INTRAVENOUS | Status: AC
  Administered 2020-12-01: 5 10*6.[IU] via INTRAVENOUS
  Filled 2020-12-01: qty 5

## 2020-12-01 MED ORDER — LACTATED RINGERS IV SOLN
500.0000 mL | Freq: Once | INTRAVENOUS | Status: AC
  Administered 2020-12-01: 500 mL via INTRAVENOUS

## 2020-12-01 MED ORDER — ACETAMINOPHEN 325 MG PO TABS: 650.0000 mg | ORAL_TABLET | ORAL | Status: DC | PRN

## 2020-12-01 MED ORDER — FAMOTIDINE IN NACL 20-0.9 MG/50ML-% IV SOLN
20.0000 mg | Freq: Two times a day (BID) | INTRAVENOUS | Status: DC | PRN
  Administered 2020-12-01: 20 mg via INTRAVENOUS
  Filled 2020-12-01: qty 50

## 2020-12-01 MED ORDER — LACTATED RINGERS IV SOLN: INTRAVENOUS | Status: DC

## 2020-12-01 MED ORDER — PHENYLEPHRINE 40 MCG/ML (10ML) SYRINGE FOR IV PUSH (FOR BLOOD PRESSURE SUPPORT)
80.0000 ug | PREFILLED_SYRINGE | INTRAVENOUS | Status: DC | PRN
  Filled 2020-12-01: qty 10

## 2020-12-01 MED ORDER — LIDOCAINE HCL (PF) 1 % IJ SOLN: 30.0000 mL | INTRAMUSCULAR | Status: DC | PRN

## 2020-12-01 MED ORDER — OXYTOCIN BOLUS FROM INFUSION: 333.0000 mL | Freq: Once | INTRAVENOUS | Status: DC

## 2020-12-01 MED ORDER — ONDANSETRON HCL 4 MG/2ML IJ SOLN
4.0000 mg | Freq: Four times a day (QID) | INTRAMUSCULAR | Status: DC | PRN
  Administered 2020-12-01: 4 mg via INTRAVENOUS
  Filled 2020-12-01: qty 2

## 2020-12-01 MED ORDER — LACTATED RINGERS IV SOLN: 500.0000 mL | INTRAVENOUS | Status: DC | PRN

## 2020-12-01 MED ORDER — BETAMETHASONE SOD PHOS & ACET 6 (3-3) MG/ML IJ SUSP
12.0000 mg | INTRAMUSCULAR | Status: DC
  Administered 2020-12-01: 12 mg via INTRAMUSCULAR
  Filled 2020-12-01: qty 5

## 2020-12-01 MED ORDER — PENICILLIN G POT IN DEXTROSE 60000 UNIT/ML IV SOLN
3.0000 10*6.[IU] | INTRAVENOUS | Status: DC
  Administered 2020-12-01 (×2): 3 10*6.[IU] via INTRAVENOUS
  Filled 2020-12-01 (×2): qty 50

## 2020-12-01 MED ORDER — DIPHENHYDRAMINE HCL 50 MG/ML IJ SOLN: 12.5000 mg | INTRAMUSCULAR | Status: DC | PRN

## 2020-12-01 MED ORDER — OXYCODONE-ACETAMINOPHEN 5-325 MG PO TABS: 1.0000 | ORAL_TABLET | ORAL | Status: DC | PRN

## 2020-12-01 MED ORDER — OXYTOCIN-SODIUM CHLORIDE 30-0.9 UT/500ML-% IV SOLN
2.5000 [IU]/h | INTRAVENOUS | Status: DC
  Filled 2020-12-01: qty 500

## 2020-12-01 MED ORDER — SOD CITRATE-CITRIC ACID 500-334 MG/5ML PO SOLN
30.0000 mL | ORAL | Status: DC | PRN
  Administered 2020-12-01: 30 mL via ORAL
  Filled 2020-12-01: qty 15

## 2020-12-01 MED ORDER — LIDOCAINE HCL (PF) 1 % IJ SOLN
INTRAMUSCULAR | Status: DC | PRN
  Administered 2020-12-01: 10 mL via EPIDURAL
  Administered 2020-12-01: 2 mL via EPIDURAL

## 2020-12-01 MED ORDER — OXYCODONE-ACETAMINOPHEN 5-325 MG PO TABS: 2.0000 | ORAL_TABLET | ORAL | Status: DC | PRN

## 2020-12-01 MED ORDER — FENTANYL-BUPIVACAINE-NACL 0.5-0.125-0.9 MG/250ML-% EP SOLN
EPIDURAL | Status: DC | PRN
  Administered 2020-12-01: 12 mL/h via EPIDURAL

## 2020-12-01 MED ORDER — OXYTOCIN 10 UNIT/ML IJ SOLN: 10.0000 [IU] | Freq: Once | INTRAMUSCULAR | Status: DC | PRN

## 2020-12-01 NOTE — Progress Notes (Signed)
Pt presents to MAU with C/O regular ut ctx. Denies vaginal bleeding or LOF Reports FM x 2.  Bedside for position verified vertex/vertex.  Discuss mode of delivery with pt, if truly in labor. Pt initially wanted a c section with BTL. Pt has H/O c section in the past for placenta abruption, followed by succesful VBAC @ 2 yrs ago of 7 # 9 oz VFI without problems  Discussed R/B of VBAC vs RLTCS. Informed pt that I feel she is an excellent candidate for VBAC, as she has already proven success. Plus weights of these twins are @ 2 # less than the weight of her last child.  Pt desires to attempt VBAC. Will have nursing reevaluate in 1 hour to determine labor status.

## 2020-12-01 NOTE — MAU Note (Signed)
Presents with c/o lower abdominal and back pain that began at 0600 this morning.  Reports pain in lower back is constant but worsens when ctxs occur.  Denies VB or LOF.  Endorses +FM x2.

## 2020-12-01 NOTE — Anesthesia Preprocedure Evaluation (Addendum)
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 :   Pulmonary asthma , former smoker,    Pulmonary exam normal breath sounds clear to auscultation       Cardiovascular negative cardio ROS Normal cardiovascular exam Rhythm:Regular Rate:Normal     Neuro/Psych  Headaches, PSYCHIATRIC DISORDERS Anxiety Depression    GI/Hepatic negative GI ROS, Neg liver ROS,   Endo/Other  Obesity BMI 37  Renal/GU negative Renal ROS  Female GU complaint     Musculoskeletal negative musculoskeletal ROS (+)   Abdominal (+) + obese,   Peds negative pediatric ROS (+)  Hematology  (+) Blood dyscrasia, anemia , hct 31.4, plt 200   Anesthesia Other Findings   Reproductive/Obstetrics (+) Pregnancy Twins 1st delivery section, 2nd VBAC                            Anesthesia Physical Anesthesia Plan  ASA: III and emergent  Anesthesia Plan: Epidural   Post-op Pain Management:    Induction:   PONV Risk Score and Plan: 2  Airway Management Planned: Natural Airway  Additional Equipment: None  Intra-op Plan:   Post-operative Plan:   Informed Consent: I have reviewed the patients History and Physical, chart, labs and discussed the procedure including the risks, benefits and alternatives for the proposed anesthesia with the patient or authorized representative who has indicated his/her understanding and acceptance.       Plan Discussed with:   Anesthesia Plan Comments:         Anesthesia Quick Evaluation

## 2020-12-01 NOTE — Anesthesia Procedure Notes (Signed)
Epidural Patient location during procedure: OB Start time: 12/01/2020 1:23 PM End time: 12/01/2020 1:33 PM  Staffing Anesthesiologist: Lannie Fields, DO Performed: anesthesiologist   Preanesthetic Checklist Completed: patient identified, IV checked, risks and benefits discussed, monitors and equipment checked, pre-op evaluation and timeout performed  Epidural Patient position: sitting Prep: DuraPrep and site prepped and draped Patient monitoring: continuous pulse ox, blood pressure, heart rate and cardiac monitor Approach: midline Location: L3-L4 Injection technique: LOR air  Needle:  Needle type: Tuohy  Needle gauge: 17 G Needle length: 9 cm Needle insertion depth: 8 cm Catheter type: closed end flexible Catheter size: 19 Gauge Catheter at skin depth: 13 cm Test dose: negative  Assessment Sensory level: T8 Events: blood not aspirated, injection not painful, no injection resistance, no paresthesia and negative IV test  Additional Notes Patient identified. Risks/Benefits/Options discussed with patient including but not limited to bleeding, infection, nerve damage, paralysis, failed block, incomplete pain control, headache, blood pressure changes, nausea, vomiting, reactions to medication both or allergic, itching and postpartum back pain. Confirmed with bedside nurse the patient's most recent platelet count. Confirmed with patient that they are not currently taking any anticoagulation, have any bleeding history or any family history of bleeding disorders. Patient expressed understanding and wished to proceed. All questions were answered. Sterile technique was used throughout the entire procedure. Please see nursing notes for vital signs. Test dose was given through epidural catheter and negative prior to continuing to dose epidural or start infusion. Warning signs of high block given to the patient including shortness of breath, tingling/numbness in hands, complete motor  block, or any concerning symptoms with instructions to call for help. Patient was given instructions on fall risk and not to get out of bed. All questions and concerns addressed with instructions to call with any issues or inadequate analgesia.  Reason for block:procedure for pain

## 2020-12-01 NOTE — Progress Notes (Signed)
Labor Progress Note Jodi Daniel is a 25 y.o. [id number] at [redacted]w[redacted]d presented for preterm labor  S: Pt resting comfortably with epidural in place. No concerns at this time. Pt desires augmentation to progress labor.  O:  BP (!) 113/45 (BP Location: Left Arm)   Pulse 91   Temp 98.1 F (36.7 C) (Oral)   Resp 16   Ht 5\' 9"  (1.753 m)   Wt 113.4 kg   LMP 03/22/2020 (Approximate)   SpO2 100%   BMI 36.92 kg/m   Fetal Tracing:  Baby A  Baseline: 125 Variability: moderate Accels: multiple Decels: none  Baby B  Baseline: 130 Variability: moderate Accels: multiple Decels: none  Toco: irregular, difficult to trace  CVE: Dilation: 7 Effacement (%): 80 Cervical Position: Posterior Station: 0 Presentation: Vertex Exam by:: Amyrie Illingworth, MD   [name]: 25 y.o. 34 [redacted]w[redacted]d presenting in preterm labor with didi twins. Desires TOLAC. H/o Cesarean x1 secondary to placental abruption. S/p successful VBAC x1. #Preterm Labor  TOLAC: S/p BMZ x1 at 51. Notable change in cervical exam s/p epidural placement; now in active labor. AROM for clear fluid at 2200. Will continue to monitor and plan to augment with pitocin as clinically indicated. #Pain: epidural in place #FWB: Cat 1 strip x2 #GBS pending- PCN  #Contraception: desires PP BTL.  200, MD 10:23 PM
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
## 2020-12-01 NOTE — Progress Notes (Signed)
Labor Progress Note Jodi Daniel is a 25 y.o. [id number] at [redacted]w[redacted]d presented for preterm labor  S:  Patient resting comfortably with epidural  O:  BP 102/60   Pulse (!) 107   Temp 97.8 F (36.6 C) (Axillary)   Resp 18   Ht 5\' 9"  (1.753 m)   Wt 113.4 kg   LMP 03/22/2020 (Approximate)   SpO2 100%   BMI 36.92 kg/m   Fetal Tracing:  Baby A  Baseline: 125 Variability: moderate Accels: 15x15 Decels: none  Baby B  Baseline: 130 Variability: moderate Accels: 15x15 Decels: none  Toco: 10-15   CVE: Dilation: 4.5 Effacement (%): 70 Cervical Position: Posterior Station: -2 Presentation: Vertex Exam by:: [email or web address], CNM   [name]: 25 y.o. 34 110w2d Preterm labor #Labor: Cervix unchanged. Will continue to monitor. Dr. [redacted]w[redacted]d made aware of contraction pattern. BMZ ordered #Pain: epidural #FWB: Cat 1 for both babies #GBS pending- PCN  Alysia Penna, CNM 5:49 PM

## 2020-12-02 ENCOUNTER — Inpatient Hospital Stay (HOSPITAL_COMMUNITY): Payer: Medicaid Other | Admitting: Anesthesiology

## 2020-12-02 ENCOUNTER — Encounter (HOSPITAL_COMMUNITY)
Admission: AD | Disposition: A | Discharge status: Home / Self Care | Payer: Self-pay | Source: Home / Self Care | Attending: Obstetrics and Gynecology

## 2020-12-02 ENCOUNTER — Encounter (HOSPITAL_COMMUNITY): Payer: Self-pay | Admitting: Obstetrics and Gynecology

## 2020-12-02 DIAGNOSIS — Z9851 Tubal ligation status: Secondary | ICD-10-CM

## 2020-12-02 DIAGNOSIS — O34211 Maternal care for low transverse scar from previous cesarean delivery: Secondary | ICD-10-CM

## 2020-12-02 DIAGNOSIS — O30009 Twin pregnancy, unspecified number of placenta and unspecified number of amniotic sacs, unspecified trimester: Secondary | ICD-10-CM

## 2020-12-02 DIAGNOSIS — Z302 Encounter for sterilization: Secondary | ICD-10-CM

## 2020-12-02 DIAGNOSIS — O30043 Twin pregnancy, dichorionic/diamniotic, third trimester: Secondary | ICD-10-CM

## 2020-12-02 DIAGNOSIS — Z3A36 36 weeks gestation of pregnancy: Secondary | ICD-10-CM

## 2020-12-02 HISTORY — PX: TUBAL LIGATION: SHX77

## 2020-12-02 LAB — RPR: RPR Ser Ql: NONREACTIVE

## 2020-12-02 SURGERY — LIGATION, FALLOPIAN TUBE, POSTPARTUM
Anesthesia: Epidural | Laterality: Bilateral

## 2020-12-02 MED ORDER — FENTANYL CITRATE (PF) 100 MCG/2ML IJ SOLN
INTRAMUSCULAR | Status: AC
  Filled 2020-12-02: qty 2

## 2020-12-02 MED ORDER — TRANEXAMIC ACID-NACL 1000-0.7 MG/100ML-% IV SOLN
INTRAVENOUS | Status: AC
  Administered 2020-12-02: 1000 mg
  Filled 2020-12-02: qty 100

## 2020-12-02 MED ORDER — METOCLOPRAMIDE HCL 10 MG PO TABS
10.0000 mg | ORAL_TABLET | Freq: Once | ORAL | Status: AC
Start: 2020-12-02 — End: 2020-12-02
  Administered 2020-12-02: 10 mg via ORAL
  Filled 2020-12-02: qty 1

## 2020-12-02 MED ORDER — DIPHENHYDRAMINE HCL 25 MG PO CAPS: 25.0000 mg | ORAL_CAPSULE | Freq: Four times a day (QID) | ORAL | Status: DC | PRN

## 2020-12-02 MED ORDER — WITCH HAZEL-GLYCERIN EX PADS: 1.0000 "application " | MEDICATED_PAD | CUTANEOUS | Status: DC | PRN

## 2020-12-02 MED ORDER — OXYCODONE-ACETAMINOPHEN 5-325 MG PO TABS
1.0000 | ORAL_TABLET | Freq: Four times a day (QID) | ORAL | Status: DC | PRN
Start: 2020-12-02 — End: 2020-12-03
  Administered 2020-12-02 (×2): 1 via ORAL
  Administered 2020-12-03: 2 via ORAL
  Filled 2020-12-02: qty 2
  Filled 2020-12-02 (×2): qty 1

## 2020-12-02 MED ORDER — TRANEXAMIC ACID-NACL 1000-0.7 MG/100ML-% IV SOLN: 1000.0000 mg | Freq: Once | INTRAVENOUS | Status: DC

## 2020-12-02 MED ORDER — MIDAZOLAM HCL 2 MG/2ML IJ SOLN
INTRAMUSCULAR | Status: AC
  Filled 2020-12-02: qty 2

## 2020-12-02 MED ORDER — PROPOFOL 10 MG/ML IV BOLUS
INTRAVENOUS | Status: AC
  Filled 2020-12-02: qty 20

## 2020-12-02 MED ORDER — PROMETHAZINE HCL 25 MG/ML IJ SOLN: 6.2500 mg | INTRAMUSCULAR | Status: DC | PRN

## 2020-12-02 MED ORDER — SIMETHICONE 80 MG PO CHEW
80.0000 mg | CHEWABLE_TABLET | ORAL | Status: DC | PRN
  Administered 2020-12-03: 80 mg via ORAL
  Filled 2020-12-02: qty 1

## 2020-12-02 MED ORDER — ONDANSETRON HCL 4 MG/2ML IJ SOLN
INTRAMUSCULAR | Status: DC | PRN
  Administered 2020-12-02: 4 mg via INTRAVENOUS

## 2020-12-02 MED ORDER — COCONUT OIL OIL: 1.0000 "application " | TOPICAL_OIL | Status: DC | PRN

## 2020-12-02 MED ORDER — DEXAMETHASONE SODIUM PHOSPHATE 10 MG/ML IJ SOLN
INTRAMUSCULAR | Status: AC
  Filled 2020-12-02: qty 1

## 2020-12-02 MED ORDER — KETOROLAC TROMETHAMINE 30 MG/ML IJ SOLN: 30.0000 mg | Freq: Once | INTRAMUSCULAR | Status: DC

## 2020-12-02 MED ORDER — TETANUS-DIPHTH-ACELL PERTUSSIS 5-2.5-18.5 LF-MCG/0.5 IM SUSY: 0.5000 mL | PREFILLED_SYRINGE | Freq: Once | INTRAMUSCULAR | Status: DC

## 2020-12-02 MED ORDER — PRENATAL MULTIVITAMIN CH
1.0000 | ORAL_TABLET | Freq: Every day | ORAL | Status: DC
  Administered 2020-12-02 – 2020-12-04 (×3): 1 via ORAL
  Filled 2020-12-02 (×3): qty 1

## 2020-12-02 MED ORDER — BENZOCAINE-MENTHOL 20-0.5 % EX AERO
1.0000 "application " | INHALATION_SPRAY | CUTANEOUS | Status: DC | PRN
  Administered 2020-12-02: 1 via TOPICAL
  Filled 2020-12-02: qty 56

## 2020-12-02 MED ORDER — DIBUCAINE (PERIANAL) 1 % EX OINT: 1.0000 "application " | TOPICAL_OINTMENT | CUTANEOUS | Status: DC | PRN

## 2020-12-02 MED ORDER — IBUPROFEN 600 MG PO TABS
600.0000 mg | ORAL_TABLET | Freq: Four times a day (QID) | ORAL | Status: DC
  Administered 2020-12-02 – 2020-12-04 (×11): 600 mg via ORAL
  Filled 2020-12-02 (×11): qty 1

## 2020-12-02 MED ORDER — ONDANSETRON HCL 4 MG/2ML IJ SOLN: 4.0000 mg | INTRAMUSCULAR | Status: DC | PRN

## 2020-12-02 MED ORDER — ACETAMINOPHEN 325 MG PO TABS
650.0000 mg | ORAL_TABLET | Freq: Four times a day (QID) | ORAL | Status: DC
  Administered 2020-12-02 – 2020-12-04 (×10): 650 mg via ORAL
  Filled 2020-12-02 (×10): qty 2

## 2020-12-02 MED ORDER — DEXAMETHASONE SODIUM PHOSPHATE 10 MG/ML IJ SOLN
INTRAMUSCULAR | Status: DC | PRN
  Administered 2020-12-02: 10 mg via INTRAVENOUS

## 2020-12-02 MED ORDER — LIDOCAINE HCL (CARDIAC) PF 100 MG/5ML IV SOSY
PREFILLED_SYRINGE | INTRAVENOUS | Status: DC | PRN
  Administered 2020-12-02: 100 mg via INTRAVENOUS

## 2020-12-02 MED ORDER — FENTANYL CITRATE (PF) 100 MCG/2ML IJ SOLN
25.0000 ug | INTRAMUSCULAR | Status: DC | PRN
  Administered 2020-12-02 (×2): 50 ug via INTRAVENOUS

## 2020-12-02 MED ORDER — KETOROLAC TROMETHAMINE 30 MG/ML IJ SOLN
INTRAMUSCULAR | Status: AC
  Filled 2020-12-02: qty 1

## 2020-12-02 MED ORDER — BUPIVACAINE HCL (PF) 0.25 % IJ SOLN
INTRAMUSCULAR | Status: DC | PRN
  Administered 2020-12-02: 30 mL

## 2020-12-02 MED ORDER — ACETAMINOPHEN 160 MG/5ML PO SOLN: 1000.0000 mg | Freq: Once | ORAL | Status: DC

## 2020-12-02 MED ORDER — FAMOTIDINE 20 MG PO TABS
40.0000 mg | ORAL_TABLET | Freq: Once | ORAL | Status: AC
  Administered 2020-12-02: 40 mg via ORAL
  Filled 2020-12-02: qty 2

## 2020-12-02 MED ORDER — SUCCINYLCHOLINE CHLORIDE 200 MG/10ML IV SOSY
PREFILLED_SYRINGE | INTRAVENOUS | Status: AC
  Filled 2020-12-02: qty 10

## 2020-12-02 MED ORDER — SUCCINYLCHOLINE CHLORIDE 20 MG/ML IJ SOLN
INTRAMUSCULAR | Status: DC | PRN
  Administered 2020-12-02: 120 mg via INTRAVENOUS

## 2020-12-02 MED ORDER — BUPIVACAINE HCL (PF) 0.25 % IJ SOLN
INTRAMUSCULAR | Status: AC
  Filled 2020-12-02: qty 30

## 2020-12-02 MED ORDER — PROPOFOL 10 MG/ML IV BOLUS
INTRAVENOUS | Status: DC | PRN
  Administered 2020-12-02: 200 mg via INTRAVENOUS

## 2020-12-02 MED ORDER — ACETAMINOPHEN 500 MG PO TABS: 1000.0000 mg | ORAL_TABLET | Freq: Once | ORAL | Status: DC

## 2020-12-02 MED ORDER — ONDANSETRON HCL 4 MG PO TABS: 4.0000 mg | ORAL_TABLET | ORAL | Status: DC | PRN

## 2020-12-02 MED ORDER — SENNOSIDES-DOCUSATE SODIUM 8.6-50 MG PO TABS
2.0000 | ORAL_TABLET | Freq: Every day | ORAL | Status: DC
  Administered 2020-12-03 – 2020-12-04 (×2): 2 via ORAL
  Filled 2020-12-02 (×2): qty 2

## 2020-12-02 MED ORDER — FENTANYL CITRATE (PF) 100 MCG/2ML IJ SOLN
INTRAMUSCULAR | Status: DC | PRN
  Administered 2020-12-02: 100 ug via INTRAVENOUS
  Administered 2020-12-02 (×2): 50 ug via INTRAVENOUS

## 2020-12-02 MED ORDER — LACTATED RINGERS IV SOLN: INTRAVENOUS | Status: DC

## 2020-12-02 MED ORDER — LIDOCAINE 2% (20 MG/ML) 5 ML SYRINGE
INTRAMUSCULAR | Status: AC
  Filled 2020-12-02: qty 5

## 2020-12-02 MED ORDER — ONDANSETRON HCL 4 MG/2ML IJ SOLN
INTRAMUSCULAR | Status: AC
  Filled 2020-12-02: qty 2

## 2020-12-02 SURGICAL SUPPLY — 23 items
CLIP FILSHIE TUBAL LIGA STRL (Clip) ×1 IMPLANT
CLOTH BEACON ORANGE TIMEOUT ST (SAFETY) ×2 IMPLANT
DRESSING OPSITE X SMALL 2X3 (GAUZE/BANDAGES/DRESSINGS) ×1 IMPLANT
DRSG OPSITE POSTOP 3X4 (GAUZE/BANDAGES/DRESSINGS) ×2 IMPLANT
DURAPREP 26ML APPLICATOR (WOUND CARE) ×2 IMPLANT
GLOVE BIO SURGEON STRL SZ7.5 (GLOVE) ×2 IMPLANT
GLOVE BIOGEL PI IND STRL 7.0 (GLOVE) ×2 IMPLANT
GLOVE BIOGEL PI INDICATOR 7.0 (GLOVE) ×2
GOWN STRL REUS W/TWL LRG LVL3 (GOWN DISPOSABLE) ×2 IMPLANT
GOWN STRL REUS W/TWL XL LVL3 (GOWN DISPOSABLE) ×2 IMPLANT
NEEDLE HYPO 22GX1.5 SAFETY (NEEDLE) ×2 IMPLANT
NS IRRIG 1000ML POUR BTL (IV SOLUTION) ×2 IMPLANT
PACK ABDOMINAL MINOR (CUSTOM PROCEDURE TRAY) ×2 IMPLANT
PROTECTOR NERVE ULNAR (MISCELLANEOUS) ×2 IMPLANT
SPONGE LAP 4X18 RFD (DISPOSABLE) IMPLANT
SUT MNCRL AB 4-0 PS2 18 (SUTURE) ×2 IMPLANT
SUT PLAIN 0 NONE (SUTURE) ×2 IMPLANT
SUT VIC AB 0 CT1 27 (SUTURE) ×2
SUT VIC AB 0 CT1 27XBRD ANBCTR (SUTURE) ×1 IMPLANT
SYR CONTROL 10ML LL (SYRINGE) ×2 IMPLANT
TOWEL OR 17X24 6PK STRL BLUE (TOWEL DISPOSABLE) ×4 IMPLANT
TRAY FOLEY CATH SILVER 14FR (SET/KITS/TRAYS/PACK) ×2 IMPLANT
WATER STERILE IRR 1000ML POUR (IV SOLUTION) ×2 IMPLANT

## 2020-12-02 NOTE — Discharge Summary (Signed)
Postpartum Discharge Summary    Patient Name: Jodi Daniel DOB: [birth year] MRN: [id number]  Date of admission: 12/01/2020 Delivery date:   Jodi, Daniel [[id number]]  12/02/2020    Jodi, Daniel [[id number]]  12/02/2020   Delivering provider:    [name], Daniel [[id number]]  Jodi, Daniel [[id number]]  Jodi Daniel E   Date of discharge: 12/04/2020  Admitting diagnosis: Indication for care in labor or delivery [O75.9] Intrauterine pregnancy: [redacted]w[redacted]d    Secondary diagnosis:  Principal Problem:   Twin pregnancy delivered vaginally Active Problems:   History of cesarean section followed by successful vaginal birth (VBAC)   History of placenta abruption   Anxiety and depression   Asthma affecting pregnancy in second trimester   Dichorionic diamniotic twin gestation   History of preterm delivery, currently pregnant   Indication for care in labor or delivery   History of tubal ligation  Additional problems: as noted above   Discharge diagnosis: VBAC of DiDi Twins, Vacuum-assisted Vaginal Delivery of Twin B                                            Post partum procedures:postpartum tubal ligation Augmentation: AROM Complications: Fetal Terminal Bradycardia, Twin B  Daniel course: Onset of Labor With Vaginal Delivery      25y.o. yo [id number] admitted in Latent Labor on 12/01/2020. Patient had an uncomplicated labor course. AROM for clear fluid of Twin A. She then continued to advance to complete cervical dilation. Twin A delivered cephalic without complication. AROM for Twin B was then performed s/p confirmation of persistent cephalic presentation. Given terminal fetal bradycardia of Twin B, pt was consented for vacuum-assisted vaginal delivery. Twin B then delivered with assistance of vacuum; no complications noted. NICU present at time of delivery. Membrane Rupture Time/Date:    Jodi, Daniel[[id number]]10:00 PM    Jodi, [name]  ,   Jodi, Ullmer0[[id number]]12/01/2020    Jodi, Graefe0[[id number]]12/01/2020    Delivery Method:   Jodi Saas0[[id number]]Vaginal, Spontaneous    [name], [name]   Episiotomy:    Jodi, [name]    Jodi, [name]   Lacerations:     Jodi, [name]    Jodi, [name]  Patient had an uncomplicated postpartum course. Postpartum bilateral tubal performed on PPD#0 without complication. She is ambulating, tolerating a regular diet, passing flatus, and urinating well. Patient is discharged home in stable condition on 12/04/20.  Newborn Data: Birth date:   Jodi, Hoselton0[[id number]]12/02/2020    Jodi, Rosten0[[id number]]12/02/2020   Birth time:   Jodi, [name]    [name]   Gender:   Jodi, [name]    Jodi, [name]   Living status:   Jodi, [name]    Jodi, [name]   Apgars:   Jodi, [name],   [name]    Jodi, Daniel Murray Daniel [[id number]]  9   Weight:   Jodi, Daniel [[id number]]  1423 g    Jodi, Daniel [[id number]]  2750 g    Magnesium Sulfate received: No BMZ received: Yes x1 Rhophylac:N/A MMR:N/A T-DaP:Given prenatally Flu: offered prior to discharge Transfusion:No  Physical exam  Vitals:   12/03/20 1005 12/03/20 1404 12/03/20 1945 12/04/20 0500  BP: 120/74 132/78 113/60 (!) 99/52  Daniel: 69 65 75 65  Resp: 17 16 18 16   Temp: 97.7 F (36.5 C) 97.9 F (36.6  C) 98.3 F (36.8 C) 98.3 F (36.8 C)  TempSrc: Oral Oral Oral Oral  SpO2:   99%   Weight:      Height:       General: alert, cooperative and no distress Lochia: appropriate Uterine Fundus: firm Incision: Healing well with no significant drainage, No significant erythema, Dressing is clean, dry, and intact DVT Evaluation: No evidence of DVT seen on physical exam. No cords or calf tenderness. No significant calf/ankle edema. Labs: Lab Results  Component Value Date   WBC 8.6 12/01/2020   HGB 9.9 (L) 12/01/2020   HCT 31.4 (L) 12/01/2020   MCV 90.5 12/01/2020   PLT 200 12/01/2020   CMP Latest Ref Rng & Units 06/14/2020  Glucose 65 - 99 mg/dL 96  BUN 6 - 20 mg/dL 10  Creatinine 0.57 - 1.00 mg/dL 0.70  Sodium 134 - 144 mmol/L 136  Potassium 3.5 - 5.2 mmol/L 4.0  Chloride 96 - 106 mmol/L 104  CO2 20 - 29 mmol/L 18(L)  Calcium 8.7 - 10.2 mg/dL 9.1  Total Protein 6.0 - 8.5 g/dL 6.5  Total Bilirubin 0.0 - 1.2 mg/dL <0.2  Alkaline Phos 44 - 121 IU/L 57  AST 0 - 40 IU/L 11  ALT 0 - 32 IU/L 8   Edinburgh Score: Edinburgh Postnatal Depression Scale Screening Tool 12/03/2020  I have been able to laugh and see the funny side of things. 0  I have looked forward with enjoyment to things. 0  I have blamed myself unnecessarily when things went wrong. 2  I have been anxious or worried for no good reason. 2  I have felt scared or panicky for no good reason. 2  Things have been getting on top of me. 1  I have been so unhappy that I have had difficulty sleeping. 0  I have felt sad or miserable. 0  I have been so unhappy that I have been crying. 1  The thought of harming myself has occurred to me. 0  Edinburgh Postnatal Depression Scale Total 8     After visit meds:  Allergies as of 12/04/2020   No Known Allergies     Medication List    STOP taking these medications   aspirin EC 81 MG tablet   cyclobenzaprine 10 MG tablet Commonly known as: FLEXERIL   folic acid 1 MG  tablet Commonly known as: FOLVITE   sucralfate 1 GM/10ML suspension Commonly known as: Carafate     TAKE these medications   acetaminophen 500 MG tablet Commonly known as: TYLENOL Take 1,000 mg by mouth every 6 (six) hours as needed. What changed: Another medication with the same name was removed. Continue taking this medication, and follow the directions you see here.   coconut oil Oil Apply 1 application topically as needed (nipple pain).   famotidine 20 MG tablet Commonly known as: PEPCID Take 1 tablet (20 mg total) by mouth 2 (two) times daily.  ferrous sulfate 325 (65 FE) MG tablet Take 1 tablet (325 mg total) by mouth every other day. Start taking on: Dec 05, 2020   fluticasone-salmeterol 230-21 MCG/ACT inhaler Commonly known as: ADVAIR HFA Inhale 2 puffs into the lungs 2 (two) times daily.   ibuprofen 600 MG tablet Commonly known as: ADVIL Take 1 tablet (600 mg total) by mouth every 8 (eight) hours.   oxyCODONE 5 MG immediate release tablet Commonly known as: Oxy IR/ROXICODONE Take 1 tablet (5 mg total) by mouth every 6 (six) hours as needed for up to 3 days for severe pain or breakthrough pain.   polyethylene glycol 17 g packet Commonly known as: MIRALAX / GLYCOLAX Take 17 g by mouth daily as needed for mild constipation or moderate constipation.        Discharge home in stable condition Infant Feeding: breast & formula Infant Disposition:home with mother Discharge instruction: per After Visit Summary and Postpartum booklet. Activity: Advance as tolerated. Pelvic rest for 6 weeks.  Diet: routine diet Future Appointments: Future Appointments  Date Time Provider [place]  12/11/2020  9:30 AM MC-LD PAT 1 MC-INDC None  12/11/2020 10:30 AM MC-SCREENING MC-SDSC None   Follow up Visit: Message sent to [facility] by Dr. Astrid Drafts.  Please schedule this patient for a In person postpartum visit in 6 weeks with the following provider: Any provider. Additional  Postpartum F/U:none  High risk pregnancy complicated by: DiDi Twins, h/o preterm labor, h/o CS for abruption Delivery mode:     Allisa, [place] [[id number]]  Vaginal, Spontaneous    Anselma, Herbel [[id number]]  Vaginal, Vacuum Neurosurgeon)   Anticipated Birth Control:  BTL done PP  Jariana Shumard, Gildardo Cranker, MD OB Fellow, Faculty Practice 12/04/2020 10:12 AM

## 2020-12-02 NOTE — Op Note (Signed)
Thelia Tanksley 12/02/2020  PREOPERATIVE DIAGNOSES: Multiparity, undesired fertility  POSTOPERATIVE DIAGNOSES: Multiparity, undesired fertility  PROCEDURE:  Postpartum Bilateral Tubal Sterilization via Pomeroy method   SURGEON: Dr.  Nettie Elm  ANESTHESIA:  GETA and local analgesia using 30 ml of 0.25% Marcaine  COMPLICATIONS:  None immediate.  ESTIMATED BLOOD LOSS: < 10 cc  FLUIDS: As recorded  URINE OUTPUT:  As recorded  INDICATIONS:  25 y.o. [id number] with undesired fertility,status post vaginal delivery, desires permanent sterilization.  Other reversible forms of contraception were discussed with patient; she declines all other modalities. Risks of procedure discussed with patient including but not limited to: risk of regret, permanence of method, bleeding, infection, injury to surrounding organs and need for additional procedures.  Failure risk of 1 -2 % with increased risk of ectopic gestation if pregnancy occurs was also discussed with patient.      FINDINGS:  Normal uterus, tubes, and ovaries.  PROCEDURE DETAILS: The patient was taken to the operating room where her epidural anesthesia was dosed up to surgical level and found to be adequate.  She was then placed in the dorsal supine position and prepped and draped in sterile fashion.  After an adequate timeout was performed, attention was turned to the patient's abdomen where a small transverse skin incision was made under the umbilical fold. The incision was taken down to the layer of fascia using the scalpel, and fascia was incised, and extended bilaterally using Mayo scissors. The peritoneum was entered in a sharp fashion. Attention was then turned to the patient's uterus, and left fallopian tube was identified and followed out to the fimbriated end.  A portion of the tube was double ligated with plain gut suture and the ensuing knuckle was excised. Two ostia were noted.  A similar process was carried out on the right side  allowing for bilateral tubal sterilization.  Good hemostasis was noted overall. The instruments were then removed from the patient's abdomen and the fascial incision was repaired with 0 Vicryl, and the skin was closed with a 4-0 Vicryl subcuticular stitch. The patient tolerated the procedure well.  Instrument, sponge, and needle counts were correct times two. The patient was then taken to the recovery room awake and in stable condition.   Nettie Elm, MD, FACOG Attending Obstetrician & Gynecologist Faculty Practice, [facility]

## 2020-12-02 NOTE — Lactation Note (Addendum)
This note was copied from a baby's chart. Lactation Consultation Note  Patient Name: Jodi Daniel BSJGG'E Date: 12/02/2020   Age:27 hours  Mom states infant fed formula 1 hr prior to visit. Mom resting from tubal ligation. LC went ahead and set up her DEBP and reviewed feeding of LPTI based on guidelines. Mom aware total feeding under 30 minutes to reduce calorie loss.   Plan 1. To feed based on cues 8-12x in 24 hr period no more than 3 hrs without an attempt. Mom to offer breasts first followed by supplementing with EBM or formula.             2. Breastfeeding supplementation guide for LPTI reviewed.             3. Mom to pump on DEBP q 3hrs for .  All questions answered at the end of the visit.  Mom to call for assistance with latching for next feeding.   LC returned to work on breastfeeding. Mom had concerns about medication did not want to latch until medication reviewed. LC checked with RN, Lennette Bihari, she is only on one pain medication (percocet). Mom to work on breastfeeding overnight and increasing volumes.  Maternal Data    Feeding Nipple Type: Slow - flow  LATCH Score                    Lactation Tools Discussed/Used    Interventions    Discharge    Consult Status      Harbor Paster  [name] 12/02/2020, 6:07 PM

## 2020-12-02 NOTE — Lactation Note (Signed)
This note was copied from a baby's chart. Lactation Consultation Note  Patient Name: Jodi Daniel NGEXB'M Date: 12/02/2020   Age:27 hours  LC went in to assist Mom with breastfeeding. Mom is on her way for a tubal ligation. LC to follow up with Mother later.   Maternal Data    Feeding Nipple Type: Slow - flow  LATCH Score                    Lactation Tools Discussed/Used    Interventions    Discharge    Consult Status      [name]  [name] 12/02/2020, 2:30 PM

## 2020-12-02 NOTE — Anesthesia Postprocedure Evaluation (Signed)
Anesthesia Post Note  Patient: Jodi Daniel  Procedure(s) Performed: POST PARTUM TUBAL LIGATION (Bilateral )     Patient location during evaluation: PACU Anesthesia Type: General Level of consciousness: awake and alert and oriented Pain management: pain level controlled Vital Signs Assessment: post-procedure vital signs reviewed and stable Respiratory status: spontaneous breathing, nonlabored ventilation and respiratory function stable Cardiovascular status: blood pressure returned to baseline Postop Assessment: no apparent nausea or vomiting Anesthetic complications: no   No complications documented.             Kaylyn Layer

## 2020-12-02 NOTE — Transfer of Care (Signed)
Immediate Anesthesia Transfer of Care Note  Patient: Jodi Daniel  Procedure(s) Performed: POST PARTUM TUBAL LIGATION (Bilateral )  Patient Location: PACU  Anesthesia Type:General  Level of Consciousness: awake, alert  and oriented  Airway & Oxygen Therapy: Patient Spontanous Breathing and Patient connected to nasal cannula oxygen  Post-op Assessment: Report given to RN and Post -op Vital signs reviewed and stable  Post vital signs: Reviewed and stable  Last Vitals:  Vitals Value Taken Time  BP 116/85 12/02/20 1547  Temp    Pulse 86 12/02/20 1552  Resp 16 12/02/20 1552  SpO2 100 % 12/02/20 1552  Vitals shown include unvalidated device data.  Last Pain:  Vitals:   12/02/20 1329  TempSrc:   PainSc: 6       Patients Stated Pain Goal: 2 (12/02/20 1329)  Complications: No complications documented.

## 2020-12-02 NOTE — Plan of Care (Signed)
  Problem: Education: Goal: Knowledge of condition will improve Outcome: Completed/Met

## 2020-12-02 NOTE — Lactation Note (Signed)
This note was copied from a baby's chart. Lactation Consultation Note  Patient Name: Jodi Daniel ZDGLO'V Date: 12/02/2020 Reason for consult: Follow-up assessment;Late-preterm 34-36.6wks;Infant < 6lbs Age:27 hours   Mom states infant fed formula 1 hr prior to visit. Mom resting from tubal ligation. LC went ahead and set up her DEBP and reviewed feeding of LPTI based on guidelines. Mom aware total feeding under 30 minutes to reduce calorie loss.   Plan 1. To feed based on cues 8-12x in 24 hr period no more than 3 hrs without an attempt. Mom to offer breasts first followed by supplementing with EBM or formula.             2. Breastfeeding supplementation guide for LPTI reviewed.             3. Mom to pump on DEBP q 3hrs for 15 minutes.  All questions answered at the end of the visit.  Mom to call for assistance with latching for next feeding.   Maternal Data Has patient been taught Hand Expression?: Yes Does the patient have breastfeeding experience prior to this delivery?: Yes How long did the patient breastfeed?: 3 months and 6 months  Feeding Mother's Current Feeding Choice: Breast Milk and Formula Nipple Type: Slow - flow  LATCH Score                    Lactation Tools Discussed/Used Tools: Pump;Flanges Flange Size: 24 Breast pump type: Double-Electric Breast Pump Pump Education: Setup, frequency, and cleaning;Milk Storage Reason for Pumping: increase stimulation Pumping frequency: every 3 hrs for  Interventions Interventions: Breast feeding basics reviewed;DEBP;Skin to skin;Position options;Education;Breast massage;Expressed milk;Hand express;Breast compression  Discharge Pump: Manual WIC Program: Yes  Consult Status Consult Status: Follow-up Date: 12/03/20 Follow-up type: In-patient    Jodi Daniel  Jodi Daniel 12/02/2020, 6:00 PM

## 2020-12-02 NOTE — Lactation Note (Signed)
This note was copied from a baby's chart. Lactation Consultation Note  Patient Name: Jodi Daniel XMIWO'E Date: 12/02/2020 Reason for consult: Initial assessment;Late-preterm 34-36.6wks;Infant < 6lbs;Multiple gestation;Maternal endocrine disorder;Other (Comment) (Baby A - cold - per mom fed the baby 5 ml of formula , baby asleep STS with mom and warm blankets on top. 2nd temp per RN ok. LC reviewed and provided the LPT hand out and discussed theie potential feeding behaviors.) Age:27 hours  Maternal Data Does the patient have breastfeeding experience prior to this delivery?: Yes How long did the patient breastfeed?: per mom 1st baby 6 months and 2nd baby 3 months and milk came in well  Feeding Mother's Current Feeding Choice: Breast Milk and Formula Nipple Type: Slow - flow  LATCH Score- to sleepy to latch                     Lactation Tools Discussed/Used Tools: Pump;Flanges Flange Size: 24;Other (comment) (LC observed the the pumping and the #24 F is a good fit) Breast pump type: Double-Electric Breast Pump Pump Education: Setup, frequency, and cleaning;Milk Storage Reason for Pumping: LPT twins , Less than 6 pounds  Interventions Interventions: Breast feeding basics reviewed;DEBP;Education  Discharge Pump: Personal;DEBP WIC Program: Yes  Consult Status Consult Status: Follow-up Date: 12/02/20 Follow-up type: In-patient    Jodi Daniel 12/02/2020, 9:31 AM

## 2020-12-02 NOTE — Progress Notes (Signed)
Patient ID: Jodi Daniel, female   DOB: Aug 14, 1995, 25 y.o.   MRN: [id number]   Patient desires bilateral tubal sterilization.  Other reversible forms of contraception were discussed with patient; she declines all other modalities. Discussed bilateral tubal sterilization in detail; discussed options of postpartum bilateral tubal sterilization using Filshie clips, Pomeroy method  vs  bilateral salpingectomy. Risks and benefits discussed in detail including but not limited to: risk of regret, permanence of method, bleeding, infection, injury to surrounding organs and need for additional procedures.  Failure risk of 1-2 % for Filshie clips and <1% for bilateral salpingectomy with increased risk of ectopic gestation if pregnancy occurs was also discussed with patient.  Also discussed possible reduction of risk of ovarian cancer via bilateral salpingectomy given that a growing body of knowledge reveals that the majority of cases of high grade serous "ovarian" cancer actually are actually  cancers arising from the fimbriated end of the fallopian tubes. Emphasized that removal of fallopian tubes do not result in any known hormonal imbalance.  Patient verbalized understanding of these risks and benefits and wants to proceed with sterilization via Pomeroy method.     Medicaid papers have been signed , patient understands that surgery will be scheduled at least 30 days after the day papers are signed as per Medicaid guidelines.

## 2020-12-02 NOTE — Lactation Note (Signed)
This note was copied from a baby's chart. Lactation Consultation Note  Patient Name: Jodi Daniel OEHOZ'Y Date: 12/02/2020 Reason for consult: Initial assessment;Multiple gestation;Late-preterm 34-36.6wks;Infant < 6lbs;Maternal endocrine disorder Age:27 hours  Baby B - due to feed and mom requested a bottle due to Baby A STS ( low temp)  Dad fed the baby 7 ml./ baby tolerated well.    Maternal Data    Feeding Mother's Current Feeding Choice: Breast Milk and Formula Nipple Type: Slow - flow  LATCH Score                    Lactation Tools Discussed/Used Tools: Pump Breast pump type: Double-Electric Breast Pump Reason for Pumping: Twins , LPT , less than 6 pounds  Interventions Interventions: Breast feeding basics reviewed;DEBP;Education  Discharge Pump: Personal;DEBP WIC Program: Yes  Consult Status Consult Status: Follow-up Date: 12/02/20 Follow-up type: In-patient    Jodi Daniel 12/02/2020, 9:13 AM

## 2020-12-02 NOTE — Discharge Instructions (Signed)

## 2020-12-02 NOTE — Plan of Care (Signed)
  Problem: Education: Goal: Knowledge of condition will improve Outcome: Completed/Met   Problem: Education: Goal: Knowledge of condition will improve Outcome: Completed/Met

## 2020-12-02 NOTE — Anesthesia Procedure Notes (Signed)
Procedure Name: Intubation Date/Time: 12/02/2020 2:58 PM Performed by: Rica Records, CRNA Pre-anesthesia Checklist: Patient identified, Emergency Drugs available, Suction available and Patient being monitored Patient Re-evaluated:Patient Re-evaluated prior to induction Oxygen Delivery Method: Circle system utilized Preoxygenation: Pre-oxygenation with 100% oxygen Induction Type: IV induction Ventilation: Mask ventilation without difficulty Laryngoscope Size: Miller and 2 Grade View: Grade II Laser Tube: Cuffed inflated with minimal occlusive pressure - saline Tube size: 7.0 mm Number of attempts: 1 Airway Equipment and Method: Stylet Placement Confirmation: ETT inserted through vocal cords under direct vision,  positive ETCO2 and breath sounds checked- equal and bilateral Secured at: 21 cm Tube secured with: Tape Dental Injury: Teeth and Oropharynx as per pre-operative assessment

## 2020-12-02 NOTE — Anesthesia Preprocedure Evaluation (Addendum)
Anesthesia Evaluation  Patient identified by MRN, date of birth, ID band Patient awake    Reviewed: Allergy & Precautions, NPO status , Patient's Chart, lab work & pertinent test results  History of Anesthesia Complications Negative for: history of anesthetic complications  Airway Mallampati: II  TM Distance: >3 FB Neck ROM: Full    Dental no notable dental hx.    Pulmonary asthma , former smoker,    Pulmonary exam normal        Cardiovascular negative cardio ROS Normal cardiovascular exam     Neuro/Psych  Headaches, Anxiety Depression    GI/Hepatic negative GI ROS, Neg liver ROS,   Endo/Other  negative endocrine ROS  Renal/GU negative Renal ROS  negative genitourinary   Musculoskeletal negative musculoskeletal ROS (+)   Abdominal   Peds  Hematology  (+) anemia , Hgb 9.9   Anesthesia Other Findings Day of surgery medications reviewed with patient.  Reproductive/Obstetrics PPD#0                            Anesthesia Physical Anesthesia Plan  ASA: III  Anesthesia Plan: General   Post-op Pain Management:    Induction: Intravenous  PONV Risk Score and Plan: 4 or greater and Treatment may vary due to age or medical condition, Ondansetron and Dexamethasone  Airway Management Planned: Oral ETT  Additional Equipment: None  Intra-op Plan:   Post-operative Plan: Extubation in OR  Informed Consent: I have reviewed the patients History and Physical, chart, labs and discussed the procedure including the risks, benefits and alternatives for the proposed anesthesia with the patient or authorized representative who has indicated his/her understanding and acceptance.     Dental advisory given  Plan Discussed with: CRNA  Anesthesia Plan Comments: (Patient does not want to attempt to use epidural for anesthesia as she prefers to have no residual numbness after procedure. Discussed R/B/A  of neuraxial vs GA and patient elects to have GETA for surgery. Stephannie Peters, MD)       Anesthesia Quick Evaluation

## 2020-12-02 NOTE — Progress Notes (Signed)
Patient has questions regarding other forms of birth control other than tubal ligation. Patient also verbalizes some concern that her knee is still numb from epidural and the fact that the epidural will be dosed again for tubal and that she will be numb for even longer. Patient is able to walk with assistance; however, she expresses concern. Called Dr. Myriam Jacobson requesting for an MD to speak with patient prior to procedure. Earl Gala, Linda Hedges Stewart

## 2020-12-03 ENCOUNTER — Encounter: Payer: Medicaid Other | Admitting: Obstetrics and Gynecology

## 2020-12-03 ENCOUNTER — Telehealth (HOSPITAL_COMMUNITY): Payer: Self-pay | Admitting: *Deleted

## 2020-12-03 MED ORDER — FERROUS SULFATE 325 (65 FE) MG PO TABS
325.0000 mg | ORAL_TABLET | ORAL | Status: DC
  Administered 2020-12-03: 325 mg via ORAL
  Filled 2020-12-03: qty 1

## 2020-12-03 MED ORDER — MENTHOL 3 MG MT LOZG
1.0000 | LOZENGE | OROMUCOSAL | Status: DC | PRN
  Administered 2020-12-03 – 2020-12-04 (×2): 3 mg via ORAL
  Filled 2020-12-03 (×2): qty 9

## 2020-12-03 MED ORDER — OXYCODONE HCL 5 MG PO TABS
5.0000 mg | ORAL_TABLET | ORAL | Status: DC | PRN
  Administered 2020-12-03: 10 mg via ORAL
  Administered 2020-12-03: 5 mg via ORAL
  Administered 2020-12-03: 10 mg via ORAL
  Filled 2020-12-03: qty 1
  Filled 2020-12-03 (×2): qty 2

## 2020-12-03 NOTE — Lactation Note (Signed)
This note was copied from a baby's chart. Lactation Consultation Note Checked in on mom to see how she is doing. Mom stated she has been having a lot of pain. Discussed normal to cramp when BF or pumping. Mom has pumped tonight. Encouraged to call for latch assistance or questions.  Patient Name: Jodi Daniel [id number]'[id number] Date: 12/03/2020   Age:27 hours  Maternal Data    Feeding    LATCH Score                    Lactation Tools Discussed/Used    Interventions    Discharge    Consult Status      Charyl Dancer 12/03/2020, 4:10 AM

## 2020-12-03 NOTE — Social Work (Addendum)
CSW received consult for hx of Anxiety and Depression.  CSW met with MOB to offer support and complete assessment.     CSW introduced self and role. CSW observed MOB performing skin to skin with infant 'Marcellus' while infant 'Marcella' slept in bassinet. CSW informed MOB of reason for consult and assessed current emotions. MOB was pleasant and welcoming to CSW. MOB reported she is currently doing fine, however the pregnancy was challenging both physically and emotionally. MOB disclosed she became a little anxious and depressed towards the end of the pregnancy. MOB shared she enjoys doing things with her children and she was unable to do a lot physically due to the pregnancy. MOB stated "I have been good since having the twins." MOB reported she was diagnosed with anxiety and depression in her teens. MOB stated she was on medications during her teenage years and last attended therapy in 2019 while she was pregnant with her 1yo daughter (MaiLey Bakula 12/24/18). MOB shared it was helpful and she has not felt the need for therapy since that time. MOB reported she has good coping mechanisms, which consists of doing a lot of things with her children. MOB shared she believes in breaking "generations curses" by dong activities and taking trips with her children. MOB reported spending time as a family is helpful. MOB disclosed she is only experiencing anxiety now due to 'Marcellus' needing to have an ultrasound on his kidney. MOB stated she will feel better once the ultrasound is complete. MOB laughed as she shared this is her first son, so she thinks about how she will take care of his circumcision. MOB reported she's expressed her thoughts to family and they have come up with a plan to assists MOB with her concerns. MOB stated "their father is also very reassuring." MOB identified her MOB and FOB as primary supports. MOB denies any current SI, HI or being involved in DV.   CSW provided education regarding the baby  blues period versus PPD and provided resources. MOB reported she experienced PPD following the birth of her daughter in 2020. MOB stated she had a lot of weight gain following the pregnancy, which impacted her self-esteem. MOB disclosed her child's father tried to be helpful, but it was difficult to remain positive. MOB expressed she also stopped her self-care. MOB reported the PPD did not surpass until she lost the weight in June of 2021. MOB stated she feels more comfortable seeking professional treatment if postpartum develops, considering therapy can be completed in-person now with the passing of Covid.  CSW provided the New Mom Checklist and encouraged MOB to self evaluate and contact a medical professional if symptoms are noted at any time.  CSW provided review of Sudden Infant Death Syndrome (SIDS) precautions. MOB reported she has all essentials for infant, including bassinets and car seats. MOB identified [facility] for follow-up care and denies any barriers to care. MOB reported she has both WIC and food stamp resources. MOB stated she has no additional needs at this time. MOB was appropriate while interacting with infant and did not display any acute mental health symptoms.  CSW identifies no further need for intervention and no barriers to discharge at this time.  Joshwa Hemric, LCSWA Clinical Social Work [facility] [phone]  

## 2020-12-03 NOTE — Progress Notes (Signed)
Post Partum Day 1 following VBAC of Twin A and VAVD of Twin B Subjective: up ad lib, voiding, tolerating PO, + flatus and mild constipation  BTL performed, patient states moderate pain Taking stool softener, narcotic pain medication and ibuprofen  Breastfeeding is going well, supplementing with formula also   Objective: Blood pressure 120/74, pulse 69, temperature 97.7 F (36.5 C), temperature source Oral, resp. rate 17, height 5\' 9"  (1.753 m), weight 113.4 kg, last menstrual period 03/22/2020, SpO2 100 %, unknown if currently breastfeeding.  Physical Exam:  General: alert and cooperative Lochia: appropriate Uterine Fundus: firm Incision: healing well, no significant drainage, no significant erythema DVT Evaluation: No evidence of DVT seen on physical exam. No cords or calf tenderness. No significant calf/ankle edema.  Recent Labs    12/01/20 1152  HGB 9.9*  HCT 31.4*    Assessment/Plan: Breastfeeding - going well, lactation in the room at time of rounding providing support  Normotensive  Planning circumcision BTL complete Planning for discharge 1-2 days   LOS: 2 days   12/03/20 12/03/2020, 12:36 PM

## 2020-12-03 NOTE — Progress Notes (Addendum)
POSTPARTUM PROGRESS NOTE  Post Partum Day 1  Subjective:  Jodi Daniel is a 25 y.o. [id number] s/p VBAC (twin A) and VAVD (twin B) at [redacted]w[redacted]d.  She reports she is doing well. No acute events overnight. She denies any problems with ambulating, voiding, or po intake. Denies nausea or vomiting.  Pain is moderately controlled.  Lochia is appropriate.  Objective: Blood pressure 104/65, pulse 70, temperature 98 F (36.7 C), temperature source Oral, resp. rate 18, height 5\' 9"  (1.753 m), weight 113.4 kg, last menstrual period 03/22/2020, SpO2 100 %, unknown if currently breastfeeding.  Physical Exam:  General: alert, cooperative and no distress Chest: normal work of breathing Heart:regular rate, distal pulses intact Abdomen: soft, nontender,  Uterine Fundus: firm, appropriately tender DVT Evaluation: No calf swelling or tenderness Skin: warm, dry  Recent Labs    12/01/20 1152  HGB 9.9*  HCT 31.4*    Assessment/Plan: Jodi Daniel is a 25 y.o. 34 s/p VBAC and VAVD at [redacted]w[redacted]d   #PPD#1 - Doing well  Routine postpartum care #HxPPD - SW consult #Anemia - Start po iron  #Contraception: BTL, performed 5/22 #Feeding: Breast and formula #Dispo: Plan for discharge tomorrow, 5/24   LOS: 2 days

## 2020-12-03 NOTE — Lactation Note (Signed)
This note was copied from a baby's chart. Lactation Consultation Note  Patient Name: Jodi Daniel [id number] Date: 12/03/2020 Reason for consult: Follow-up assessment;Difficult latch;Multiple gestation;Infant < 6lbs;Late-preterm 34-36.6wks Age:27 hours   P4 mother whose infant twins are now 11 hours old.  These are late preterm infants at 36+3 weeks with a CGA of 36+4 weeks.  Both babies are now < 6 lbs.  Mother's feeding preference is breast/formula.  Mother changing baby girl "A" when I arrived.  She had just formula fed baby boy "B" 40 mls of Similac Neosure 22 calorie formula at 1115.  Mother has not been latching babies to the breast.  Offered to assist and mother stated "My nipples are too big."  Observed nipples and do not believe they are too large for latching; conveyed this to mother and she was willing to try latching.  Awakened baby further and latched easily, however, baby unwilling to suck.  She appeared very sleepy and mother had only formula fed 4 mls with the earlier feed.  Offered to feed baby while mother pumps with the DEBP.  Father at work currently and mother is alone.  Reviewed pump with mother and she pumped while I formula fed 28 mls of Similac Neosure 22 calorie formula to baby.  Both babies had been using the gold nipples.  I changed them to the purple extra slow flow.  Mother reported that baby boy "B" was gulping with the gold nipple.  Asked her to alert the RN if she has any difficulty feeding either child at any time.  Mother verbalized understanding.    Mother has decided to only practice breast feeding after she gets home.  It is "too much" to do here without help from the father who works two jobs.  Reminded her that she will need to pump every three hours to help ensure a good milk supply.  Mother has not had any supply issues in the past.  Mother is a [facility] participant but plans to accept the formula package from [facility].  She has her own DEBP.  RN  updated.   Maternal Data Has patient been taught Hand Expression?: Yes Does the patient have breastfeeding experience prior to this delivery?: Yes How long did the patient breastfeed?: 6 months with first child and 3 months with second child  Feeding Mother's Current Feeding Choice: Breast Milk and Formula (LPTI) Nipple Type: Slow - flow  LATCH Score                    Lactation Tools Discussed/Used Tools: Pump;Flanges Flange Size: 24;27 Breast pump type: Double-Electric Breast Pump;Manual;Other (comment) Pump Education: Setup, frequency, and cleaning;Milk Storage Reason for Pumping: Breast stimulation; supplementation for LPTI Pumping frequency: Every three hours  Interventions    Discharge Pump: DEBP;Manual;Personal WIC Program: Yes  Consult Status Consult Status: Follow-up Date: 12/04/20 Follow-up type: In-patient    Jodi Daniel 12/03/2020, 1:10 PM

## 2020-12-03 NOTE — Addendum Note (Signed)
Addendum  created 12/03/20 1504 by Shanon Payor, CRNA   Clinical Note Signed

## 2020-12-03 NOTE — Anesthesia Postprocedure Evaluation (Signed)
Anesthesia Post Note  Patient: Jodi Daniel  Procedure(s) Performed: POST PARTUM TUBAL LIGATION (Bilateral )     Patient location during evaluation: Mother Baby Anesthesia Type: Epidural Level of consciousness: awake and alert and oriented Pain management: satisfactory to patient Vital Signs Assessment: post-procedure vital signs reviewed and stable Respiratory status: spontaneous breathing and nonlabored ventilation Cardiovascular status: stable Postop Assessment: no headache, no backache, no signs of nausea or vomiting, adequate PO intake, patient able to bend at knees and able to ambulate (patient up walking) Anesthetic complications: no   No complications documented.  Last Vitals:  Vitals:   12/03/20 1005 12/03/20 1404  BP: 120/74 132/78  Pulse: 69 65  Resp: 17 16  Temp: 36.5 C 36.6 C  SpO2:      Last Pain:  Vitals:   12/03/20 1404  TempSrc: Oral  PainSc: 3    Pain Goal: Patients Stated Pain Goal: 3 (12/02/20 1902)                 Madison Hickman

## 2020-12-04 LAB — CULTURE, BETA STREP (GROUP B ONLY)

## 2020-12-04 MED ORDER — POLYETHYLENE GLYCOL 3350 17 G PO PACK
17.0000 g | PACK | Freq: Every day | ORAL | Status: DC
  Administered 2020-12-04: 17 g via ORAL
  Filled 2020-12-04: qty 1

## 2020-12-04 MED ORDER — COCONUT OIL OIL
1.0000 | TOPICAL_OIL | 0 refills | Status: AC | PRN
Start: 2020-12-04 — End: ?

## 2020-12-04 MED ORDER — FERROUS SULFATE 325 (65 FE) MG PO TABS: 325.0000 mg | ORAL_TABLET | ORAL | 2 refills | Status: AC

## 2020-12-04 MED ORDER — OXYCODONE HCL 5 MG PO TABS: 5.0000 mg | ORAL_TABLET | Freq: Four times a day (QID) | ORAL | 0 refills | Status: AC | PRN

## 2020-12-04 MED ORDER — POLYETHYLENE GLYCOL 3350 17 G PO PACK: 17.0000 g | PACK | Freq: Every day | ORAL | 0 refills | Status: AC | PRN

## 2020-12-04 MED ORDER — IBUPROFEN 600 MG PO TABS
600.0000 mg | ORAL_TABLET | Freq: Three times a day (TID) | ORAL | 0 refills | Status: AC
Start: 2020-12-04 — End: ?

## 2020-12-04 NOTE — Lactation Note (Signed)
This note was copied from Jodi baby's chart. Lactation Consultation Note  Patient Name: Jodi Daniel [id number] Date: 12/04/2020 Reason for consult: Follow-up assessment;Late-preterm 34-36.6wks;Maternal endocrine disorder Age:27 hours  Follow up visit to 64 hours old LPT twins of P3 mother. Mother reports breast changes, pumping and feeding EBM. Infant Jodi: showing hunger cues, attempted latch but unsuccessful. Mother bottlefed ~50mL of EBM Infant B: just came back from circumcision and seems very sleepy. .  Reviewed newborn behavior, feeding patterns and expectations with mother. LC reinforced "caring for LPTI" green sheet.  Plan:   1-Follow LPTI green sheet recommendations and supplementation volume. 2-Preserve infant energy limiting feeding sessions to 30 min max.  3-Offer breast prior to supplementation 4-Pump using initiation setting every 3 hours.  5-Contact LC as needed for feeds/support/concerns/questions   Feeding Mother's Current Feeding Choice: Breast Milk and Formula Nipple Type: Extra Slow Flow  LATCH Score Latch: Repeated attempts needed to sustain latch, nipple held in mouth throughout feeding, stimulation needed to elicit sucking reflex.  Audible Swallowing: None  Type of Nipple: Everted at rest and after stimulation  Comfort (Breast/Nipple): Soft / non-tender (R-breast seems engorged and some edema in areola. LC provided ice with Jodi barrier to be used for , massage and pump using 42mm flange or 11mm, if needed.)  Hold (Positioning): Assistance needed to correctly position infant at breast and maintain latch.  LATCH Score: 6   Lactation Tools Discussed/Used Tools: Pump;Flanges;Bottle Flange Size: 30;27 (Right breast seems engorged and mother verbalizes discomfort with pumping. Fitted for 67mm, as needed mother can upgrade to 13mm. LC left them in room.) Breast pump type: Double-Electric Breast Pump;Manual;Other (comment) (personal) Pump Education:  Milk Storage;Setup, frequency, and cleaning Reason for Pumping: stimulation and supplementation Pumping frequency: Q3 Pumped volume: 50 mL (R-32mL; L-41mL)  Interventions Interventions: Breast feeding basics reviewed;Assisted with latch;Skin to skin;Breast massage;Hand express;Adjust position;Reverse pressure;DEBP;Ice;Expressed milk;Position options;Support pillows;Education  Discharge Pump: Personal;Manual;DEBP WIC Program: Yes  Consult Status Consult Status: Follow-up Date: 12/05/20 Follow-up type: In-patient    Jodi Daniel Jodi Daniel 12/04/2020, 3:20 PM

## 2020-12-05 ENCOUNTER — Ambulatory Visit: Payer: Self-pay

## 2020-12-05 LAB — SURGICAL PATHOLOGY

## 2020-12-05 NOTE — Lactation Note (Signed)
This note was copied from a baby's chart. Lactation Consultation Note  Patient Name: Jodi Daniel [id number] Date: 12/05/2020 Reason for consult: Follow-up assessment;Late-preterm 34-36.6wks;Maternal endocrine disorder;Infant < 6lbs;Multiple gestation;Infant weight loss Age:27 days  Visited with mom of 25 hours old LPI twins, she's a P3 and mainly pumping & supplementing with EBM/formula. She's also been working with SLP for bottle feedings.  Baby Jodi  She's < 6 lbs but has been the only baby that has gone to breast during hospital stay. LATCH scores of 6 due to lack of audible swallows. Mom reports she'd like to work in BF later on once they're a bit older (closer to term age) but for now she's going to stick to pumping/supplementing. She's taking about 30-50 ml/feeding and she's at 4% weight loss.  Baby Boy  He's > 6 lbs but no documented LATCH scores during hospital stay, per mom, baby hasn't been able to latch at the breast; but she has been doing STS with baby. Both babies have been on EMB/Similac 24 calorie formula and mom has been given instructions on how to fortify to 24 calorie EBM/formula. He's taking about 30 ml per feeding, sometimes he'll take more; especially at night; he's at 6% weight loss.  Mom  Reviewed discharge education, pumping schedule, size of baby's stomach and LPI policy. Encouraged mom to continue pumping every 3 hours to protect her supply. No support person in mom's room at the time of [facility] consultation. Mom reported all questions and concerns were answered, she's aware of LC OP services and will call PRN.  Maternal Data    Feeding Mother's Current Feeding Choice: Breast Milk and Formula   Lactation Tools Discussed/Used Tools: Pump Flange Size: 27;30 Breast pump type: Double-Electric Breast Pump;Manual Pump Education: Setup, frequency, and cleaning;Milk Storage Reason for Pumping: LPI twins < 6 lb Pumping frequency: q 3 hours Pumped volume: 74  mL  Interventions Interventions: Breast feeding basics reviewed;Education;DEBP;Hand pump  Discharge Discharge Education: Engorgement and breast care;Warning signs for feeding baby;Outpatient recommendation Pump: DEBP;Personal  Consult Status Consult Status: Complete Date: 12/05/20 Follow-up type: Call as needed    Eddy Liszewski Venetia Constable 12/05/2020, 12:33 PM

## 2020-12-11 ENCOUNTER — Encounter (HOSPITAL_COMMUNITY)
Admission: RE | Admit: 2020-12-11 | Discharge: 2020-12-11 | Disposition: A | Discharge status: Home / Self Care | Payer: Medicaid Other | Source: Ambulatory Visit | Attending: Obstetrics & Gynecology | Admitting: Obstetrics & Gynecology

## 2020-12-11 ENCOUNTER — Other Ambulatory Visit (HOSPITAL_COMMUNITY): Payer: Medicaid Other

## 2020-12-11 NOTE — Anesthesia Postprocedure Evaluation (Signed)
Anesthesia Post Note  Patient: Venola Castello  Procedure(s) Performed: AN AD HOC LABOR EPIDURAL     Patient location during evaluation: Mother Baby Anesthesia Type: Epidural Level of consciousness: awake and alert and oriented Pain management: pain level controlled Vital Signs Assessment: post-procedure vital signs reviewed and stable Respiratory status: spontaneous breathing, nonlabored ventilation and respiratory function stable Cardiovascular status: blood pressure returned to baseline and stable Postop Assessment: no headache, no backache, patient able to bend at knees, no apparent nausea or vomiting and able to ambulate Anesthetic complications: no   No complications documented.  Last Vitals: There were no vitals filed for this visit.  Last Pain: There were no vitals filed for this visit. Pain Goal:                   Lannie Fields

## 2020-12-13 ENCOUNTER — Encounter (HOSPITAL_COMMUNITY): Admission: AD | Payer: Self-pay | Source: Home / Self Care

## 2020-12-13 ENCOUNTER — Inpatient Hospital Stay (HOSPITAL_COMMUNITY)
Admission: AD | Admit: 2020-12-13 | Payer: Medicaid Other | Source: Home / Self Care | Admitting: Obstetrics & Gynecology

## 2020-12-13 SURGERY — Surgical Case
Anesthesia: Regional

## 2020-12-26 ENCOUNTER — Telehealth: Payer: Self-pay | Admitting: Obstetrics and Gynecology

## 2020-12-26 NOTE — Telephone Encounter (Signed)
Received phone call from postpartum nurse who had visited the patient earlier today (no longer with patient). She called to report elevated blood pressure, diastolics in the 90s initially and on subsequent repeats. Symptoms not assessed. I advised presentation to the MAU for preE eval.

## 2021-01-07 ENCOUNTER — Encounter: Payer: Self-pay | Admitting: *Deleted

## 2021-01-24 ENCOUNTER — Ambulatory Visit (INDEPENDENT_AMBULATORY_CARE_PROVIDER_SITE_OTHER): Payer: Medicaid Other | Admitting: Obstetrics and Gynecology

## 2021-01-24 ENCOUNTER — Other Ambulatory Visit: Payer: Self-pay

## 2021-01-24 ENCOUNTER — Encounter: Payer: Self-pay | Admitting: Obstetrics and Gynecology

## 2021-01-24 VITALS — BP 130/76 | HR 80 | Wt 240.9 lb

## 2021-01-24 DIAGNOSIS — F419 Anxiety disorder, unspecified: Secondary | ICD-10-CM

## 2021-01-24 DIAGNOSIS — Z124 Encounter for screening for malignant neoplasm of cervix: Secondary | ICD-10-CM

## 2021-01-24 DIAGNOSIS — Z1331 Encounter for screening for depression: Secondary | ICD-10-CM

## 2021-01-24 DIAGNOSIS — F32A Depression, unspecified: Secondary | ICD-10-CM

## 2021-01-24 DIAGNOSIS — Z9851 Tubal ligation status: Secondary | ICD-10-CM

## 2021-01-24 DIAGNOSIS — Z98891 History of uterine scar from previous surgery: Secondary | ICD-10-CM | POA: Diagnosis not present

## 2021-01-24 NOTE — Progress Notes (Signed)
Post Partum Visit Note  Jodi Daniel is a 25 y.o. [id number] female who presents for a postpartum visit. She is 7 weeks postpartum following a vacuum-assisted vaginal delivery.  I have fully reviewed the prenatal and intrapartum course. The delivery was at 36.3 gestational weeks.  Anesthesia: epidural. Postpartum course has been uncomplicated. Babies are doing well. Baby is feeding by bottle Rush Barer soothe . Bleeding changing a maxi pad every 3 hours. Bowel function is normal. Bladder function is normal. Patient is not sexually active. Contraception method is tubal ligation. Postpartum depression screening: positive.  The pregnancy intention screening data noted above was reviewed. Potential methods of contraception were discussed. The patient elected to proceed with No data recorded.   Edinburgh Postnatal Depression Scale - 01/24/21 1428       Edinburgh Postnatal Depression Scale:  In the Past 7 Days   I have been able to laugh and see the funny side of things. 0    I have looked forward with enjoyment to things. 0    I have blamed myself unnecessarily when things went wrong. 3    I have been anxious or worried for no good reason. 0    I have felt scared or panicky for no good reason. 0    Things have been getting on top of me. 2    I have been so unhappy that I have had difficulty sleeping. 2    I have felt sad or miserable. 2    I have been so unhappy that I have been crying. 2    The thought of harming myself has occurred to me. 0    Edinburgh Postnatal Depression Scale Total 11             Health Maintenance Due  Topic Date Due   COVID-19 Vaccine (1) Never done   PAP-Cervical Cytology Screening  01/25/2021   PAP SMEAR-Modifier  01/25/2021    The following portions of the patient's history were reviewed and updated as appropriate: allergies, current medications, past family history, past medical history, past social history, past surgical history, and problem  list.  Review of Systems Pertinent items are noted in HPI.  Objective:  LMP 03/22/2020 (Approximate)    General:  alert, cooperative, and appears stated age   Breasts:  deferred  Lungs: Normal WOB  Heart:  Regular rate  Abdomen: Soft, nontender  Wound well approximated incision, no surrounding erythema or tenderness  GU exam:   deferred       Assessment:   Jodi Daniel is a 24 y.o. [id number] female who presents for a postpartum visit. She is 7 weeks postpartum following a vacuum-assisted vaginal delivery and postpartum BTL. No concerns today. Pt with positive depression screen but no safety concerns. Declined BH referral.  Normal postpartum exam.   Plan:   Essential components of care per ACOG recommendations:  1.  Mood and well being: Patient with positive depression screening today. Reviewed local resources for support. Provided BH resources. Pt declined BH referral today. Strict return precautions for safety concerns. Reassuringly, pt with good support at home. Pt has also returned to work on the weekends, and feels that being out of the house has improved her mood. - Patient tobacco use? No.   - hx of drug use? No.    2. Infant care and feeding:  -Patient currently breastmilk feeding? No. Recently switched to formula. -Social determinants of health (SDOH) reviewed in EPIC. No concerns reported today.  3. Sexuality, contraception  and birth spacing - Patient does not want a pregnancy in the next year.   - S/p postpartum BTL. No post-op concerns.  4. Sleep and fatigue -Encouraged family/partner/community support of 4 hrs of uninterrupted sleep to help with mood and fatigue  5. Physical Recovery  - Discussed patients delivery and complications. She describes her labor as good. (VBAC). - Patient had a Vaginal, no problems at delivery. No lacerations. Perineal healing reviewed. Patient expressed understanding - Patient has urinary incontinence? No. - Patient is safe to  resume physical and sexual activity  6.  Health Maintenance - HM due items addressed: no current chronic medical conditions - Last pap smear negative on 01/25/18--pt elected to RTC in 2 weeks for f/u pap given her older daughter is present at the visit today. Diagnosis  Date Value Ref Range Status  01/25/2018   Final   NEGATIVE FOR INTRAEPITHELIAL LESIONS OR MALIGNANCY.   -Breast Cancer screening indicated? N/a  7. Chronic Disease/Pregnancy Condition follow up: as needed  RTC in 2 weeks for f/u pap per pt preference.  Sheila Oats, MD OB Fellow, Faculty [facility] 01/27/2021 10:05 PM

## 2021-01-24 NOTE — Patient Instructions (Signed)
Psychiatric Services °[facility]  °[address], [place], [place] °[phone] ° °[facility] °[address], [place], [place] °Helpline 336-832-9700 or 1-800-711-2635 °[email or web address] ° °[facility] °Walk-in Mon-Fri, 8:30-5:00 °[address], [place], [place] °[phone] °[email or web address]  °*Bring your own interpreter at 1st visit ° °[facility] °[address], [address], [place], [place] °[phone] °[email or web address]  ° °[facility]  °[address], [place], [place] °[phone] ° °[facility] °Walk-in Mon-Fri, 8am-3pm °[address], [place], [place] °[phone] °www.[email or web address] ° °Therapy/Counseling ° °[facility] °[address], [place], [place] °[phone] ° °[facility] °[address], [place], [place] °[phone] ° °[facility] °[address], [place], [place] °[phone] °*pacientes que hablen espanol, favor comunicarse con el Sr[address] [place], extension 2244 o la [address], extension 3331 para hacer una cita.  ° °[facility] °[address] [place]” °[phone] (Habla Espanol) ° °[facility] °[address], [place], [place] °[phone] ° °[facility] °[address], [place], [place] °[phone] °  °[facility]  °[address], [place], [place] °[phone] °[email or web address]  °*Uninsured and underinsured, ages 19-64 ° °[facility] °[address], [place], [place] °[phone] (Habla Espanol) ° °[facility] °[address], [place], [place] °[phone] (Habla Espanol) ° °[facility] °[address], [place], [place] °[phone] (Habla  Espanol) ° °[facility] °Mon-Thurs 8:30am-8:00pm/ Fri 8:30-7:00pm °[address], [place], [place] °(3rd floor, located at corner of [address]) °Call [phone] to schedule an appointment °[email or web address] ° °[facility] °[address], [place], [place]  °[phone] ° °[facility]  °[address], [place], [place] °336-375-8333 ° °Social Support °[facility] °(336) 373-1402 or www.[facility].org °[address], [address], [place], [place] [address] °* Recovery support and educational programs, including recovery skills classes, support groups, and one-on-one sessions with [facility] Certified Peer Support Specialists.  ° ° °[facility] ([facility] [place] °[facility] Helpline: (336) 370-4264 °* Family and Friends Support Group/  Contact Jack Glenn at [phone] for more information °* Family to Family Education Program and Basics Class : enroll online or email Mary Pennington at [email or web address]  °* Monthly educational meetings, contact Mitch McGee at [phone] °[email or web address]  ° ° °24- Hour Availability:  °*[facility] 336-832-9700 or 1-800-711-2635 ° °* [facility] Crisis (Domestic Violence, Rape, Victim Assistance) Line 336-273-7273 ° °* [facility] 1-855-788-8787 or 336-676-6840 ° °* [facility] [place] [facility]  °336-899-1505(8am-4pm only) °1-866-261-5769(after hours) ° °*[facility] °1-877-626-1772 ° °*[place] National Suicide Hotline °1-800-273-8255 ° ° °

## 2021-01-27 ENCOUNTER — Encounter: Payer: Self-pay | Admitting: Obstetrics and Gynecology

## 2021-01-27 DIAGNOSIS — Z1331 Encounter for screening for depression: Secondary | ICD-10-CM | POA: Insufficient documentation

## 2021-02-11 ENCOUNTER — Ambulatory Visit: Payer: Medicaid Other | Admitting: Obstetrics and Gynecology

## 2021-02-11 ENCOUNTER — Encounter: Payer: Self-pay | Admitting: Obstetrics and Gynecology

## 2021-02-15 NOTE — Progress Notes (Signed)
Patient did not keep her pap smear appointment for 02/11/2021.  Cornelia Copa MD Attending [facility] [employer] [facility])

## 2021-04-04 ENCOUNTER — Ambulatory Visit: Payer: Medicaid Other | Admitting: Obstetrics and Gynecology

## 2022-12-24 IMAGING — US US MFM OB TRANSVAGINAL
1 series · 13 of 28 positions shown · non-contrast
Comparison: none

[Series 1: us mfm ob transvaginal · 39 acquisitions, 13 frames shown]
[im 2/39]
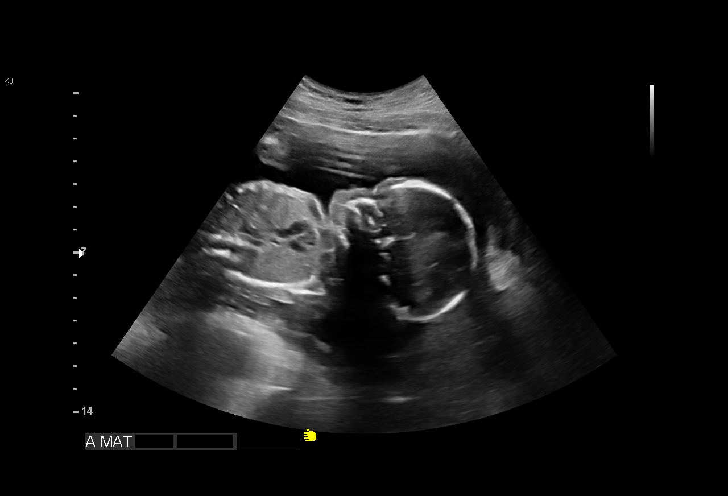
[im 5/39]
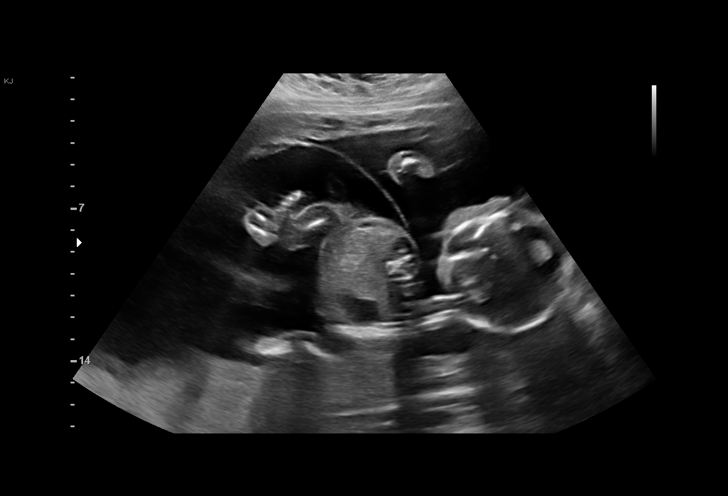
[im 8/39]
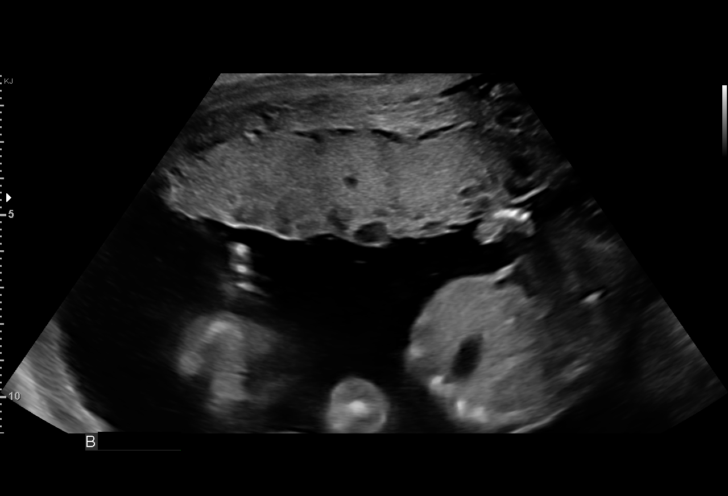
[im 10/39]
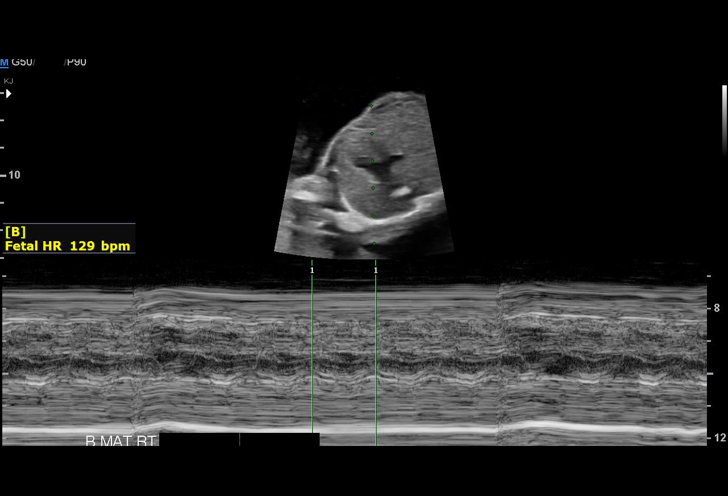
[im 13/39]
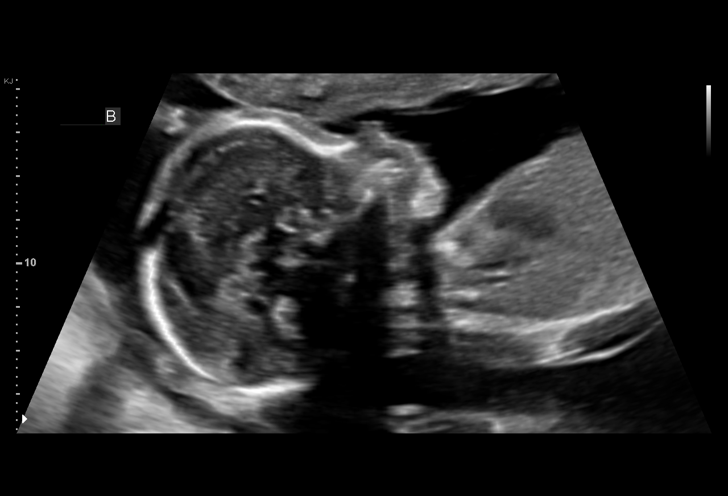
[im 16/39]
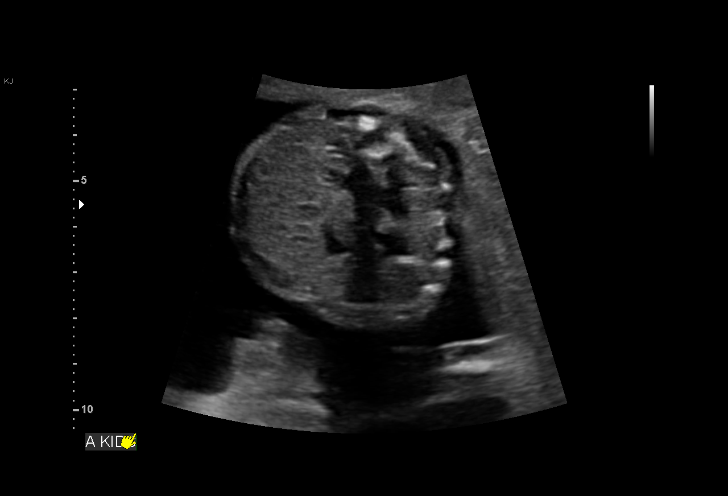
[im 20/39]
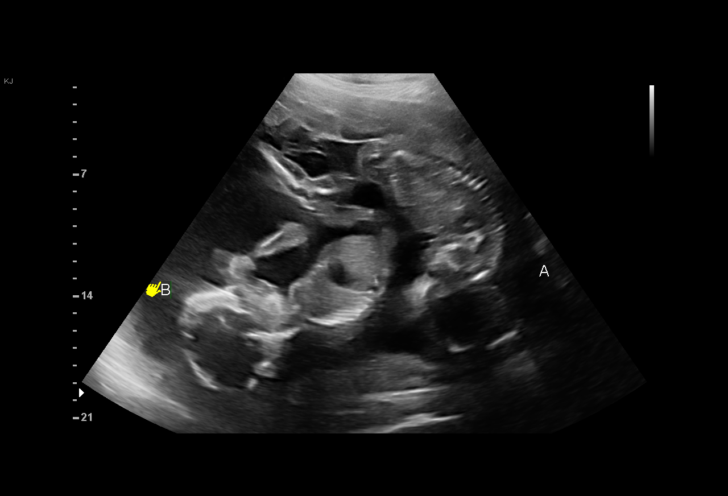
[im 23/39]
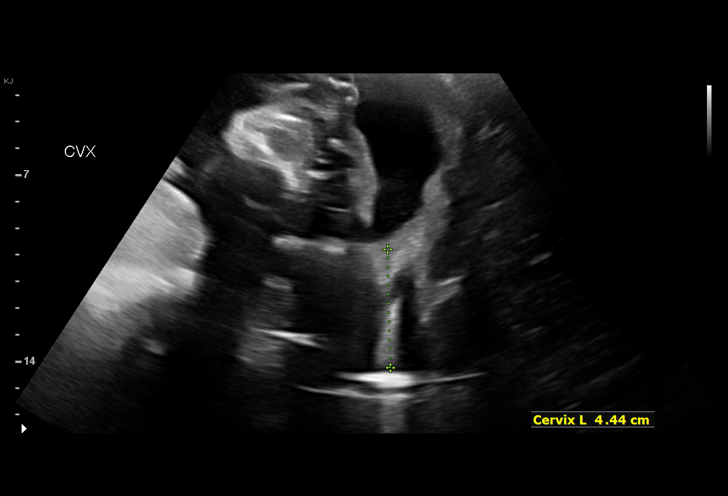
[im 26/39]
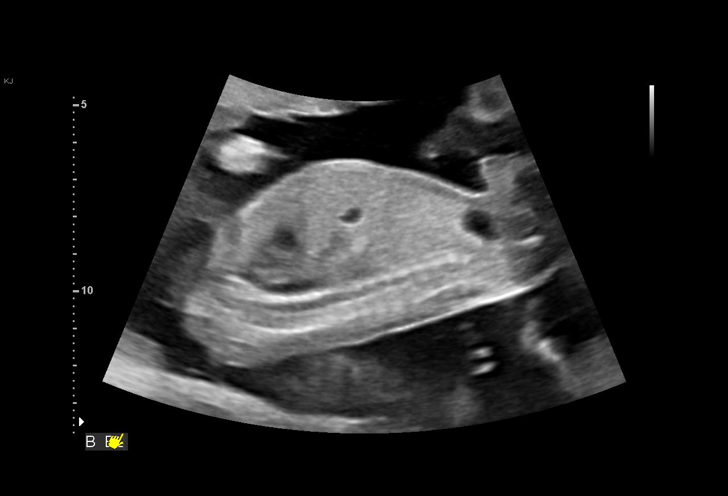
[im 29/39]
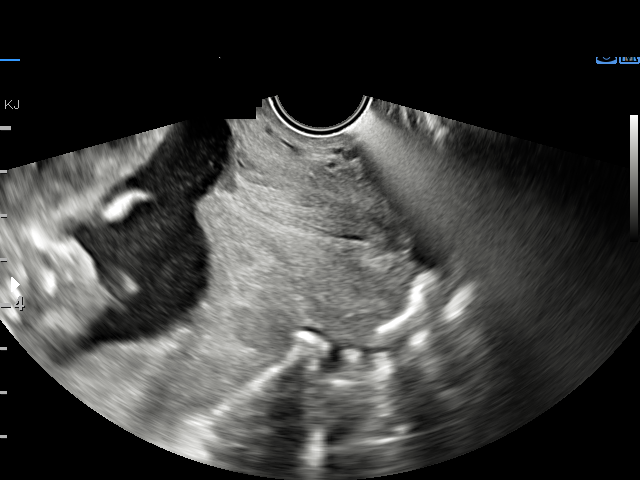
[im 31/39]
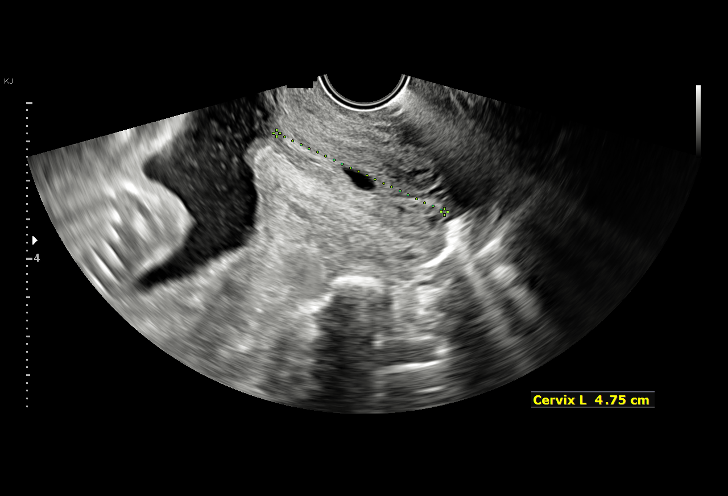
[im 34/39]
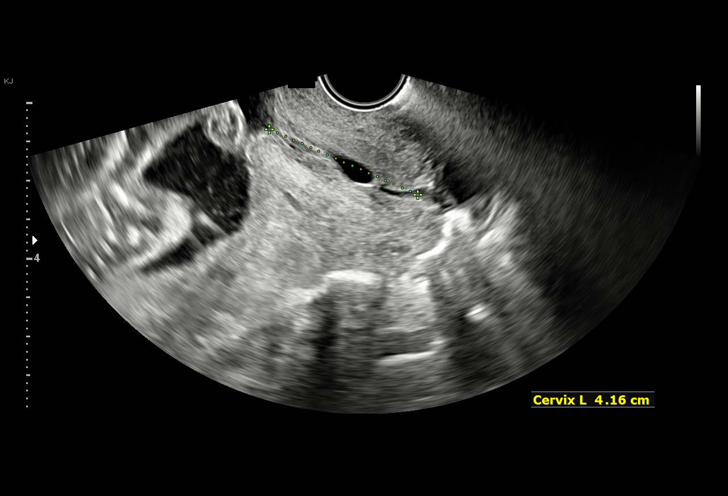
[im 37/39]
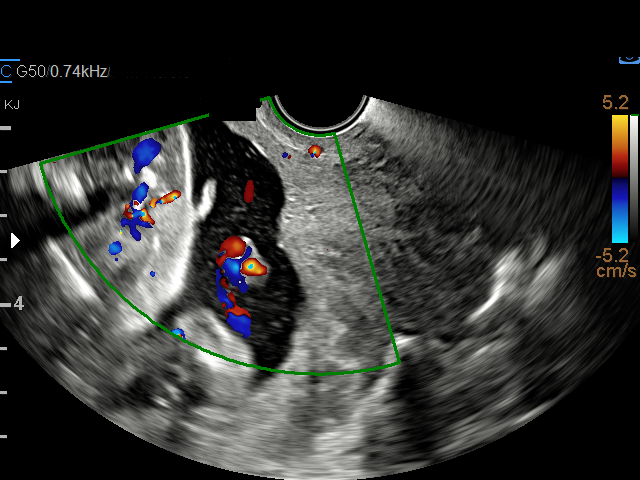

[13 of 28 positions shown; findings below may reference images not displayed]

Indications

 Twin pregnancy, di/di, second trimester
 Encounter for antenatal screening for
 malformations
 Low risk NIPS, 4.ITT,
 Previous cesarean delivery, antepartum x1
 Poor obstetric history: Previous preterm
 delivery, antepartum (G4LY7)
 History of preterm delivery
 20 weeks gestation of pregnancy
Fetal Evaluation (Fetus A)

 Num Of Fetuses:         2
 Fetal Heart Rate(bpm):  140
 Cardiac Activity:       Observed
 Fetal Lie:              Lower Fetus, Maternal Left
 Presentation:           Cephalic
 Placenta:               Posterior
 P. Cord Insertion:      Previously Visualized
 Membrane Desc:      Dividing Membrane seen - Dichorionic.

 Amniotic Fluid
 AFI FV:      Within normal limits

                             Largest Pocket(cm)

Biometry (Fetus A)

 LV:        4.9  mm
OB History

 Gravidity:    5         Term:   1        Prem:   1        SAB:   2
 TOP:          0       Ectopic:  0        Living: 2
Gestational Age (Fetus A)

 LMP:           21w 0d        Date:  03/22/20                 EDD:   12/27/20
 Best:          20w 5d     Det. By:  Early Ultrasound         EDD:   12/29/20
                                     (05/15/20)
Anatomy (Fetus A)

 Cavum:                 Not well visualized    Cord Vessels:           Not well visualized
 Ventricles:            Appears normal         Kidneys:                Appear normal
 Palate:                Not well visualized    Bladder:                Appears normal
 Stomach:               Appears normal, left
                        sided

 Other:  Fetus appears to be female. Other anatomy has been previously
         seen as normal.

Fetal Evaluation (Fetus B)

 Num Of Fetuses:         2
 Fetal Heart Rate(bpm):  129
 Cardiac Activity:       Observed
 Fetal Lie:              Upper Fetus, Maternal right
 Presentation:           Transverse, head to maternal left
 Placenta:               Anterior
 P. Cord Insertion:      Previously Visualized
 Membrane Desc:      Dividing Membrane seen - Dichorionic.

 Amniotic Fluid
 AFI FV:      Within normal limits

                             Largest Pocket(cm)

Biometry (Fetus B)

 LV:        4.2  mm
Gestational Age (Fetus B)

 LMP:           21w 0d        Date:  03/22/20                 EDD:   12/27/20
 Best:          20w 5d     Det. By:  Early Ultrasound         EDD:   12/29/20
                                     (05/15/20)
Anatomy (Fetus B)
 Ventricles:            Appears normal         Ductal Arch:            Not well visualized
 Face:                  Profile not well       Stomach:                Appears normal, left
                        visualized                                     sided
 RVOT:                  Not well visualized    Kidneys:                Appear normal
 Aortic Arch:           Not well visualized    Bladder:                Appears normal

 Other:  Fetus appears to be a male. Other anatomy has been previously
         seen as normal.
Cervix Uterus Adnexa

 Cervix
 Length:           4.25  cm.
 Normal appearance by transvaginal scan.  Appears closed, without
 funnelling.
Comments

 This patient was seen for a cervical length measurement in
 this spontaneously conceived dichorionic, diamniotic twin
 pregnancy.  She reports that in her prior pregnancy, her
 cervix was about 1 cm dilated for the duration of her
 pregnancy.  She has declined intramuscular progesterone in
 this pregnancy.

 A limited ultrasound performed today showed normal
 amniotic fluid around twin A and twin B.

 A transvaginal ultrasound performed today shows a cervical
 length of 4.25 cm long without any signs of funneling.  The
 patient was reassured that her risk of a preterm birth is low at
 this time.

 She will return in 2 weeks for another ultrasound to assess
 the fetal growths.
